# Patient Record
Sex: Male | Born: 1935
Health system: Southern US, Community
[De-identification: ages and names within clinical notes are randomized; demographics above are authoritative.]

## PROBLEM LIST (undated history)

## (undated) DIAGNOSIS — N401 Enlarged prostate with lower urinary tract symptoms: Secondary | ICD-10-CM

## (undated) DIAGNOSIS — C801 Malignant (primary) neoplasm, unspecified: Secondary | ICD-10-CM

## (undated) DIAGNOSIS — Z87442 Personal history of urinary calculi: Secondary | ICD-10-CM

## (undated) DIAGNOSIS — E785 Hyperlipidemia, unspecified: Secondary | ICD-10-CM

## (undated) DIAGNOSIS — R338 Other retention of urine: Secondary | ICD-10-CM

## (undated) DIAGNOSIS — M1990 Unspecified osteoarthritis, unspecified site: Secondary | ICD-10-CM

## (undated) DIAGNOSIS — I10 Essential (primary) hypertension: Secondary | ICD-10-CM

## (undated) DIAGNOSIS — N189 Chronic kidney disease, unspecified: Secondary | ICD-10-CM

## (undated) HISTORY — PX: EYE SURGERY: SHX253

## (undated) HISTORY — PX: TONSILLECTOMY: SUR1361

## (undated) HISTORY — PX: SKIN CANCER EXCISION: SHX779

---

## 2002-09-24 ENCOUNTER — Ambulatory Visit (HOSPITAL_COMMUNITY): Admission: RE | Admit: 2002-09-24 | Discharge: 2002-09-24 | Payer: Self-pay | Admitting: Gastroenterology

## 2002-09-24 ENCOUNTER — Encounter (INDEPENDENT_AMBULATORY_CARE_PROVIDER_SITE_OTHER): Payer: Self-pay | Admitting: Specialist

## 2005-02-10 ENCOUNTER — Emergency Department (HOSPITAL_COMMUNITY): Admission: EM | Admit: 2005-02-10 | Discharge: 2005-02-10 | Payer: Self-pay | Admitting: Emergency Medicine

## 2005-09-18 ENCOUNTER — Emergency Department (HOSPITAL_COMMUNITY): Admission: EM | Admit: 2005-09-18 | Discharge: 2005-09-18 | Payer: Self-pay | Admitting: Emergency Medicine

## 2005-09-22 ENCOUNTER — Emergency Department (HOSPITAL_COMMUNITY): Admission: EM | Admit: 2005-09-22 | Discharge: 2005-09-22 | Payer: Self-pay | Admitting: Emergency Medicine

## 2005-09-24 ENCOUNTER — Ambulatory Visit (HOSPITAL_COMMUNITY): Admission: RE | Admit: 2005-09-24 | Discharge: 2005-09-24 | Payer: Self-pay | Admitting: Urology

## 2005-11-17 ENCOUNTER — Emergency Department (HOSPITAL_COMMUNITY): Admission: EM | Admit: 2005-11-17 | Discharge: 2005-11-17 | Payer: Self-pay | Admitting: Emergency Medicine

## 2005-11-20 ENCOUNTER — Ambulatory Visit (HOSPITAL_BASED_OUTPATIENT_CLINIC_OR_DEPARTMENT_OTHER): Admission: RE | Admit: 2005-11-20 | Discharge: 2005-11-20 | Payer: Self-pay | Admitting: Urology

## 2006-10-23 ENCOUNTER — Observation Stay (HOSPITAL_COMMUNITY): Admission: EM | Admit: 2006-10-23 | Discharge: 2006-10-24 | Payer: Self-pay | Admitting: Emergency Medicine

## 2006-10-23 ENCOUNTER — Encounter (INDEPENDENT_AMBULATORY_CARE_PROVIDER_SITE_OTHER): Payer: Self-pay | Admitting: General Surgery

## 2008-05-27 ENCOUNTER — Emergency Department (HOSPITAL_COMMUNITY): Admission: EM | Admit: 2008-05-27 | Discharge: 2008-05-27 | Payer: Self-pay | Admitting: Emergency Medicine

## 2008-12-12 ENCOUNTER — Other Ambulatory Visit: Payer: Self-pay | Admitting: Internal Medicine

## 2008-12-12 ENCOUNTER — Other Ambulatory Visit: Payer: Self-pay | Admitting: Cardiology

## 2008-12-12 ENCOUNTER — Inpatient Hospital Stay (HOSPITAL_COMMUNITY): Admission: EM | Admit: 2008-12-12 | Discharge: 2008-12-14 | Payer: Self-pay | Admitting: Emergency Medicine

## 2008-12-13 ENCOUNTER — Other Ambulatory Visit: Payer: Self-pay | Admitting: Internal Medicine

## 2008-12-14 ENCOUNTER — Other Ambulatory Visit: Payer: Self-pay | Admitting: Internal Medicine

## 2010-07-07 LAB — CK TOTAL AND CKMB (NOT AT ARMC)
CK, MB: 1.1 ng/mL (ref 0.3–4.0)
Relative Index: INVALID (ref 0.0–2.5)
Total CK: 46 U/L (ref 7–232)

## 2010-07-07 LAB — CBC
HCT: 45.2 % (ref 39.0–52.0)
HCT: 48.9 % (ref 39.0–52.0)
Hemoglobin: 16.9 g/dL (ref 13.0–17.0)
Hemoglobin: 15 g/dL (ref 13.0–17.0)
Hemoglobin: 15.6 g/dL (ref 13.0–17.0)
MCHC: 34.5 g/dL (ref 30.0–36.0)
MCHC: 34.3 g/dL (ref 30.0–36.0)
MCV: 89 fL (ref 78.0–100.0)
Platelets: 186 10*3/uL (ref 150–400)
Platelets: 209 10*3/uL (ref 150–400)
RBC: 5.12 MIL/uL (ref 4.22–5.81)
RBC: 5.5 MIL/uL (ref 4.22–5.81)
RBC: 4.88 MIL/uL (ref 4.22–5.81)
RBC: 5.05 MIL/uL (ref 4.22–5.81)
RDW: 13.7 % (ref 11.5–15.5)
RDW: 13.8 % (ref 11.5–15.5)
WBC: 10.3 10*3/uL (ref 4.0–10.5)
WBC: 12.1 10*3/uL — ABNORMAL HIGH (ref 4.0–10.5)

## 2010-07-07 LAB — DIFFERENTIAL
Basophils Absolute: 0.1 10*3/uL (ref 0.0–0.1)
Basophils Absolute: 0 10*3/uL (ref 0.0–0.1)
Basophils Relative: 1 % (ref 0–1)
Basophils Relative: 0 % (ref 0–1)
Eosinophils Absolute: 0.2 10*3/uL (ref 0.0–0.7)
Eosinophils Absolute: 0 10*3/uL (ref 0.0–0.7)
Eosinophils Relative: 2 % (ref 0–5)
Eosinophils Relative: 0 % (ref 0–5)
Lymphocytes Relative: 42 % (ref 12–46)
Lymphs Abs: 4.3 10*3/uL — ABNORMAL HIGH (ref 0.7–4.0)
Lymphs Abs: 2.4 10*3/uL (ref 0.7–4.0)
Monocytes Absolute: 0.9 10*3/uL (ref 0.1–1.0)
Monocytes Relative: 9 % (ref 3–12)
Neutro Abs: 4.8 10*3/uL (ref 1.7–7.7)
Neutrophils Relative %: 47 % (ref 43–77)
Neutrophils Relative %: 74 % (ref 43–77)

## 2010-07-07 LAB — POCT I-STAT, CHEM 8
BUN: 15 mg/dL (ref 6–23)
Calcium, Ion: 1.08 mmol/L — ABNORMAL LOW (ref 1.12–1.32)
Chloride: 108 mEq/L (ref 96–112)
Creatinine, Ser: 1 mg/dL (ref 0.4–1.5)
Glucose, Bld: 101 mg/dL — ABNORMAL HIGH (ref 70–99)
HCT: 51 % (ref 39.0–52.0)
Hemoglobin: 17.3 g/dL — ABNORMAL HIGH (ref 13.0–17.0)
Potassium: 3.8 mEq/L (ref 3.5–5.1)
Sodium: 139 mEq/L (ref 135–145)
TCO2: 21 mmol/L (ref 0–100)

## 2010-07-07 LAB — COMPREHENSIVE METABOLIC PANEL
ALT: 29 U/L (ref 0–53)
AST: 40 U/L — ABNORMAL HIGH (ref 0–37)
Alkaline Phosphatase: 80 U/L (ref 39–117)
CO2: 22 mEq/L (ref 19–32)
Calcium: 8.9 mg/dL (ref 8.4–10.5)
Chloride: 106 mEq/L (ref 96–112)
GFR calc Af Amer: >60 mL/min (ref >60)
GFR calc non Af Amer: >60 mL/min (ref >60)
Glucose, Bld: 115 mg/dL — ABNORMAL HIGH (ref 70–99)
Sodium: 137 mEq/L (ref 135–145)
Total Bilirubin: 1.3 mg/dL — ABNORMAL HIGH (ref 0.3–1.2)

## 2010-07-07 LAB — PROTIME-INR
INR: 1.1 (ref 0.00–1.49)
INR: 1.1 (ref 0.00–1.49)
Prothrombin Time: 13.9 seconds (ref 11.6–15.2)
Prothrombin Time: 14.4 seconds (ref 11.6–15.2)

## 2010-07-07 LAB — LIPID PANEL
Cholesterol: 158 mg/dL (ref 0–200)
Cholesterol: 159 mg/dL (ref 0–200)
HDL: 36 mg/dL — ABNORMAL LOW (ref >39)
Total CHOL/HDL Ratio: 3.9 RATIO
Triglycerides: 118 mg/dL (ref <150)

## 2010-07-07 LAB — BASIC METABOLIC PANEL
CO2: 25 mEq/L (ref 19–32)
Calcium: 9.1 mg/dL (ref 8.4–10.5)
Chloride: 111 mEq/L (ref 96–112)
Creatinine, Ser: 1.01 mg/dL (ref 0.4–1.5)
GFR calc Af Amer: >60 mL/min (ref >60)
GFR calc Af Amer: >60 mL/min (ref >60)
GFR calc non Af Amer: >60 mL/min (ref >60)
Glucose, Bld: 111 mg/dL — ABNORMAL HIGH (ref 70–99)
Sodium: 139 mEq/L (ref 135–145)
Sodium: 137 mEq/L (ref 135–145)

## 2010-07-07 LAB — POCT CARDIAC MARKERS
CKMB, poc: <1 ng/mL — ABNORMAL LOW (ref 1.0–8.0)
Myoglobin, poc: 60.9 ng/mL (ref 12–200)
Troponin i, poc: <0.05 ng/mL (ref 0.00–0.09)

## 2010-07-07 LAB — CARDIAC PANEL(CRET KIN+CKTOT+MB+TROPI)
Relative Index: INVALID (ref 0.0–2.5)
Total CK: 42 U/L (ref 7–232)
Troponin I: 0.16 ng/mL — ABNORMAL HIGH (ref 0.00–0.06)

## 2010-07-07 LAB — TSH: TSH: 4.851 u[IU]/mL — ABNORMAL HIGH (ref 0.350–4.500)

## 2010-07-07 LAB — HEPARIN LEVEL (UNFRACTIONATED): Heparin Unfractionated: 0.56 IU/mL (ref 0.30–0.70)

## 2010-07-07 LAB — APTT: aPTT: 28 seconds (ref 24–37)

## 2010-07-07 LAB — TROPONIN I: Troponin I: 0.13 ng/mL — ABNORMAL HIGH (ref 0.00–0.06)

## 2010-07-07 LAB — BRAIN NATRIURETIC PEPTIDE: Pro B Natriuretic peptide (BNP): 52 pg/mL (ref 0.0–100.0)

## 2010-07-07 LAB — MAGNESIUM: Magnesium: 2 mg/dL (ref 1.5–2.5)

## 2010-07-18 LAB — URINE CULTURE: Colony Count: NO GROWTH

## 2010-07-18 LAB — URINALYSIS, ROUTINE W REFLEX MICROSCOPIC
Nitrite: NEGATIVE
Protein, ur: 100 mg/dL — AB
pH: 5 (ref 5.0–8.0)

## 2010-07-18 LAB — URINE MICROSCOPIC-ADD ON

## 2010-08-15 NOTE — H&P (Signed)
Glenn Patel, Glenn Patel                ACCOUNT NO.:  [id number]   MEDICAL RECORD NO.:  [id number]          PATIENT TYPE:  INP   LOCATION:  0098                         FACILITY:  [facility]   PHYSICIAN:  Angelia Mould. Derrell Lolling, M.D.DATE OF BIRTH:  1933-10-22   DATE OF ADMISSION:  10/23/2006  DATE OF DISCHARGE:                              HISTORY & PHYSICAL   CHIEF COMPLAINT:  Right-sided abdominal pain.   HISTORY OF PRESENT ILLNESS:  This is a 77 year old white man in  generally good health.  He presents to the emergency room with a 3-day  history of steady right-sided abdominal pain and nausea.  He has not had  any vomiting.  He denies fever or chills.  He denies any prior problems.  His bowel movements have been basically normal although he had one small  looser stool today with no bloody in the stools.  He denies prior  similar problems.   It is notable that in 2007 he had a CT urogram which showed an  obstructing right ureteral stone, but also was noted to have gallstones  at that time.  He had an ultrasound in the ER today which shows  gallstones, but there is no significant wall thickening, no free fluid  around the gallbladder and the common bile duct is normal at 6 mm.  I  was asked to see him by Dr. Carleene Cooper with concern for acute  cholecystitis.   PAST HISTORY:  1. Right ureteral kidney stone requiring ureteroscopy by Dr. Larey Dresser in 2007.  2. Colonoscopy 2004 and 2007 for colon polyps by Dr. Wandalee Ferdinand.  3. Tonsillectomy remote.  4. No other medical or surgical problems.   CURRENT MEDICATIONS:  None.   DRUG ALLERGIES:  None known.   FAMILY HISTORY:  Father died age 25 sudden death.  Mother died age 56  colon cancer.   SOCIAL HISTORY:  The patient is married, lives in [place], has three  children.  He is retired.  He used to be the owner of [employer].  He still smokes about one-half pack of cigarettes per day.  Drinks  alcohol once a week.   REVIEW  OF SYSTEMS:  A 15 system review of systems is performed and is  noncontributory except as described above.   PHYSICAL EXAMINATION:  GENERAL APPEARANCE:  Pleasant older gentleman in  mild distress.  He has received intravenous analgesics.  VITAL SIGNS:  Temperature 97.7, pulse 77, respirations 20, blood  pressure 140/97.  EYES:  Sclerae clear.  Extraocular movements intact.  EARS, NOSE, MOUTH, AND THROAT:  Nose, lips, tongue and oropharynx are  without gross lesions.  NECK:  Supple, nontender.  I do not feel a mass.  I do not see jugular  venous distention.  CHEST:  Lungs clear to auscultation.  No chest wall tenderness.  No  chest wall tenderness.  Specifically he is nontender to percuss the  right costal margin.  HEART:  Regular rate and rhythm.  No murmurs.  ABDOMEN:  Borderline obese, soft.  He is tender on the right side.  It  actually seems a little bit lower than the typical gallbladder pain,  could be a low-lying gallbladder or could be appendicitis.  I do not  feel a mass.  He is tender to percuss.  Liver and spleen are not  enlarged.  I do not see any scars or hernias.  GENITOURINARY:  Penis,  scrotum, and testes are grossly normal.  I do not feel any inguinal  hernia.  EXTREMITIES:  He moves all four extremities well without pain or  deformity.  NEUROLOGIC: No gross motor or sensory deficits.   ADMISSION DATA:  Ultrasound done today as described above.  CT scan done  more than a year ago as described above.  Hemoglobin 16.0, white blood  cell count 9600, white count differential is actually normal with 61  neutrophils, 29 lymphocytes and 8 monocytes.  Complete metabolic panel,  specifically all liver function tests are normal.  Serum lipase is 21.  Urinalysis is normal.   ASSESSMENT:  Right-sided abdominal pain.  It is not clear whether this  is acute cholecystitis or some other inflammatory process such as  appendicitis.  The location of his tenderness is ambiguous and  the  ultrasound although it shows gallstones is not impressive for an  inflammatory process.   PLAN:  1. The patient will be admitted and started on intravenous      antibiotics.  2. We will proceed with a CT scan of the abdomen and pelvis now to see      if we can differentiate what is going on.  I suspect that he will      eventually come to the laparoscopy.      Angelia Mould. Derrell Lolling, M.D.  Electronically Signed     HMI/MEDQ  D:  10/23/2006  T:  10/24/2006  Job:  [id number]   cc:   Oley Balm. Georgina Pillion, M.D.  Fax: [phone]

## 2010-08-15 NOTE — Op Note (Signed)
NAMEODEAN, FESTER                ACCOUNT NO.:  [id number]   MEDICAL RECORD NO.:  [id number]          PATIENT TYPE:  OBV   LOCATION:  0098                         FACILITY:  [facility]   PHYSICIAN:  Angelia Mould. Derrell Lolling, M.D.DATE OF BIRTH:  January 10, 1934   DATE OF PROCEDURE:  10/23/2006  DATE OF DISCHARGE:  10/24/2006                               OPERATIVE REPORT   PREOPERATIVE DIAGNOSIS:  Acute appendicitis.   POSTOPERATIVE DIAGNOSIS:  Acute appendicitis.   OPERATION PERFORMED:  Laparoscopic appendectomy.   SURGEON:  Dr. Claud Kelp.   OPERATIVE INDICATIONS:  This is a 77 year old white man who presented to  the [facility] emergency room complaining a three-day history of steady  right-sided abdominal pain and nausea.  There were no voiding symptoms  and no alterations of his bowel habits and no fever.  He was known to  have gallstones from a CT scan 1 year ago but has not had any symptoms  of biliary colic in the past.  An ultrasound was done and it confirmed  the gallstones but there was no real inflammatory process.  I was called  to see him at that point and I felt that his pain was more in the right  lower quadrant.  A CT scan was then obtained which confirmed acute  appendicitis but no evidence of rupture.  He is brought to operating  room with preoperative diagnosis of acute appendicitis.   OPERATIVE FINDINGS:  The patient had acute appendicitis.  The appendix  came off the cecum medially and extended down toward the pelvis but was  mobilized just by breaking up some soft acute adhesions.  We could see  the terminal ileum and ileocecal valve and the right colon quite well.  Sigmoid colon was large and floppy and got in the way several times but  we were able to mobilize this out of the way.  The liver looked normal.  I really could not see the gallbladder as the omentum was chronically  adherent to the liver.  There did not appear to be any acute  inflammatory process in the  right upper quadrant.   OPERATIVE TECHNIQUE:  Following induction of general endotracheal  anesthesia, Foley catheter was inserted.  Intravenous antibiotics had  already been given.  The patient was identified as to correct patient  and correct procedure.  The abdomen was prepped and draped in a sterile  fashion.  0.5% Marcaine with epinephrine was used as local infiltration  anesthetic.  The short vertical incision was made inside the upper rim  of the umbilicus.  The fascia was incised in the midline.  The abdominal  cavity entered under direct vision.  A 10 mm Hasson trocar was inserted  and secured the pursestring suture of 0-0 Vicryl.  Pneumoperitoneum was  created.  Angled scope was inserted with visualization and findings as  described above.  The patient was positioned in Trendelenburg position  and rotated to the left.  A 5-mm trocar was placed in the left lower  quadrant and a 12-mm trocar placed in the suprapubic area.  We mobilized  the  sigmoid colon out of the way.  We ran the small bowel back to the  ileocecal valve.  We could see the base of the appendix and with slow  soft dissection we were able to bluntly mobilize the tip of the appendix  up and then we could see the appendix, the appendiceal mesentery and the  insertion of the appendix onto the cecum.  The appendiceal mesentery was  divided with harmonic scalpel.  We had some arterial bleeding from the  appendiceal mesentery which was controlled with a metal clips and it was  completely hemostatic for the next 20 minutes throughout the remainder  of the case.  It was completely hemostatic at the end of the case.  We  continued to skeletonize the appendiceal mesentery until we had it  completely taken away and could clearly see the junction of the appendix  with the cecum.  We had used an Endo-GIA stapler and placed it  perpendicular across the base of the appendix closed the stapler, held  in place for 30 minutes, fired  it and removed it.  We placed the  appendix in a bag and removed it.  We then irrigated out of the right  lower quadrant.  There was a little bit of blood left over from the  bleeding but there was no active bleeding after we irrigated all the  fluid and out.  The irrigation fluid was completely clear.  We inspected  the staple line and it looked very good.  We inspected the area where we  had controlled the appendiceal artery with the metal clip and that  looked good.  We felt nothing further needed to be done.  The  pneumoperitoneum was released.  The trocars were removed under direct  vision.  There was no bleeding from trocar sites.  The fascia at the  umbilicus was closed zero Vicryl sutures.  The skin incisions were  irrigated with saline and skin closed with subcuticular sutures of 4-0  Monocryl and Steri-Strips.  Clean bandages were placed and the patient  taken to recovery room in stable condition.  Estimated blood loss was  about 30 mL.  Complications none.  sponge, needle and instrument counts  were correct.      Angelia Mould. Derrell Lolling, M.D.  Electronically Signed     HMI/MEDQ  D:  10/23/2006  T:  10/24/2006  Job:  [id number]   cc:   Oley Balm. Georgina Pillion, M.D.  Fax: [phone]

## 2010-08-18 NOTE — Op Note (Signed)
NAMELEM, PEARY                ACCOUNT NO.:  [id number]   MEDICAL RECORD NO.:  [id number]          PATIENT TYPE:  AMB   LOCATION:  NESC                         FACILITY:  [facility]   PHYSICIAN:  Maretta Bees. Vonita Moss, M.D.DATE OF BIRTH:  03/03/1934   DATE OF PROCEDURE:  11/20/2005  DATE OF DISCHARGE:                                 OPERATIVE REPORT   PREOPERATIVE DIAGNOSIS:  Distal right ureteral calculus.   POSTOPERATIVE DIAGNOSIS:  Distal right ureteral calculus.   PROCEDURE:  Cystoscopy, right ureteroscopy and stone basketing.   SURGEON:  Dr. Larey Dresser.   ANESTHESIA:  General.   INDICATIONS:  This 77 year old gentleman has had right flank pain due to a 7  mm stone in the distal right ureter and underwent treatment with ESL earlier  this summer.  He passed several stone fragments and a KUB was done.  The  stone appeared gone but in retrospect there may have been a small fragment  over the bone to the coccyx.  In any case, he had recurrent flank pain.  A  CT scan in the emergency room showed a residual stone fragment in the distal  right ureter and because of pain and discomfort the patient wanted the stone  removed so he is brought to the OR today for ureteroscopy.   PROCEDURE:  The patient brought to the operating room, placed lithotomy  position.  External genitalia were prepped, draped in usual fashion.  Using  cystoscope, the anterior prostatic urethra were remarkable for prostatic  enlargement.  The bladder was unremarkable except for a bulging intramural  ureter.  A sensor wire was placed up the right ureter and under fluoroscopic  observation the stone moved up the ureter.  Because the orifice looked snug  I inserted the inner sheath of a ureteral access dilating sheath and dilated  the intramural ureter.  I then inserted a 6-French rigid ureteroscope and  visualized the stone which appeared to be definitely smaller than the  original stone treated with ESL.  I was  able to use the nitinol basket and  grasp the stone intact and remove it atraumatically and I later gave the  stone to the patient's wife.  I  reinserted the ureteroscope and the right  ureter was free residual stones.  Since the procedure was done essentially  atraumatically I opted not to leave in a double-J catheter.  He was then  taken to recovery room in good condition having tolerated the procedure  well.      Maretta Bees. Vonita Moss, M.D.  Electronically Signed     LJP/MEDQ  D:  11/20/2005  T:  11/21/2005  Job:  [id number]

## 2010-08-18 NOTE — Op Note (Signed)
   NAME:  Glenn Patel, Glenn Patel                          ACCOUNT NO.:  [id number]   MEDICAL RECORD NO.:  [id number]                   PATIENT TYPE:  AMB   LOCATION:  ENDO                                 FACILITY:  [facility]   PHYSICIAN:  Graylin Shiver, M.D.                DATE OF BIRTH:  Jul 07, 1933   DATE OF PROCEDURE:  09/24/2002  DATE OF DISCHARGE:                                 OPERATIVE REPORT   PROCEDURE:  Colonoscopy with polypectomy.   INDICATIONS FOR PROCEDURE:  Family history of colon polyps  possibly colon  cancer.   Informed consent was obtained after explanation of the risks of bleeding,  infection and perforation.   PREMEDICATION:  Fentanyl 50 mcg IV, Versed 4 mg IV.   DESCRIPTION OF PROCEDURE:  With the patient in the left lateral decubitus  position, a rectal exam was performed and no masses were felt. The Olympus  colonoscope was inserted into the rectum and advanced around the colon to  the cecum. Cecal landmarks were identified. The cecum looked normal. In the  mid ascending colon, there was a small 3 mm sessile polyp removed with cold  forceps. In the proximal transverse colon, there was a small 4 mm polyp  removed with the cold forceps. The descending colon was normal. In the  distal sigmoid, there was a 6 mm sessile polyp which was snared and removed  by snare cautery technique. The cautery site looked good, the polyp was  removed. The rectum looked normal. He tolerated the procedure well without  complications.   IMPRESSION:  Colon polyps.   PLAN:  The pathology will be checked. I would recommend a followup  colonoscopy again in three years.                                               Graylin Shiver, M.D.    SFG/MEDQ  D:  09/24/2002  T:  09/24/2002  Job:  [id number]   cc:   Oley Balm. Georgina Pillion, M.D.  [address]  [place]  [place] [address]  Fax: [phone]

## 2011-01-15 LAB — CBC
HCT: 46.2
Hemoglobin: 16
MCHC: 34.7
MCV: 84.8
Platelets: 206
RBC: 5.45
RDW: 14.9 — ABNORMAL HIGH
WBC: 9.6

## 2011-01-15 LAB — DIFFERENTIAL
Eosinophils Relative: 1
Lymphocytes Relative: 29
Monocytes Absolute: 0.7
Monocytes Relative: 8
Neutro Abs: 5.8

## 2011-01-15 LAB — URINALYSIS, ROUTINE W REFLEX MICROSCOPIC
Glucose, UA: NEGATIVE
Hgb urine dipstick: NEGATIVE
Specific Gravity, Urine: 1.029
pH: 5

## 2011-01-15 LAB — COMPREHENSIVE METABOLIC PANEL
AST: 24
Albumin: 3.7
BUN: 11
Creatinine, Ser: 1.18
GFR calc Af Amer: >60
Potassium: 3.8
Total Protein: 6.7

## 2011-01-15 LAB — LIPASE, BLOOD: Lipase: 21

## 2014-02-21 ENCOUNTER — Encounter (HOSPITAL_COMMUNITY): Payer: Self-pay | Admitting: *Deleted

## 2014-02-21 ENCOUNTER — Emergency Department (HOSPITAL_COMMUNITY): Payer: Medicare Other

## 2014-02-21 ENCOUNTER — Inpatient Hospital Stay (HOSPITAL_COMMUNITY)
Admission: EM | Admit: 2014-02-21 | Discharge: 2014-02-24 | DRG: 872 | Disposition: A | Discharge status: Home / Self Care | Payer: Medicare Other | Attending: Internal Medicine | Admitting: Internal Medicine

## 2014-02-21 DIAGNOSIS — E785 Hyperlipidemia, unspecified: Secondary | ICD-10-CM

## 2014-02-21 DIAGNOSIS — E876 Hypokalemia: Secondary | ICD-10-CM | POA: Diagnosis present

## 2014-02-21 DIAGNOSIS — D72829 Elevated white blood cell count, unspecified: Secondary | ICD-10-CM | POA: Diagnosis present

## 2014-02-21 DIAGNOSIS — Z79899 Other long term (current) drug therapy: Secondary | ICD-10-CM

## 2014-02-21 DIAGNOSIS — A419 Sepsis, unspecified organism: Principal | ICD-10-CM | POA: Diagnosis present

## 2014-02-21 DIAGNOSIS — I10 Essential (primary) hypertension: Secondary | ICD-10-CM | POA: Diagnosis present

## 2014-02-21 DIAGNOSIS — R339 Retention of urine, unspecified: Secondary | ICD-10-CM | POA: Diagnosis present

## 2014-02-21 DIAGNOSIS — B9620 Unspecified Escherichia coli [E. coli] as the cause of diseases classified elsewhere: Secondary | ICD-10-CM | POA: Diagnosis present

## 2014-02-21 DIAGNOSIS — N390 Urinary tract infection, site not specified: Secondary | ICD-10-CM | POA: Diagnosis present

## 2014-02-21 DIAGNOSIS — I9589 Other hypotension: Secondary | ICD-10-CM

## 2014-02-21 DIAGNOSIS — N401 Enlarged prostate with lower urinary tract symptoms: Secondary | ICD-10-CM | POA: Diagnosis present

## 2014-02-21 DIAGNOSIS — R509 Fever, unspecified: Secondary | ICD-10-CM | POA: Diagnosis present

## 2014-02-21 DIAGNOSIS — E869 Volume depletion, unspecified: Secondary | ICD-10-CM | POA: Diagnosis present

## 2014-02-21 DIAGNOSIS — Z7902 Long term (current) use of antithrombotics/antiplatelets: Secondary | ICD-10-CM

## 2014-02-21 DIAGNOSIS — I959 Hypotension, unspecified: Secondary | ICD-10-CM | POA: Diagnosis present

## 2014-02-21 HISTORY — DX: Hyperlipidemia, unspecified: E78.5

## 2014-02-21 HISTORY — DX: Other retention of urine: N40.1

## 2014-02-21 HISTORY — DX: Other retention of urine: R33.8

## 2014-02-21 HISTORY — DX: Essential (primary) hypertension: I10

## 2014-02-21 LAB — BASIC METABOLIC PANEL
Anion gap: 18 — ABNORMAL HIGH (ref 5–15)
BUN: 17 mg/dL (ref 6–23)
CO2: 14 meq/L — AB (ref 19–32)
Calcium: 9.4 mg/dL (ref 8.4–10.5)
Chloride: 101 mEq/L (ref 96–112)
Creatinine, Ser: 1.27 mg/dL (ref 0.50–1.35)
GFR calc Af Amer: 61 mL/min — ABNORMAL LOW (ref >90)
GFR calc non Af Amer: 52 mL/min — ABNORMAL LOW (ref >90)
GLUCOSE: 131 mg/dL — AB (ref 70–99)
POTASSIUM: 3.3 meq/L — AB (ref 3.7–5.3)
Sodium: 133 mEq/L — ABNORMAL LOW (ref 137–147)

## 2014-02-21 LAB — CBC WITH DIFFERENTIAL/PLATELET
Basophils Absolute: 0 10*3/uL (ref 0.0–0.1)
Basophils Relative: 0 % (ref 0–1)
EOS PCT: 0 % (ref 0–5)
Eosinophils Absolute: 0 10*3/uL (ref 0.0–0.7)
HCT: 42.1 % (ref 39.0–52.0)
HEMOGLOBIN: 14.8 g/dL (ref 13.0–17.0)
LYMPHS ABS: 1 10*3/uL (ref 0.7–4.0)
LYMPHS PCT: 5 % — AB (ref 12–46)
MCH: 29.5 pg (ref 26.0–34.0)
MCHC: 35.2 g/dL (ref 30.0–36.0)
MCV: 83.9 fL (ref 78.0–100.0)
Monocytes Absolute: 2.2 10*3/uL — ABNORMAL HIGH (ref 0.1–1.0)
Monocytes Relative: 12 % (ref 3–12)
NEUTROS PCT: 83 % — AB (ref 43–77)
Neutro Abs: 15.6 10*3/uL — ABNORMAL HIGH (ref 1.7–7.7)
PLATELETS: 166 10*3/uL (ref 150–400)
RBC: 5.02 MIL/uL (ref 4.22–5.81)
RDW: 13.9 % (ref 11.5–15.5)
WBC: 18.8 10*3/uL — AB (ref 4.0–10.5)

## 2014-02-21 LAB — I-STAT CG4 LACTIC ACID, ED: Lactic Acid, Venous: 0.88 mmol/L (ref 0.5–2.2)

## 2014-02-21 LAB — URINALYSIS, ROUTINE W REFLEX MICROSCOPIC
Glucose, UA: NEGATIVE mg/dL
Ketones, ur: NEGATIVE mg/dL
NITRITE: POSITIVE — AB
PH: 5.5 (ref 5.0–8.0)
Protein, ur: 100 mg/dL — AB
SPECIFIC GRAVITY, URINE: 1.024 (ref 1.005–1.030)
Urobilinogen, UA: 0.2 mg/dL (ref 0.0–1.0)

## 2014-02-21 LAB — URINE MICROSCOPIC-ADD ON

## 2014-02-21 MED ORDER — ACETAMINOPHEN 325 MG PO TABS
650.0000 mg | ORAL_TABLET | Freq: Once | ORAL | Status: AC
  Administered 2014-02-21: 650 mg via ORAL
  Filled 2014-02-21: qty 2

## 2014-02-21 MED ORDER — SODIUM CHLORIDE 0.9 % IV BOLUS (SEPSIS)
1000.0000 mL | Freq: Once | INTRAVENOUS | Status: AC
  Administered 2014-02-21: 1000 mL via INTRAVENOUS

## 2014-02-21 MED ORDER — SODIUM CHLORIDE 0.9 % IV SOLN
Freq: Once | INTRAVENOUS | Status: AC
  Administered 2014-02-21: 19:00:00 via INTRAVENOUS

## 2014-02-21 MED ORDER — DEXTROSE 5 % IV SOLN
1.0000 g | Freq: Once | INTRAVENOUS | Status: AC
  Administered 2014-02-21: 1 g via INTRAVENOUS
  Filled 2014-02-21: qty 10

## 2014-02-21 MED ORDER — DEXTROSE 5 % IV SOLN
1.0000 g | INTRAVENOUS | Status: DC
  Administered 2014-02-22 – 2014-02-23 (×2): 1 g via INTRAVENOUS
  Filled 2014-02-21 (×2): qty 10

## 2014-02-21 NOTE — H&P (Signed)
[facility] Admission History and Physical       Glenn Patel LZJ:[id number] DOB: 04-16-1933 DOA: 02/21/2014  Referring physician: EDP PCP: Lujean Amel, MD  Specialists:   Chief Complaint:  Confusion  HPI: Glenn Patel is a 80 y.o. male with a history of HTN, Hyperlipidemia, Urinary Retention, BPH who presents to the Ed with complaints of fevers and chills and confusion today, and malaise and dysuria  For the past 4-5 days.  He also had has increased weakness and decreased appetite.   He straight caths himself due to urinary retention and reports that he had decreased urinary output as well for the past day.  He was evaluated in the ED and was found to have a UTI and hypotension and culture were sent and he was administered IVFs and IV Rocephin and referred for admission.       Review of Systems:  Constitutional: No Weight Loss, No Weight Gain, Night Sweats, +Fevers, +Chills, Dizziness, Fatigue, +Generalized Weakness HEENT: No Headaches, Difficulty Swallowing,Tooth/Dental Problems,Sore Throat,  No Sneezing, Rhinitis, Ear Ache, Nasal Congestion, or Post Nasal Drip,  Cardio-vascular:  No Chest pain, Orthopnea, PND, Edema in Lower Extremities, Anasarca, Dizziness, Palpitations  Resp: No Dyspnea, No DOE, No Cough, No Hemoptysis, No Wheezing.    GI: No Heartburn, Indigestion, Abdominal Pain, Nausea, Vomiting, Diarrhea, Hematemesis, Hematochezia, Melena, Change in Bowel Habits,  +Loss of Appetite  GU:  +Dysuria, Change in Color of Urine, No Urgency or Frequency, No Flank pain.  Musculoskeletal: No Joint Pain or Swelling, No Decreased Range of Motion, No Back Pain.  Neurologic: No Syncope, No Seizures, Muscle Weakness, Paresthesia, Vision Disturbance or Loss, No Diplopia, No Vertigo, No Difficulty Walking,  Skin: No Rash or Lesions. Psych: No Change in Mood or Affect, No Depression or Anxiety, No Memory loss, No Confusion, or Hallucinations   Past Medical History  Diagnosis  Date  . Hypertension   . Hyperlipidemia   . Urinary retention due to benign prostatic hyperplasia       History reviewed. No pertinent past surgical history.     Prior to Admission medications   Medication Sig Start Date End Date Taking? Authorizing Provider  clopidogrel (PLAVIX) 75 MG tablet Take 75 mg by mouth every morning.    Yes Historical Provider, MD  dorzolamide-timolol (COSOPT) 22.3-6.8 MG/ML ophthalmic solution Place 1 drop into both eyes 2 (two) times daily.  01/24/14  Yes Historical Provider, MD  finasteride (PROSCAR) 5 MG tablet Take 5 mg by mouth every morning.    Yes Historical Provider, MD  isosorbide mononitrate (IMDUR) 60 MG 24 hr tablet Take 60 mg by mouth every morning.    Yes Historical Provider, MD  latanoprost (XALATAN) 0.005 % ophthalmic solution Place 1 drop into both eyes 2 (two) times daily.  02/04/14  Yes Historical Provider, MD  losartan (COZAAR) 100 MG tablet Take 100 mg by mouth every morning.    Yes Historical Provider, MD  simvastatin (ZOCOR) 40 MG tablet Take 40 mg by mouth every morning.    Yes Historical Provider, MD  tamsulosin (FLOMAX) 0.4 MG CAPS capsule Take 0.4 mg by mouth every morning.    Yes Historical Provider, MD  triamcinolone cream (KENALOG) 0.1 % Apply 1 application topically 2 (two) times daily as needed (psoriasis).  12/14/13  Yes Historical Provider, MD      No Known Allergies   Social History:  reports that he has never smoked. He does not have any smokeless tobacco history on file. He reports that he  does not drink alcohol or use illicit drugs.     History reviewed. No pertinent family history.     Physical Exam:  GEN:  Pleasant Elderly Obese 80 y.o. Caucasian male examined  and in no acute distress; cooperative with exam Filed Vitals:   02/21/14 1842 02/21/14 1906 02/21/14 1908 02/21/14 2100  BP:  98/57  99/57  Pulse:  76  62  Temp: 99.3 F (37.4 C)  99.7 F (37.6 C) 97.7 F (36.5 C)  TempSrc: Oral  Oral Oral  Resp:   20  20  SpO2:  98%  98%   Blood pressure 99/57, pulse 62, temperature 97.7 F (36.5 C), temperature source Oral, resp. rate 20, SpO2 98 %. PSYCH: He is alert and oriented x4; does not appear anxious does not appear depressed; affect is normal HEENT: Normocephalic and Atraumatic, Mucous membranes pink; PERRLA; EOM intact; Fundi:  Benign;  No scleral icterus, Nares: Patent, Oropharynx: Clear, Fair Dentition,    Neck:  FROM, No Cervical Lymphadenopathy nor Thyromegaly or Carotid Bruit; No JVD; Breasts:: Not examined CHEST WALL: No tenderness CHEST: Normal respiration, clear to auscultation bilaterally HEART: Regular rate and rhythm; no murmurs rubs or gallops BACK: No kyphosis or scoliosis; No CVA tenderness ABDOMEN: Positive Bowel Sounds, Obese, Soft Non-Tender; No Masses, No Organomegaly. Rectal Exam: Not done EXTREMITIES: No Cyanosis, Clubbing, or Edema; No Ulcerations. Genitalia: not examined PULSES: 2+ and symmetric SKIN: Normal hydration no rash or ulceration CNS:  Alert and Oriented x 4,  No Focal Deficits Vascular: pulses palpable throughout    Labs on Admission:  Basic Metabolic Panel:  Recent Labs Lab 02/21/14 1738  NA 133*  K 3.3*  CL 101  CO2 14*  GLUCOSE 131*  BUN 17  CREATININE 1.27  CALCIUM 9.4   Liver Function Tests: No results for input(s): AST, ALT, ALKPHOS, BILITOT, PROT, ALBUMIN in the last 168 hours. No results for input(s): LIPASE, AMYLASE in the last 168 hours. No results for input(s): AMMONIA in the last 168 hours. CBC:  Recent Labs Lab 02/21/14 1738  WBC 18.8*  NEUTROABS 15.6*  HGB 14.8  HCT 42.1  MCV 83.9  PLT 166   Cardiac Enzymes: No results for input(s): CKTOTAL, CKMB, CKMBINDEX, TROPONINI in the last 168 hours.  BNP (last 3 results) No results for input(s): PROBNP in the last 8760 hours. CBG: No results for input(s): GLUCAP in the last 168 hours.  Radiological Exams on Admission: Dg Chest 2 View  02/21/2014   CLINICAL DATA:   Fever, lethargy.  Smoker.  EXAM: CHEST  2 VIEW  COMPARISON:  12/11/2008  FINDINGS: Aortic tortuosity. Scattered atherosclerosis. Heart size normal range. Mild right apical thickening/scarring. Interstitial prominence. Mild increased AP diameter on the lateral view. Mild lingular/left lung base opacity. No pleural effusion or pneumothorax. No acute osseous finding. Multilevel degenerative change.  IMPRESSION: Mild interstitial prominence may reflect chronic change in the setting of smoking or atypical/viral infection if acute.  Mild lingular/left lung base opacity, favor atelectasis.   Electronically Signed   By: Carlos Levering M.D.   On: 02/21/2014 18:20       Assessment/Plan:   80 y.o. male with  Principal Problem:   1.   Sepsis- due to UTI   IV Rocephin   Active Problems:   2.   UTI (lower urinary tract infection)   Urine C+S   IV Rocephin     3.   Hypotension   IVFs   Hold Losartan and Imdur Rx  4.   Urinary retention   Straight Cath PRN   Continue Flomax and Proscar     5.   Hypokalemia   Replete K+   Check Magnesium     6.   Hyperlipidemia        7.   DVT Prophylaxis   Lovenox    Code Status:   FULLCODE Family Communication:  Wife and Daughter at Bedside  Disposition Plan:   Inpatinent   Time spent:  35 Minutes  Theressa Millard [facility] Pager [phone]   If [place] Please Contact the Day Rounding Team MD for [facility]  If 7PM-7AM, Please Contact Night-Floor Coverage  [email or web address] Password [facility] 02/21/2014, 9:42 PM

## 2014-02-21 NOTE — ED Notes (Signed)
Informed patient and family member about the wait for an assigned room. Pt appears comfortable, talking with family members at the bedside.

## 2014-02-21 NOTE — ED Notes (Signed)
Informed patient of need for urine specimen Patient states that he would like to try using urinal before in and out cath is performed Urinal given to patient

## 2014-02-21 NOTE — ED Provider Notes (Signed)
CSN: [id number]     Arrival date & time 02/21/14  1653 History   First MD Initiated Contact with Patient 02/21/14 1705     Chief Complaint  Patient presents with  . Urinary Retention     HPI  Patient presents for evaluation of confusion at home. Patient states he has not felt well for 3-4 days. He has a history of urinary retention and occasional have to self cath. However he states this is an infrequent occurrence. His wife states that his urine output decreased on Tuesday, 5 days ago. He attempted self cath on Wednesday and Thursday got no urine output. He admits that is at a poor appetite and hasn't been eating or drinking much over that time.Marland Kitchen He's had episodes of chills. No shortness of breath. He states that he feels the need to empty his bladder but cannot. Short of breath. No falls or injury. Poor appetite but no nausea vomiting.  Past Medical History  Diagnosis Date  . Hypertension   . Hyperlipidemia   . Urinary retention due to benign prostatic hyperplasia    History reviewed. No pertinent past surgical history. History reviewed. No pertinent family history. History  Substance Use Topics  . Smoking status: Never Smoker   . Smokeless tobacco: Not on file  . Alcohol Use: No    Review of Systems  Constitutional: Positive for fever, chills, activity change, appetite change and fatigue. Negative for diaphoresis.  HENT: Negative for mouth sores, sore throat and trouble swallowing.   Eyes: Negative for visual disturbance.  Respiratory: Negative for cough, chest tightness, shortness of breath and wheezing.   Cardiovascular: Negative for chest pain.  Gastrointestinal: Negative for nausea, vomiting, abdominal pain, diarrhea and abdominal distention.  Endocrine: Negative for polydipsia, polyphagia and polyuria.  Genitourinary: Positive for dysuria, urgency, decreased urine volume and difficulty urinating. Negative for frequency and hematuria.  Musculoskeletal: Negative for gait  problem.  Skin: Negative for color change, pallor and rash.  Neurological: Negative for dizziness, syncope, light-headedness and headaches.  Hematological: Does not bruise/bleed easily.  Psychiatric/Behavioral: Negative for behavioral problems and confusion.      Allergies  Review of patient's allergies indicates no known allergies.  Home Medications   Prior to Admission medications   Medication Sig Start Date End Date Taking? Authorizing Provider  clopidogrel (PLAVIX) 75 MG tablet Take 75 mg by mouth every morning.    Yes Historical Provider, MD  dorzolamide-timolol (COSOPT) 22.3-6.8 MG/ML ophthalmic solution Place 1 drop into both eyes 2 (two) times daily.  01/24/14  Yes Historical Provider, MD  finasteride (PROSCAR) 5 MG tablet Take 5 mg by mouth every morning.    Yes Historical Provider, MD  isosorbide mononitrate (IMDUR) 60 MG 24 hr tablet Take 60 mg by mouth every morning.    Yes Historical Provider, MD  latanoprost (XALATAN) 0.005 % ophthalmic solution Place 1 drop into both eyes 2 (two) times daily.  02/04/14  Yes Historical Provider, MD  losartan (COZAAR) 100 MG tablet Take 100 mg by mouth every morning.    Yes Historical Provider, MD  simvastatin (ZOCOR) 40 MG tablet Take 40 mg by mouth every morning.    Yes Historical Provider, MD  tamsulosin (FLOMAX) 0.4 MG CAPS capsule Take 0.4 mg by mouth every morning.    Yes Historical Provider, MD  triamcinolone cream (KENALOG) 0.1 % Apply 1 application topically 2 (two) times daily as needed (psoriasis).  12/14/13  Yes Historical Provider, MD  ciprofloxacin (CIPRO) 500 MG tablet Take 1 tablet (500  mg total) by mouth 2 (two) times daily. 02/24/14   Theodis Blaze, MD  oxyCODONE (OXY IR/ROXICODONE) 5 MG immediate release tablet Take 1 tablet (5 mg total) by mouth every 4 (four) hours as needed for moderate pain. 02/24/14   Theodis Blaze, MD   BP 152/85 mmHg  Pulse 58  Temp(Src) 98.5 F (36.9 C) (Oral)  Resp 20  Ht 6' (1.829 m)  Wt 227  lb 15.3 oz (103.4 kg)  BMI 30.91 kg/m2  SpO2 99% Physical Exam  Constitutional: He is oriented to person, place, and time. He appears well-developed and well-nourished. No distress.  Appears to not feel well. Not frankly toxic. Mentating well. Minimally, but appropriately conversant.  HENT:  Head: Normocephalic.  Eyes: Conjunctivae are normal. Pupils are equal, round, and reactive to light. No scleral icterus.  Neck: Normal range of motion. Neck supple. No thyromegaly present.  Cardiovascular: Normal rate and regular rhythm.  Exam reveals no gallop and no friction rub.   No murmur heard. Pulmonary/Chest: Effort normal and breath sounds normal. No respiratory distress. He has no wheezes. He has no rales.  No increased work of breathing. Clear bilateral breath sounds.  Abdominal: Soft. Bowel sounds are normal. He exhibits no distension. There is no tenderness. There is no rebound.    Musculoskeletal: Normal range of motion.  Neurological: He is alert and oriented to person, place, and time.  Skin: Skin is warm and dry. No rash noted.  Psychiatric: He has a normal mood and affect. His behavior is normal.    ED Course  Procedures (including critical care time) Labs Review Labs Reviewed  URINALYSIS, ROUTINE W REFLEX MICROSCOPIC - Abnormal; Notable for the following:    Color, Urine AMBER (*)    APPearance TURBID (*)    Hgb urine dipstick MODERATE (*)    Bilirubin Urine SMALL (*)    Protein, ur 100 (*)    Nitrite POSITIVE (*)    Leukocytes, UA LARGE (*)    All other components within normal limits  CBC WITH DIFFERENTIAL - Abnormal; Notable for the following:    WBC 18.8 (*)    Neutrophils Relative % 83 (*)    Neutro Abs 15.6 (*)    Lymphocytes Relative 5 (*)    Monocytes Absolute 2.2 (*)    All other components within normal limits  BASIC METABOLIC PANEL - Abnormal; Notable for the following:    Sodium 133 (*)    Potassium 3.3 (*)    CO2 14 (*)    Glucose, Bld 131 (*)    GFR  calc non Af Amer 52 (*)    GFR calc Af Amer 61 (*)    Anion gap 18 (*)    All other components within normal limits  URINE MICROSCOPIC-ADD ON - Abnormal; Notable for the following:    Bacteria, UA MANY (*)    All other components within normal limits  BASIC METABOLIC PANEL - Abnormal; Notable for the following:    Sodium 136 (*)    Glucose, Bld 120 (*)    GFR calc non Af Amer 53 (*)    GFR calc Af Amer 61 (*)    All other components within normal limits  CBC - Abnormal; Notable for the following:    WBC 15.2 (*)    Hemoglobin 12.9 (*)    HCT 37.2 (*)    All other components within normal limits  BASIC METABOLIC PANEL - Abnormal; Notable for the following:    Sodium 133 (*)  Potassium 3.3 (*)    CO2 17 (*)    Glucose, Bld 100 (*)    GFR calc non Af Amer 65 (*)    GFR calc Af Amer 76 (*)    All other components within normal limits  COMPREHENSIVE METABOLIC PANEL - Abnormal; Notable for the following:    Potassium 3.5 (*)    Albumin 2.8 (*)    GFR calc non Af Amer 60 (*)    GFR calc Af Amer 69 (*)    All other components within normal limits  CBC WITH DIFFERENTIAL - Abnormal; Notable for the following:    HCT 38.2 (*)    Monocytes Relative 17 (*)    Monocytes Absolute 1.1 (*)    All other components within normal limits  URINE CULTURE  CULTURE, BLOOD (ROUTINE X 2)  CULTURE, BLOOD (ROUTINE X 2)  MRSA PCR SCREENING  MAGNESIUM  CBC WITH DIFFERENTIAL  I-STAT CG4 LACTIC ACID, ED    Imaging Review Ct Abdomen Pelvis W Contrast  02/23/2014   CLINICAL DATA:  80 year old with fever, rule out abscess, elevated white blood cell count  EXAM: CT ABDOMEN AND PELVIS WITH CONTRAST  TECHNIQUE: Multidetector CT imaging of the abdomen and pelvis was performed using the standard protocol following bolus administration of intravenous contrast.  CONTRAST:  116mL OMNIPAQUE IOHEXOL 300 MG/ML  SOLN  COMPARISON:  10/04/2009  FINDINGS: Chest:Mild dependent atelectasis is noted within the  partially visualized lung bases.  Liver: The liver is mildly diffusely low in attenuation, compatible with hepatic steatosis. No suspicious hepatic mass is seen.  Gallbladder: A few dependent gallstones are present. There is no abnormal gallbladder wall thickening. There is no pericholecystic fluid. There is no intrahepatic or extrahepatic biliary ductal dilatation.  Spleen: The spleen is unremarkable.  Pancreas: The pancreas is unremarkable.  Adrenal glands: There is mild nodular thickening of the medial limb of the right adrenal gland. The adrenal glands are otherwise unremarkable.  Kidneys: There is no nephrolithiasis or hydronephrosis. Faint foci of increased density within the upper poles of the kidneys likely represents early excretion of contrast rather than stones. No suspicious renal mass is seen.  Bowel/gastrointestinal tract: A soft tissue density mass is noted within the ascending colon, just proximal to the hepatic flexure, there is no associated wall thickening. Finding likely represents contents within the fecal stream rather than malignancy (series 2, image 44). A few scattered colonic diverticula are present. There is no evidence for acute diverticulitis.  Pelvis: There is mild gallbladder wall thickening. Inflammatory stranding is noted about the urinary bladder, findings are concerning for cystitis.  The prostate gland is enlarged and measures approximately 6.7 x 6.1 cm.  Miscellaneous: No suspicious adenopathy is identified.  Osseous structures: Extensive multilevel degenerative changes of the spine are present.  IMPRESSION: 1. Findings are suspicious for acute cystitis. Clinical correlation is recommended. 2. Soft tissue density mass within the ascending colon may represent stool. If there is concern for colonic malignancy would consider colonoscopy for further evaluation. 3. Cholelithiasis without evidence for acute cholecystitis. 4. Mild hepatic steatosis.   Electronically Signed   By: Rosemarie Ax   On: 02/23/2014 13:04   Dg Chest Port 1 View  02/23/2014   CLINICAL DATA:  Fever, weakness.  EXAM: PORTABLE CHEST - 1 VIEW  COMPARISON:  02/21/2014  FINDINGS: The heart size and mediastinal contours are within normal limits. Both lungs are clear. The visualized skeletal structures are unremarkable.  IMPRESSION: No active disease.   Electronically Signed  By: Rolm Baptise M.D.   On: 02/23/2014 08:58     EKG Interpretation None      MDM   Final diagnoses:  Fever  UTI (lower urinary tract infection)  Sepsis, due to unspecified organism    Patient febrile. Episode of confusion at home. Lucid and oriented here. Not hemodynamically unstable. Perfusing well. Plan is fluids, lab evaluation. Urinalysis chest x-ray blood cultures.  20:52:  Urine appears to be infected. Blood and urine cultures pending. Leukocytosis. Acidotic with CO2 14, however normal lactate. Has had 1500 mL of fluid. Pressures 97/67. Have asked that his second liter of fluid and a third liter (has bolus. Clinically he appears much improved. He is awake alert interactive with family. Temp improved 97 8. Heart rate improved to 67. Placed a call hospitalist regarding admission.   Tanna Furry, MD 02/24/14 1336

## 2014-02-21 NOTE — ED Notes (Signed)
MD at bedside. 

## 2014-02-21 NOTE — ED Notes (Signed)
Bed: IT19 Expected date:  Expected time:  Means of arrival:  Comments: Difficulty urinating

## 2014-02-21 NOTE — ED Notes (Signed)
Patient back from CXR.

## 2014-02-21 NOTE — ED Notes (Signed)
Patient arrives via [facility] due to urinary retention Patient self caths and was able to cath himself yesterday, but has not been able to today Patient's wife informed EMS, that he is "hallucinated." Patient alert and oriented x 4

## 2014-02-22 DIAGNOSIS — I959 Hypotension, unspecified: Secondary | ICD-10-CM

## 2014-02-22 DIAGNOSIS — IMO0001 Reserved for inherently not codable concepts without codable children: Secondary | ICD-10-CM | POA: Insufficient documentation

## 2014-02-22 DIAGNOSIS — D72829 Elevated white blood cell count, unspecified: Secondary | ICD-10-CM | POA: Diagnosis present

## 2014-02-22 LAB — BASIC METABOLIC PANEL
Anion gap: 12 (ref 5–15)
BUN: 15 mg/dL (ref 6–23)
CO2: 20 mEq/L (ref 19–32)
CREATININE: 1.26 mg/dL (ref 0.50–1.35)
Calcium: 8.7 mg/dL (ref 8.4–10.5)
Chloride: 104 mEq/L (ref 96–112)
GFR, EST AFRICAN AMERICAN: 61 mL/min — AB (ref >90)
GFR, EST NON AFRICAN AMERICAN: 53 mL/min — AB (ref >90)
Glucose, Bld: 120 mg/dL — ABNORMAL HIGH (ref 70–99)
POTASSIUM: 3.8 meq/L (ref 3.7–5.3)
Sodium: 136 mEq/L — ABNORMAL LOW (ref 137–147)

## 2014-02-22 LAB — CBC
HCT: 37.2 % — ABNORMAL LOW (ref 39.0–52.0)
Hemoglobin: 12.9 g/dL — ABNORMAL LOW (ref 13.0–17.0)
MCH: 29.4 pg (ref 26.0–34.0)
MCHC: 34.7 g/dL (ref 30.0–36.0)
MCV: 84.7 fL (ref 78.0–100.0)
PLATELETS: 170 10*3/uL (ref 150–400)
RBC: 4.39 MIL/uL (ref 4.22–5.81)
RDW: 14.2 % (ref 11.5–15.5)
WBC: 15.2 10*3/uL — ABNORMAL HIGH (ref 4.0–10.5)

## 2014-02-22 LAB — MAGNESIUM: Magnesium: 1.9 mg/dL (ref 1.5–2.5)

## 2014-02-22 LAB — MRSA PCR SCREENING: MRSA BY PCR: NEGATIVE

## 2014-02-22 MED ORDER — OXYCODONE HCL 5 MG PO TABS: 5.0000 mg | ORAL_TABLET | ORAL | Status: DC | PRN

## 2014-02-22 MED ORDER — SIMVASTATIN 40 MG PO TABS
40.0000 mg | ORAL_TABLET | Freq: Every morning | ORAL | Status: DC
  Administered 2014-02-22 – 2014-02-23 (×2): 40 mg via ORAL
  Filled 2014-02-22 (×3): qty 1

## 2014-02-22 MED ORDER — FINASTERIDE 5 MG PO TABS
5.0000 mg | ORAL_TABLET | Freq: Every morning | ORAL | Status: DC
  Administered 2014-02-22 – 2014-02-23 (×2): 5 mg via ORAL
  Filled 2014-02-22 (×3): qty 1

## 2014-02-22 MED ORDER — SODIUM CHLORIDE 0.9 % IV SOLN
INTRAVENOUS | Status: DC
  Administered 2014-02-22: 1000 mL via INTRAVENOUS
  Administered 2014-02-22: 11:00:00 via INTRAVENOUS

## 2014-02-22 MED ORDER — SODIUM CHLORIDE 0.9 % IV SOLN: INTRAVENOUS | Status: AC

## 2014-02-22 MED ORDER — ONDANSETRON HCL 4 MG PO TABS: 4.0000 mg | ORAL_TABLET | Freq: Four times a day (QID) | ORAL | Status: DC | PRN

## 2014-02-22 MED ORDER — HYDROMORPHONE HCL 1 MG/ML IJ SOLN: 0.5000 mg | INTRAMUSCULAR | Status: DC | PRN

## 2014-02-22 MED ORDER — DORZOLAMIDE HCL-TIMOLOL MAL 2-0.5 % OP SOLN
1.0000 [drp] | Freq: Two times a day (BID) | OPHTHALMIC | Status: DC
  Administered 2014-02-22 – 2014-02-23 (×3): 1 [drp] via OPHTHALMIC
  Filled 2014-02-22: qty 10

## 2014-02-22 MED ORDER — LATANOPROST 0.005 % OP SOLN
1.0000 [drp] | Freq: Two times a day (BID) | OPHTHALMIC | Status: DC
  Administered 2014-02-22 – 2014-02-23 (×3): 1 [drp] via OPHTHALMIC
  Filled 2014-02-22: qty 2.5

## 2014-02-22 MED ORDER — ONDANSETRON HCL 4 MG/2ML IJ SOLN: 4.0000 mg | Freq: Four times a day (QID) | INTRAMUSCULAR | Status: DC | PRN

## 2014-02-22 MED ORDER — ALUM & MAG HYDROXIDE-SIMETH 200-200-20 MG/5ML PO SUSP: 30.0000 mL | Freq: Four times a day (QID) | ORAL | Status: DC | PRN

## 2014-02-22 MED ORDER — ACETAMINOPHEN 325 MG PO TABS
650.0000 mg | ORAL_TABLET | Freq: Four times a day (QID) | ORAL | Status: DC | PRN
  Administered 2014-02-22 – 2014-02-23 (×3): 650 mg via ORAL
  Filled 2014-02-22 (×3): qty 2

## 2014-02-22 MED ORDER — ENOXAPARIN SODIUM 40 MG/0.4ML ~~LOC~~ SOLN
40.0000 mg | SUBCUTANEOUS | Status: DC
  Administered 2014-02-22 – 2014-02-23 (×2): 40 mg via SUBCUTANEOUS
  Filled 2014-02-22 (×3): qty 0.4

## 2014-02-22 MED ORDER — PANTOPRAZOLE SODIUM 40 MG PO TBEC
40.0000 mg | DELAYED_RELEASE_TABLET | Freq: Every day | ORAL | Status: DC
  Administered 2014-02-22 – 2014-02-23 (×2): 40 mg via ORAL
  Filled 2014-02-22 (×3): qty 1

## 2014-02-22 MED ORDER — TAMSULOSIN HCL 0.4 MG PO CAPS
0.4000 mg | ORAL_CAPSULE | Freq: Every morning | ORAL | Status: DC
  Administered 2014-02-22 – 2014-02-23 (×2): 0.4 mg via ORAL
  Filled 2014-02-22 (×3): qty 1

## 2014-02-22 MED ORDER — CLOPIDOGREL BISULFATE 75 MG PO TABS
75.0000 mg | ORAL_TABLET | Freq: Every day | ORAL | Status: DC
  Administered 2014-02-22 – 2014-02-24 (×3): 75 mg via ORAL
  Filled 2014-02-22 (×4): qty 1

## 2014-02-22 MED ORDER — ACETAMINOPHEN 650 MG RE SUPP: 650.0000 mg | Freq: Four times a day (QID) | RECTAL | Status: DC | PRN

## 2014-02-22 NOTE — Evaluation (Signed)
Physical Therapy Evaluation Patient Details Name: Glenn Patel MRN: [id number] DOB: [birth year] Today's Date: 02/22/2014   History of Present Illness  Glenn Patel is a 80 y.o. male with a history of HTN, Hyperlipidemia, Urinary Retention, BPH who presents to the Ed with complaints of fevers and chills and confusion today, and malaise and dysuria  For the past 4-5 days.  He also had has increased weakness and decreased appetite.   He straight caths himself due to urinary retention and reports that he had decreased urinary output as well for the past day.  He was evaluated in the ED and was found to have a UTI and hypotension.     Clinical Impression  Patient was just getting self Out of recliner, able to stand and get self back into bed. Pt reports too fatigued to walk. Patient will benefit from PT to improve strength and safety to DC back to home.    Follow Up Recommendations No PT follow up    Equipment Recommendations  None recommended by PT    Recommendations for Other Services       Precautions / Restrictions Precautions Precautions: Fall      Mobility  Bed Mobility Overal bed mobility: Needs Assistance Bed Mobility: Sit to Supine     Supine to sit: Min guard Sit to supine: Supervision   General bed mobility comments: able to get legs onto bed without assistance  Transfers Overall transfer level: Needs assistance Equipment used: None Transfers: Sit to/from [employer] Transfers Sit to Stand: Supervision Stand pivot transfers: Supervision       General transfer comment: pt had just stood up from recliner with wife at his side.  cautioned patient to wait  to get lines untangged before turning to ge into bed.  Ambulation/Gait             General Gait Details: pt declined a walk, standing balance looked WFL.  Stairs            Wheelchair Mobility    Modified Rankin (Stroke Patients Only)       Balance Overall balance assessment: No  apparent balance deficits (not formally assessed)                                           Pertinent Vitals/Pain Pain Assessment: No/denies pain    Home Living Family/patient expects to be discharged to:: Private residence Living Arrangements: Spouse/significant other Available Help at Discharge: Family Type of Home: House Home Access: Stairs to enter Entrance Stairs-Rails: Can reach both Entrance Stairs-Number of Steps: 5 Home Layout: One level Home Equipment: None      Prior Function Level of Independence: Independent         Comments: still works     Journalist, newspaper        Extremity/Trunk Assessment   Upper Extremity Assessment: Generalized weakness           Lower Extremity Assessment: Generalized weakness      Cervical / Trunk Assessment: Normal  Communication   Communication: No difficulties  Cognition Arousal/Alertness: Awake/alert Behavior During Therapy: WFL for tasks assessed/performed Overall Cognitive Status: Impaired/Different from baseline Area of Impairment: Orientation               General Comments: stated "about the 24th"    General Comments      Exercises  Assessment/Plan    PT Assessment    PT Diagnosis Generalized weakness   PT Problem List    PT Treatment Interventions     PT Goals (Current goals can be found in the Care Plan section) Acute Rehab PT Goals Patient Stated Goal: to get back to work PT Goal Formulation: With patient/family Time For Goal Achievement: 03/08/14 Potential to Achieve Goals: Good    Frequency     Barriers to discharge        Co-evaluation               End of Session   Activity Tolerance: Patient tolerated treatment well Patient left: in bed;with call bell/phone within reach;with family/visitor present Nurse Communication: Mobility status         Time: 1430-1445 PT Time Calculation (min) (ACUTE ONLY): 15 min   Charges:   PT  Evaluation $Initial PT Evaluation Tier I: 1 Procedure PT Treatments $Gait Training: 8-22 mins   PT G Codes:          Claretha Cooper 02/22/2014, 2:59 PM Tresa Endo PT [phone]

## 2014-02-22 NOTE — Progress Notes (Signed)
CARE MANAGEMENT NOTE 02/22/2014  Patient:  Glenn Patel, Glenn Patel   Account Number:  [id number]  Date Initiated:  02/22/2014  Documentation initiated by:  Dessa Phi  Subjective/Objective Assessment:   80 Y/O M ADMITTED W/SEPSIS.     Action/Plan:   FROM HOME.   Anticipated DC Date:  02/26/2014   Anticipated DC Plan:  [place]  CM consult      Choice offered to / List presented to:             Status of service:  In process, will continue to follow Medicare Important Message given?   (If response is "NO", the following Medicare IM given date fields will be blank) Date Medicare IM given:   Medicare IM given by:   Date Additional Medicare IM given:   Additional Medicare IM given by:    Discharge Disposition:    Per UR Regulation:  Reviewed for med. necessity/level of care/duration of stay  If discussed at [place] of Stay Meetings, dates discussed:    Comments:  02/22/14 Gussie Towson RN,BSN NCM [place].

## 2014-02-22 NOTE — Plan of Care (Signed)
Problem: Consults Goal: Skin Care Protocol Initiated - if Braden Score 18 or less If consults are not indicated, leave blank or document N/A Outcome: Not Applicable Date Met:  99/24/26

## 2014-02-22 NOTE — Evaluation (Signed)
Occupational Therapy Evaluation Patient Details Name: Glenn Patel MRN: [id number] DOB: February 22, 1934 Today's Date: 02/22/2014    History of Present Illness Glenn Patel is a 80 y.o. male with a history of HTN, Hyperlipidemia, Urinary Retention, BPH who presents to the Ed with complaints of fevers and chills and confusion today, and malaise and dysuria  For the past 4-5 days.  He also had has increased weakness and decreased appetite.   He straight caths himself due to urinary retention and reports that he had decreased urinary output as well for the past day.  He was evaluated in the ED and was found to have a UTI and hypotension and culture were sent and he was administered IVFs and IV Rocephin and referred for admission.       Clinical Impression   Pt admitted with UTI. Pt currently with functional limitations due to the deficits listed below (see OT Problem List).  Pt will benefit from skilled OT to increase their safety and independence with ADL and functional mobility for ADL to facilitate discharge to venue listed below.      Follow Up Recommendations  No OT follow up    Equipment Recommendations  None recommended by OT           Mobility Bed Mobility Overal bed mobility: Needs Assistance Bed Mobility: Supine to Sit     Supine to sit: Min guard        Transfers Overall transfer level: Needs assistance Equipment used: 1 person hand held assist Transfers: Sit to/from Stand Sit to Stand: Min guard                   ADL Overall ADL's : Needs assistance/impaired Eating/Feeding: Supervision/ safety;Sitting   Grooming: Standing;Min guard   Upper Body Bathing: Set up;Sitting   Lower Body Bathing: Sit to/from stand;Minimal assistance       Lower Body Dressing: Cueing for safety;Minimal assistance   Toilet Transfer: Min guard   Toileting- Clothing Manipulation and Hygiene: Min guard;Sit to/from stand         General ADL Comments: Pt feels weak from  being sick- but agreeable to getting OOB and OT.                Pertinent Vitals/Pain Pain Assessment: No/denies pain     Hand Dominance     Extremity/Trunk Assessment Upper Extremity Assessment Upper Extremity Assessment: Generalized weakness       Cervical / Trunk Assessment Cervical / Trunk Assessment: Other exceptions   Communication Communication Communication: No difficulties   Cognition Arousal/Alertness: Awake/alert Behavior During Therapy: WFL for tasks assessed/performed Overall Cognitive Status: Within Functional Limits for tasks assessed                     General Comments    wife very supportive!           Home Living Family/patient expects to be discharged to:: Private residence Living Arrangements: Spouse/significant other Available Help at Discharge: Family Type of Home: House Home Access: Stairs to enter Technical brewer of Steps: 5 Entrance Stairs-Rails: Can reach both Home Layout: One level     Bathroom Shower/Tub: Occupational psychologist: [place]: None          Prior Functioning/Environment Level of Independence: Independent             OT Diagnosis: Generalized weakness   OT Problem List: Decreased strength;Decreased activity tolerance  OT Treatment/Interventions: Self-care/ADL training;Patient/family education    OT Goals(Current goals can be found in the care plan section) Acute Rehab OT Goals Patient Stated Goal: get back to swimming OT Goal Formulation: With patient Time For Goal Achievement: 03/08/14 ADL Goals Pt Will Perform Grooming: with modified independence;standing Pt Will Transfer to Toilet: with modified independence;regular height toilet;ambulating Pt Will Perform Toileting - Clothing Manipulation and hygiene: with modified independence;sit to/from stand Pt Will Perform Tub/Shower Transfer: Shower transfer;with modified independence  OT Frequency: Min  2X/week   Barriers to D/C:            Co-evaluation              End of Session Nurse Communication: Mobility status  Activity Tolerance: Patient limited by fatigue Patient left: in chair;with call bell/phone within reach;with family/visitor present   Time: 9450-3888 OT Time Calculation (min): 23 min Charges:  OT General Charges $OT Visit: 1 Procedure OT Evaluation $Initial OT Evaluation Tier I: 1 Procedure OT Treatments $Self Care/Home Management : 8-22 mins G-Codes:    Payton Mccallum D March 02, 2014, 1:56 PM

## 2014-02-22 NOTE — Progress Notes (Signed)
[facility] PROGRESS NOTE  Glenn Patel HGD:[id number] DOB: Jan 09, 1934 DOA: 02/21/2014 PCP: Lujean Amel, MD  Assessment/Plan: #1 sepsis secondary to urinary tract infection Patient presented with criteria for sepsis including a leukocytosis with a white count of 18.8, patient noted to have borderline hypotension with a blood pressure of 95/60 on arrival, respiratory rate of 25. Patient has been pancultured and results are pending. WBC is trending down. Continue empiric IV Rocephin. Follow.  #2 hypotension Likely multifactorial secondary to sepsis and volume depletion. Blood pressure responded to IV fluids. Patient has been pancultured. Continue empiric IV Rocephin. Follow.  #3 hypokalemia Resolved.  #4 leukocytosis Likely secondary to urinary tract infection. Patient has been pancultured. WBC is trending down. Continue empiric IV Rocephin.  #4 UTI Urine cultures are pending. Continue IV Rocephin.  #5 urinary retention/BPH Continue Proscar and Flomax. Straight I/Os as needed.  #6 hyperlipidemia Continue statin  #7 prophylaxis Protonix for GI prophylaxis. Lovenox for DVT prophylaxis.     Code Status: Full Family Communication: Updated patient and wife at bedside. Disposition Plan: Remain the step down unit today.   Consultants:  None  Procedures:  Chest x-ray 02/21/2014    Antibiotics:  IV Rocephin 02/21/2014  HPI/Subjective: Patient states he's feeling a little bit better from admission. Patient complaining of generalized weakness. Patient asking to eat.  Objective: Filed Vitals:   02/22/14 0600  BP:   Pulse: 60  Temp:   Resp: 16    Intake/Output Summary (Last 24 hours) at 02/22/14 0838 Last data filed at 02/22/14 0700  Gross per 24 hour  Intake 1376.99 ml  Output    575 ml  Net 801.99 ml   Filed Weights   02/22/14 0118  Weight: 100.4 kg (221 lb 5.5 oz)    Exam:   General:  NAD  Cardiovascular: RRR  Respiratory:  CTAB  Abdomen: Soft, nontender, nondistended, positive bowel sounds.  Musculoskeletal: No clubbing cyanosis or edema.   Data Reviewed: Basic Metabolic Panel:  Recent Labs Lab 02/21/14 1738 02/22/14 0405 02/22/14 0414  NA 133* 136*  --   K 3.3* 3.8  --   CL 101 104  --   CO2 14* 20  --   GLUCOSE 131* 120*  --   BUN 17 15  --   CREATININE 1.27 1.26  --   CALCIUM 9.4 8.7  --   MG  --   --  1.9   Liver Function Tests: No results for input(s): AST, ALT, ALKPHOS, BILITOT, PROT, ALBUMIN in the last 168 hours. No results for input(s): LIPASE, AMYLASE in the last 168 hours. No results for input(s): AMMONIA in the last 168 hours. CBC:  Recent Labs Lab 02/21/14 1738 02/22/14 0405  WBC 18.8* 15.2*  NEUTROABS 15.6*  --   HGB 14.8 12.9*  HCT 42.1 37.2*  MCV 83.9 84.7  PLT 166 170   Cardiac Enzymes: No results for input(s): CKTOTAL, CKMB, CKMBINDEX, TROPONINI in the last 168 hours. BNP (last 3 results) No results for input(s): PROBNP in the last 8760 hours. CBG: No results for input(s): GLUCAP in the last 168 hours.  Recent Results (from the past 240 hour(s))  MRSA PCR Screening     Status: None   Collection Time: 02/22/14  1:26 AM  Result Value Ref Range Status   MRSA by PCR NEGATIVE NEGATIVE Final    Comment:        The GeneXpert MRSA Assay (FDA approved for NASAL specimens only), is one component of a comprehensive MRSA colonization  surveillance program. It is not intended to diagnose MRSA infection nor to guide or monitor treatment for MRSA infections.      Studies: Dg Chest 2 View  02/21/2014   CLINICAL DATA:  Fever, lethargy.  Smoker.  EXAM: CHEST  2 VIEW  COMPARISON:  12/11/2008  FINDINGS: Aortic tortuosity. Scattered atherosclerosis. Heart size normal range. Mild right apical thickening/scarring. Interstitial prominence. Mild increased AP diameter on the lateral view. Mild lingular/left lung base opacity. No pleural effusion or pneumothorax. No acute  osseous finding. Multilevel degenerative change.  IMPRESSION: Mild interstitial prominence may reflect chronic change in the setting of smoking or atypical/viral infection if acute.  Mild lingular/left lung base opacity, favor atelectasis.   Electronically Signed   By: Carlos Levering M.D.   On: 02/21/2014 18:20    Scheduled Meds: . sodium chloride   Intravenous STAT  . cefTRIAXone (ROCEPHIN)  IV  1 g Intravenous Q24H  . clopidogrel  75 mg Oral Q breakfast  . dorzolamide-timolol  1 drop Both Eyes BID  . enoxaparin (LOVENOX) injection  40 mg Subcutaneous Q24H  . finasteride  5 mg Oral q morning - 10a  . latanoprost  1 drop Both Eyes BID  . simvastatin  40 mg Oral q morning - 10a  . tamsulosin  0.4 mg Oral q morning - 10a   Continuous Infusions: . sodium chloride 1,000 mL (02/22/14 0145)    Principal Problem:   Sepsis Active Problems:   UTI (lower urinary tract infection)   Urinary retention   Hypokalemia   Hypotension   Hyperlipidemia   Leukocytosis    Time spent: 40 mins    [facility] MD [facility] Pager [phone]. If 7PM-7AM, please contact night-coverage at [email or web address], password [facility] 02/22/2014, 8:38 AM  LOS: 1 day

## 2014-02-23 ENCOUNTER — Inpatient Hospital Stay (HOSPITAL_COMMUNITY): Payer: Medicare Other

## 2014-02-23 DIAGNOSIS — B9620 Unspecified Escherichia coli [E. coli] as the cause of diseases classified elsewhere: Secondary | ICD-10-CM

## 2014-02-23 LAB — CBC WITH DIFFERENTIAL/PLATELET
BASOS ABS: 0 10*3/uL (ref 0.0–0.1)
Basophils Relative: 0 % (ref 0–1)
Eosinophils Absolute: 0 10*3/uL (ref 0.0–0.7)
Eosinophils Relative: 0 % (ref 0–5)
HCT: 39.2 % (ref 39.0–52.0)
HEMOGLOBIN: 13.6 g/dL (ref 13.0–17.0)
Lymphocytes Relative: 14 % (ref 12–46)
Lymphs Abs: 1.1 10*3/uL (ref 0.7–4.0)
MCH: 29.2 pg (ref 26.0–34.0)
MCHC: 34.7 g/dL (ref 30.0–36.0)
MCV: 84.1 fL (ref 78.0–100.0)
Monocytes Absolute: 1 10*3/uL (ref 0.1–1.0)
Monocytes Relative: 12 % (ref 3–12)
NEUTROS ABS: 5.7 10*3/uL (ref 1.7–7.7)
NEUTROS PCT: 74 % (ref 43–77)
PLATELETS: 166 10*3/uL (ref 150–400)
RBC: 4.66 MIL/uL (ref 4.22–5.81)
RDW: 14 % (ref 11.5–15.5)
WBC: 7.8 10*3/uL (ref 4.0–10.5)

## 2014-02-23 LAB — BASIC METABOLIC PANEL
ANION GAP: 13 (ref 5–15)
BUN: 12 mg/dL (ref 6–23)
CHLORIDE: 103 meq/L (ref 96–112)
CO2: 17 mEq/L — ABNORMAL LOW (ref 19–32)
Calcium: 8.5 mg/dL (ref 8.4–10.5)
Creatinine, Ser: 1.06 mg/dL (ref 0.50–1.35)
GFR calc Af Amer: 76 mL/min — ABNORMAL LOW (ref >90)
GFR, EST NON AFRICAN AMERICAN: 65 mL/min — AB (ref >90)
Glucose, Bld: 100 mg/dL — ABNORMAL HIGH (ref 70–99)
Potassium: 3.3 mEq/L — ABNORMAL LOW (ref 3.7–5.3)
SODIUM: 133 meq/L — AB (ref 137–147)

## 2014-02-23 LAB — URINE CULTURE

## 2014-02-23 MED ORDER — IOHEXOL 300 MG/ML  SOLN
100.0000 mL | Freq: Once | INTRAMUSCULAR | Status: AC | PRN
  Administered 2014-02-23: 100 mL via INTRAVENOUS

## 2014-02-23 MED ORDER — IOHEXOL 300 MG/ML  SOLN
25.0000 mL | INTRAMUSCULAR | Status: AC
  Administered 2014-02-23 (×2): 25 mL via ORAL

## 2014-02-23 MED ORDER — POTASSIUM CHLORIDE CRYS ER 20 MEQ PO TBCR
40.0000 meq | EXTENDED_RELEASE_TABLET | Freq: Once | ORAL | Status: AC
  Administered 2014-02-23: 40 meq via ORAL
  Filled 2014-02-23: qty 2

## 2014-02-23 MED ORDER — SODIUM BICARBONATE 8.4 % IV SOLN
INTRAVENOUS | Status: DC
  Administered 2014-02-23 (×2): via INTRAVENOUS
  Filled 2014-02-23 (×4): qty 850

## 2014-02-23 MED ORDER — PIPERACILLIN-TAZOBACTAM 3.375 G IVPB
3.3750 g | Freq: Three times a day (TID) | INTRAVENOUS | Status: DC
  Administered 2014-02-23 – 2014-02-24 (×2): 3.375 g via INTRAVENOUS
  Filled 2014-02-23 (×2): qty 50

## 2014-02-23 NOTE — Progress Notes (Signed)
ANTIBIOTIC CONSULT NOTE - INITIAL  Pharmacy Consult for Zosyn Indication: BC- Gm - rods  No Known Allergies  Patient Measurements: Height: 6' (182.9 cm) Weight: 227 lb 15.3 oz (103.4 kg) IBW/kg (Calculated) : 77.6   Vital Signs: Temp: 98.8 F (37.1 C) (11/24 2100) Temp Source: Oral (11/24 2100) BP: 141/79 mmHg (11/24 2100) Pulse Rate: 64 (11/24 2100) Intake/Output from previous day: 11/23 0701 - 11/24 0700 In: 1854 [P.O.:4; I.V.:1800; IV Piggyback:50] Out: 1200 [Urine:1200] Intake/Output from this shift:    Labs:  Recent Labs  02/21/14 1738 02/22/14 0405 02/23/14 0404  WBC 18.8* 15.2* 7.8  HGB 14.8 12.9* 13.6  PLT 166 170 166  CREATININE 1.27 1.26 1.06   Estimated Creatinine Clearance: 71.4 mL/min (by C-G formula based on Cr of 1.06). No results for input(s): VANCOTROUGH, VANCOPEAK, VANCORANDOM, GENTTROUGH, GENTPEAK, GENTRANDOM, TOBRATROUGH, TOBRAPEAK, TOBRARND, AMIKACINPEAK, AMIKACINTROU, AMIKACIN in the last 72 hours.   Microbiology: Recent Results (from the past 720 hour(s))  Culture, blood (routine x 2)     Status: None (Preliminary result)   Collection Time: 02/21/14  5:41 PM  Result Value Ref Range Status   Specimen Description BLOOD RIGHT ARM  5 ML IN [facility] BOTTLE  Final   Special Requests NONE  Final   Culture  Setup Time   Final    02/21/2014 21:36 Performed at [facility]    Culture   Final           BLOOD CULTURE RECEIVED NO GROWTH TO DATE CULTURE WILL BE HELD FOR 5 DAYS BEFORE ISSUING A FINAL NEGATIVE REPORT Performed at [facility]    Report Status PENDING  Incomplete  Culture, blood (routine x 2)     Status: None (Preliminary result)   Collection Time: 02/21/14  6:01 PM  Result Value Ref Range Status   Specimen Description BLOOD LEFT FOREARM  5 ML IN [facility] BOTTLE  Final   Special Requests NONE  Final   Culture  Setup Time   Final    02/21/2014 21:36 Performed at [place] Note: Gram Stain Report Called to,Read Back By and Verified With: BETTY JONES ON 02/23/2014 AT 9:30P BY WILEJ Performed at [facility]    Report Status PENDING  Incomplete  Urine culture     Status: None   Collection Time: 02/21/14  6:46 PM  Result Value Ref Range Status   Specimen Description URINE, CLEAN CATCH  Final   Special Requests NONE  Final   Culture  Setup Time   Final    02/21/2014 22:42 Performed at [place]   Final    >=100,000 COLONIES/ML Performed at [facility]    Culture   Final    ESCHERICHIA COLI Performed at [facility]    Report Status 02/23/2014 FINAL  Final   Organism ID, Bacteria ESCHERICHIA COLI  Final      Susceptibility   Escherichia coli - MIC*    AMPICILLIN >=32 RESISTANT Resistant     CEFAZOLIN 8 SENSITIVE Sensitive     CEFTRIAXONE <=1 SENSITIVE Sensitive     CIPROFLOXACIN <=0.25 SENSITIVE Sensitive     GENTAMICIN <=1 SENSITIVE Sensitive     LEVOFLOXACIN <=0.12 SENSITIVE Sensitive     NITROFURANTOIN <=16 SENSITIVE Sensitive     TOBRAMYCIN <=1 SENSITIVE Sensitive     TRIMETH/SULFA <=20 SENSITIVE Sensitive     PIP/TAZO <=4 SENSITIVE  Sensitive     * ESCHERICHIA COLI  MRSA PCR Screening     Status: None   Collection Time: 02/22/14  1:26 AM  Result Value Ref Range Status   MRSA by PCR NEGATIVE NEGATIVE Final    Comment:        The GeneXpert MRSA Assay (FDA approved for NASAL specimens only), is one component of a comprehensive MRSA colonization surveillance program. It is not intended to diagnose MRSA infection nor to guide or monitor treatment for MRSA infections.     Medical History: Past Medical History  Diagnosis Date  . Hypertension   . Hyperlipidemia   . Urinary retention due to benign prostatic hyperplasia     Medications:  Scheduled:  . clopidogrel  75 mg Oral Q breakfast  . dorzolamide-timolol  1 drop Both Eyes BID  . enoxaparin (LOVENOX) injection  40 mg  Subcutaneous Q24H  . finasteride  5 mg Oral q morning - 10a  . latanoprost  1 drop Both Eyes BID  . pantoprazole  40 mg Oral Daily  . piperacillin-tazobactam (ZOSYN)  IV  3.375 g Intravenous 3 times per day  . simvastatin  40 mg Oral q morning - 10a  . tamsulosin  0.4 mg Oral q morning - 10a   Infusions:  . dextrose 5 % 850 mL with sodium bicarbonate 150 mEq infusion 75 mL/hr at 02/23/14 1900   Assessment: 77 yoM with sepsis secondary to UTI.  Zosyn per Rx for Gm- rods BC.   Goal of Therapy:  Treat infection  Plan:   Zosyn 3.375 Gm IV q8h EI  F/u Scr/cultures as needed  Lawana Pai R 02/23/2014,10:15 PM

## 2014-02-23 NOTE — Progress Notes (Signed)
Physical Therapy Treatment Patient Details Name: Glenn Patel MRN: [id number] DOB: 1934/03/03 Today's Date: 02/23/2014    History of Present Illness  Sepsis    PT Comments    Assisted pt OOB to amb full unit without any AD.  Tolerated well.  Follow Up Recommendations  No PT follow up     Equipment Recommendations       Recommendations for Other Services       Precautions / Restrictions Precautions Precautions: Fall Restrictions Weight Bearing Restrictions: No    Mobility  Bed Mobility Overal bed mobility: Needs Assistance Bed Mobility: Supine to Sit     Supine to sit: Supervision;Min guard     General bed mobility comments: supervision/MinGuard for safety with lines  Transfers Overall transfer level: Needs assistance Equipment used: None Transfers: Sit to/from Stand Sit to Stand: Supervision         General transfer comment: pt stood quickly before I could get a back gown on.  VC's for safety/impulsive but that's his base line.  required no PA to get back into bed.    Ambulation/Gait Ambulation/Gait assistance: Min guard Ambulation Distance (Feet): 450 Feet Assistive device: None Gait Pattern/deviations: Step-through pattern Gait velocity: WFL   General Gait Details: Tolerated amb a great distance without AD.  No LOB    Stairs            Wheelchair Mobility    Modified Rankin (Stroke Patients Only)       Balance                                    Cognition                            Exercises      General Comments        Pertinent Vitals/Pain      Home Living                      Prior Function            PT Goals (current goals can now be found in the care plan section) Progress towards PT goals: Progressing toward goals    Frequency       PT Plan      Co-evaluation             End of Session Equipment Utilized During Treatment: Gait belt Activity Tolerance: Patient  tolerated treatment well Patient left: in chair;with call bell/phone within reach     Time: 1025-1049 PT Time Calculation (min) (ACUTE ONLY): 24 min  Charges:  $Gait Training: 8-22 mins $Therapeutic Activity: 8-22 mins                    G Codes:      Rica Koyanagi  PTA [facility]  Acute  Rehab Pager      [phone]

## 2014-02-23 NOTE — Progress Notes (Signed)
[facility] PROGRESS NOTE  Glenn Patel EVO:[id number] DOB: [birth year] DOA: 02/21/2014 PCP: Lujean Amel, MD  Assessment/Plan: #1 sepsis secondary to urinary tract infection Patient presented with criteria for sepsis including a leukocytosis with a white count of 18.8, patient noted to have borderline hypotension with a blood pressure of 95/60 on arrival, respiratory rate of 25. Patient has been pancultured and results are pending. Urine culture c/w E Coli. WBC is trending down. Patient with fevers last one 102.2 this morning. Check CT abd/pelvis to r/o abscess or abdominal pathology. Continue empiric IV Rocephin. Follow.  #2 hypotension Likely multifactorial secondary to sepsis and volume depletion. Blood pressure responded to IV fluids. Improved. Patient has been pancultured. Continue empiric IV Rocephin. Follow.  #3 fevers ?? etiology. Blood cultures are pending. Urine cultures with Escherichia coli UTI. WBC trending down. Patient however spiking fevers last temp of 102.2 at 4 AM this morning. Patient is clammy feeling. Patient does not feel well. Will check a CT of the abdomen and pelvis to rule out abscess or intra-abdominal pathology. Repeat chest x-ray. Will continue empiric IV Rocephin. If workup is negative and patient still spiking fevers may need to consult with ID. Follow for now.  #4 hypokalemia Replete.  #5 leukocytosis Likely secondary to urinary tract infection. Patient has been pancultured. WBC is trending down. Continue empiric IV Rocephin.  #6 E Coli UTI Urine cultures c/w E Coli. Sensitivities pending. Continue IV Rocephin.  #7 urinary retention/BPH Continue Proscar and Flomax. Straight I/Os as needed.  #8 hyperlipidemia Continue statin  #9 prophylaxis Protonix for GI prophylaxis. Lovenox for DVT prophylaxis.     Code Status: Full Family Communication: Updated patient and wife at bedside. Disposition Plan: Remain the step down unit  today.   Consultants:  None  Procedures:  Chest x-ray 02/21/2014    Antibiotics:  IV Rocephin 02/21/2014  HPI/Subjective: Patient states he's not feeling well. Patient with fever early this morning. No CP. No SOB.  Objective: Filed Vitals:   02/23/14 0500  BP:   Pulse:   Temp: 99 F (37.2 C)  Resp:     Intake/Output Summary (Last 24 hours) at 02/23/14 0820 Last data filed at 02/23/14 0700  Gross per 24 hour  Intake   1854 ml  Output   1200 ml  Net    654 ml   Filed Weights   02/22/14 0118 02/23/14 0400  Weight: 100.4 kg (221 lb 5.5 oz) 103.4 kg (227 lb 15.3 oz)    Exam:   General:  NAD. Warm to touch. Clammy  Cardiovascular: RRR  Respiratory: CTAB  Abdomen: Soft, nontender, nondistended, positive bowel sounds.  Musculoskeletal: No clubbing cyanosis or edema.   Data Reviewed: Basic Metabolic Panel:  Recent Labs Lab 02/21/14 1738 02/22/14 0405 02/22/14 0414 02/23/14 0404  NA 133* 136*  --  133*  K 3.3* 3.8  --  3.3*  CL 101 104  --  103  CO2 14* 20  --  17*  GLUCOSE 131* 120*  --  100*  BUN 17 15  --  12  CREATININE 1.27 1.26  --  1.06  CALCIUM 9.4 8.7  --  8.5  MG  --   --  1.9  --    Liver Function Tests: No results for input(s): AST, ALT, ALKPHOS, BILITOT, PROT, ALBUMIN in the last 168 hours. No results for input(s): LIPASE, AMYLASE in the last 168 hours. No results for input(s): AMMONIA in the last 168 hours. CBC:  Recent Labs Lab 02/21/14 1738  02/22/14 0405 02/23/14 0404  WBC 18.8* 15.2* 7.8  NEUTROABS 15.6*  --  5.7  HGB 14.8 12.9* 13.6  HCT 42.1 37.2* 39.2  MCV 83.9 84.7 84.1  PLT 166 170 166   Cardiac Enzymes: No results for input(s): CKTOTAL, CKMB, CKMBINDEX, TROPONINI in the last 168 hours. BNP (last 3 results) No results for input(s): PROBNP in the last 8760 hours. CBG: No results for input(s): GLUCAP in the last 168 hours.  Recent Results (from the past 240 hour(s))  Culture, blood (routine x 2)     Status:  None (Preliminary result)   Collection Time: 02/21/14  5:41 PM  Result Value Ref Range Status   Specimen Description BLOOD RIGHT ARM  5 ML IN [facility] BOTTLE  Final   Special Requests NONE  Final   Culture  Setup Time   Final    02/21/2014 21:36 Performed at [facility]    Culture   Final           BLOOD CULTURE RECEIVED NO GROWTH TO DATE CULTURE WILL BE HELD FOR 5 DAYS BEFORE ISSUING A FINAL NEGATIVE REPORT Performed at [facility]    Report Status PENDING  Incomplete  Culture, blood (routine x 2)     Status: None (Preliminary result)   Collection Time: 02/21/14  6:01 PM  Result Value Ref Range Status   Specimen Description BLOOD LEFT FOREARM  5 ML IN [facility] BOTTLE  Final   Special Requests NONE  Final   Culture  Setup Time   Final    02/21/2014 21:36 Performed at [facility]    Culture   Final           BLOOD CULTURE RECEIVED NO GROWTH TO DATE CULTURE WILL BE HELD FOR 5 DAYS BEFORE ISSUING A FINAL NEGATIVE REPORT Performed at [facility]    Report Status PENDING  Incomplete  Urine culture     Status: None (Preliminary result)   Collection Time: 02/21/14  6:46 PM  Result Value Ref Range Status   Specimen Description URINE, CLEAN CATCH  Final   Special Requests NONE  Final   Culture  Setup Time   Final    02/21/2014 22:42 Performed at [place]   Final    >=100,000 COLONIES/ML Performed at [facility]    Culture   Final    ESCHERICHIA COLI Performed at [facility]    Report Status PENDING  Incomplete  MRSA PCR Screening     Status: None   Collection Time: 02/22/14  1:26 AM  Result Value Ref Range Status   MRSA by PCR NEGATIVE NEGATIVE Final    Comment:        The GeneXpert MRSA Assay (FDA approved for NASAL specimens only), is one component of a comprehensive MRSA colonization surveillance program. It is not intended to diagnose MRSA infection nor to guide or monitor treatment  for MRSA infections.      Studies: Dg Chest 2 View  02/21/2014   CLINICAL DATA:  Fever, lethargy.  Smoker.  EXAM: CHEST  2 VIEW  COMPARISON:  12/11/2008  FINDINGS: Aortic tortuosity. Scattered atherosclerosis. Heart size normal range. Mild right apical thickening/scarring. Interstitial prominence. Mild increased AP diameter on the lateral view. Mild lingular/left lung base opacity. No pleural effusion or pneumothorax. No acute osseous finding. Multilevel degenerative change.  IMPRESSION: Mild interstitial prominence may reflect chronic change in the setting of smoking or atypical/viral infection if acute.  Mild lingular/left lung base opacity, favor atelectasis.   Electronically Signed   By: Carlos Levering M.D.   On: 02/21/2014 18:20    Scheduled Meds: . cefTRIAXone (ROCEPHIN)  IV  1 g Intravenous Q24H  . clopidogrel  75 mg Oral Q breakfast  . dorzolamide-timolol  1 drop Both Eyes BID  . enoxaparin (LOVENOX) injection  40 mg Subcutaneous Q24H  . finasteride  5 mg Oral q morning - 10a  . latanoprost  1 drop Both Eyes BID  . pantoprazole  40 mg Oral Daily  . potassium chloride  40 mEq Oral Once  . simvastatin  40 mg Oral q morning - 10a  . tamsulosin  0.4 mg Oral q morning - 10a   Continuous Infusions: . dextrose 5 % 850 mL with sodium bicarbonate 150 mEq infusion      Principal Problem:   Sepsis Active Problems:   E-coli UTI   Urinary retention   Hypokalemia   Hypotension   Hyperlipidemia   Fever   Leukocytosis    Time spent: 40 mins    [facility] MD [facility] Pager [phone]. If 7PM-7AM, please contact night-coverage at [email or web address], password [facility] 02/23/2014, 8:20 AM  LOS: 2 days

## 2014-02-24 LAB — CBC WITH DIFFERENTIAL/PLATELET
Basophils Absolute: 0 10*3/uL (ref 0.0–0.1)
Basophils Relative: 1 % (ref 0–1)
Eosinophils Absolute: 0.1 10*3/uL (ref 0.0–0.7)
Eosinophils Relative: 1 % (ref 0–5)
HEMATOCRIT: 38.2 % — AB (ref 39.0–52.0)
Hemoglobin: 13.3 g/dL (ref 13.0–17.0)
LYMPHS PCT: 23 % (ref 12–46)
Lymphs Abs: 1.5 10*3/uL (ref 0.7–4.0)
MCH: 28.9 pg (ref 26.0–34.0)
MCHC: 34.8 g/dL (ref 30.0–36.0)
MCV: 83 fL (ref 78.0–100.0)
MONO ABS: 1.1 10*3/uL — AB (ref 0.1–1.0)
MONOS PCT: 17 % — AB (ref 3–12)
NEUTROS ABS: 3.8 10*3/uL (ref 1.7–7.7)
Neutrophils Relative %: 58 % (ref 43–77)
Platelets: 161 10*3/uL (ref 150–400)
RBC: 4.6 MIL/uL (ref 4.22–5.81)
RDW: 14 % (ref 11.5–15.5)
WBC: 6.5 10*3/uL (ref 4.0–10.5)

## 2014-02-24 LAB — COMPREHENSIVE METABOLIC PANEL
ALK PHOS: 80 U/L (ref 39–117)
ALT: 24 U/L (ref 0–53)
AST: 20 U/L (ref 0–37)
Albumin: 2.8 g/dL — ABNORMAL LOW (ref 3.5–5.2)
Anion gap: 12 (ref 5–15)
BUN: 8 mg/dL (ref 6–23)
CHLORIDE: 104 meq/L (ref 96–112)
CO2: 24 mEq/L (ref 19–32)
CREATININE: 1.14 mg/dL (ref 0.50–1.35)
Calcium: 8.5 mg/dL (ref 8.4–10.5)
GFR calc Af Amer: 69 mL/min — ABNORMAL LOW (ref >90)
GFR calc non Af Amer: 60 mL/min — ABNORMAL LOW (ref >90)
Glucose, Bld: 99 mg/dL (ref 70–99)
POTASSIUM: 3.5 meq/L — AB (ref 3.7–5.3)
Sodium: 140 mEq/L (ref 137–147)
Total Bilirubin: 0.5 mg/dL (ref 0.3–1.2)
Total Protein: 6.3 g/dL (ref 6.0–8.3)

## 2014-02-24 MED ORDER — OXYCODONE HCL 5 MG PO TABS: 5.0000 mg | ORAL_TABLET | ORAL | Status: DC | PRN

## 2014-02-24 MED ORDER — CIPROFLOXACIN HCL 500 MG PO TABS
500.0000 mg | ORAL_TABLET | Freq: Two times a day (BID) | ORAL | Status: DC
Start: 2014-02-24 — End: 2014-02-24
  Filled 2014-02-24 (×3): qty 1

## 2014-02-24 MED ORDER — POTASSIUM CHLORIDE CRYS ER 20 MEQ PO TBCR
40.0000 meq | EXTENDED_RELEASE_TABLET | Freq: Once | ORAL | Status: DC
  Filled 2014-02-24: qty 2

## 2014-02-24 MED ORDER — CIPROFLOXACIN HCL 500 MG PO TABS: 500.0000 mg | ORAL_TABLET | Freq: Two times a day (BID) | ORAL | Status: DC

## 2014-02-24 NOTE — Plan of Care (Signed)
Problem: Discharge Progression Outcomes Goal: Barriers To Progression Addressed/Resolved Outcome: Completed/Met Date Met:  02/24/14 Goal: Discharge plan in place and appropriate Outcome: Completed/Met Date Met:  02/24/14 Goal: Pain controlled with appropriate interventions Outcome: Completed/Met Date Met:  02/24/14 Goal: Hemodynamically stable Outcome: Completed/Met Date Met:  59/97/74 Goal: Complications resolved/controlled Outcome: Completed/Met Date Met:  02/24/14 Goal: Tolerating diet Outcome: Completed/Met Date Met:  02/24/14 Goal: Activity appropriate for discharge plan Outcome: Completed/Met Date Met:  02/24/14 Goal: Temperature < 100 x 24 hrs on PO antibiotics Outcome: Completed/Met Date Met:  02/24/14 Goal: Other Discharge Outcomes/Goals Outcome: Completed/Met Date Met:  02/24/14

## 2014-02-24 NOTE — Progress Notes (Signed)
Discharge instructions given to pt, verbalized understanding. Left the unit in stable condition. 

## 2014-02-24 NOTE — Discharge Instructions (Signed)

## 2014-02-24 NOTE — Discharge Summary (Signed)
Physician Discharge Summary  Glenn Patel WIO:[id number] DOB: [birth year] DOA: 02/21/2014  PCP: Lujean Amel, MD  Admit date: 02/21/2014 Discharge date: 02/24/2014  Recommendations for Outpatient Follow-up:  1. Pt will need to follow up with PCP in 2-3 weeks post discharge 2. Please obtain BMP to evaluate electrolytes and kidney function 3. Please also check CBC to evaluate Hg and Hct levels 4. Cipro for 5 days upon discharge  5. K-dur 40 MQ given prior to discharge   Discharge Diagnoses:  Principal Problem:   Sepsis Active Problems:   E-coli UTI   Urinary retention   Hypokalemia   Hypotension   Hyperlipidemia   Fever   Leukocytosis  Discharge Condition: Stable  Diet recommendation: Heart healthy diet discussed in details   History of present illness:  79 y.o. male with a history of HTN, Hyperlipidemia, Urinary Retention, BPH who presents to the Ed with complaints of fevers and chills and confusion today, and malaise and dysuria For the past 4-5 days. He also had has increased weakness and decreased appetite. He straight caths himself due to urinary retention and reports that he had decreased urinary output as well for the past day. He was evaluated in the ED and was found to have a UTI and hypotension and culture were sent and he was administered IVFs and IV Rocephin and referred for admission.   Hospital Course:  Assessment/Plan: #1 sepsis secondary to urinary tract infection Patient presented with criteria for sepsis including a leukocytosis with a white count of 18.8, patient noted to have borderline hypotension with a blood pressure of 95/60 on arrival, respiratory rate of 25.  Urine culture c/w E Coli. WBC is trending down. CT abd/pelvis with ? Cystitis. Will transition to oral Cipro as pt insisting on going home today.  #2 hypotension Likely multifactorial secondary to sepsis and volume depletion. Blood pressure responded to IV fluids. Improved. Patient has  been pancultured. Continue ABX as noted above.  #3 hypokalemia Supplemented before discharge   #4 leukocytosis Likely secondary to urinary tract infection. Patient has been pancultured. WBC is trending down. Continue ABX as noted above   #5 urinary retention/BPH Continue Proscar and Flomax. Straight I/Os as needed.  #6 hyperlipidemia Continue statin  Code Status: Full Family Communication: Updated patient and wife at bedside. Disposition Plan: D/C home   Consultants:  None Antibiotics:  IV Rocephin 02/21/2014 Procedures/Studies: Dg Chest 2 View  02/21/2014  Mild interstitial prominence may reflect chronic change in the setting of smoking or atypical/viral infection if acute.  Mild lingular/left lung base opacity, favor atelectasis.    Ct Abdomen Pelvis W Contrast  02/23/2014  Findings are suspicious for acute cystitis. Clinical correlation is recommended. 2. Soft tissue density mass within the ascending colon may represent stool. If there is concern for colonic malignancy would consider colonoscopy for further evaluation. 3. Cholelithiasis without evidence for acute cholecystitis. 4. Mild hepatic steatosis.    Dg Chest Port 1 View  02/23/2014 No active disease.    Discharge Exam: Filed Vitals:   02/24/14 0502  BP: 152/85  Pulse: 58  Temp: 98.5 F (36.9 C)  Resp: 20   Filed Vitals:   02/23/14 2000 02/23/14 2100 02/24/14 0200 02/24/14 0502  BP: 155/84 141/79 146/81 152/85  Pulse: 61 64 56 58  Temp: 100.8 F (38.2 C) 98.8 F (37.1 C) 98.6 F (37 C) 98.5 F (36.9 C)  TempSrc: Oral Oral Oral Oral  Resp: 20 20 20 20   Height:      Weight:  SpO2: 98% 97% 98% 99%    General: Pt is alert, follows commands appropriately, not in acute distress Cardiovascular: Regular rate and rhythm,  no rubs, no gallops Respiratory: Clear to auscultation bilaterally, no wheezing, no crackles, no rhonchi Abdominal: Soft, non tender, non distended, bowel sounds +, no  guarding  Discharge Instructions  Discharge Instructions    Diet - low sodium heart healthy    Complete by:  As directed      Increase activity slowly    Complete by:  As directed             Medication List    TAKE these medications        ciprofloxacin 500 MG tablet  Commonly known as:  CIPRO  Take 1 tablet (500 mg total) by mouth 2 (two) times daily.     clopidogrel 75 MG tablet  Commonly known as:  PLAVIX  Take 75 mg by mouth every morning.     dorzolamide-timolol 22.3-6.8 MG/ML ophthalmic solution  Commonly known as:  COSOPT  Place 1 drop into both eyes 2 (two) times daily.     finasteride 5 MG tablet  Commonly known as:  PROSCAR  Take 5 mg by mouth every morning.     isosorbide mononitrate 60 MG 24 hr tablet  Commonly known as:  IMDUR  Take 60 mg by mouth every morning.     latanoprost 0.005 % ophthalmic solution  Commonly known as:  XALATAN  Place 1 drop into both eyes 2 (two) times daily.     losartan 100 MG tablet  Commonly known as:  COZAAR  Take 100 mg by mouth every morning.     oxyCODONE 5 MG immediate release tablet  Commonly known as:  Oxy IR/ROXICODONE  Take 1 tablet (5 mg total) by mouth every 4 (four) hours as needed for moderate pain.     simvastatin 40 MG tablet  Commonly known as:  ZOCOR  Take 40 mg by mouth every morning.     tamsulosin 0.4 MG Caps capsule  Commonly known as:  FLOMAX  Take 0.4 mg by mouth every morning.     triamcinolone cream 0.1 %  Commonly known as:  KENALOG  Apply 1 application topically 2 (two) times daily as needed (psoriasis).           Follow-up Information    Follow up with Lujean Amel, MD.   Specialty:  Family Medicine   Contact information:   [place] [address] [place] [address] [phone]       Follow up with Faye Ramsay, MD.   Specialty:  Internal Medicine   Why:  call my cell phone [phone]   Contact information:   [address] [place] [address] [phone]        The results of significant diagnostics from this hospitalization (including imaging, microbiology, ancillary and laboratory) are listed below for reference.     Microbiology: Recent Results (from the past 240 hour(s))  Culture, blood (routine x 2)     Status: None (Preliminary result)   Collection Time: 02/21/14  5:41 PM  Result Value Ref Range Status   Specimen Description BLOOD RIGHT ARM  5 ML IN [facility] BOTTLE  Final   Special Requests NONE  Final   Culture  Setup Time   Final    02/21/2014 21:36 Performed at [place]   Final  BLOOD CULTURE RECEIVED NO GROWTH TO DATE CULTURE WILL BE HELD FOR 5 DAYS BEFORE ISSUING A FINAL NEGATIVE REPORT Performed at [facility]    Report Status PENDING  Incomplete  Culture, blood (routine x 2)     Status: None (Preliminary result)   Collection Time: 02/21/14  6:01 PM  Result Value Ref Range Status   Specimen Description BLOOD LEFT FOREARM  5 ML IN [facility] BOTTLE  Final   Special Requests NONE  Final   Culture  Setup Time   Final    02/21/2014 21:36 Performed at [facility]    Culture   Final    GRAM NEGATIVE RODS Note: Gram Stain Report Called to,Read Back By and Verified With: BETTY JONES ON 02/23/2014 AT 9:30P BY WILEJ Performed at [facility]    Report Status PENDING  Incomplete  Urine culture     Status: None   Collection Time: 02/21/14  6:46 PM  Result Value Ref Range Status   Specimen Description URINE, CLEAN CATCH  Final   Special Requests NONE  Final   Culture  Setup Time   Final    02/21/2014 22:42 Performed at [place]   Final    >=100,000 COLONIES/ML Performed at [facility]    Culture   Final    ESCHERICHIA COLI Performed at [facility]    Report Status 02/23/2014 FINAL  Final   Organism ID, Bacteria ESCHERICHIA COLI  Final      Susceptibility   Escherichia coli -  MIC*    AMPICILLIN >=32 RESISTANT Resistant     CEFAZOLIN 8 SENSITIVE Sensitive     CEFTRIAXONE <=1 SENSITIVE Sensitive     CIPROFLOXACIN <=0.25 SENSITIVE Sensitive     GENTAMICIN <=1 SENSITIVE Sensitive     LEVOFLOXACIN <=0.12 SENSITIVE Sensitive     NITROFURANTOIN <=16 SENSITIVE Sensitive     TOBRAMYCIN <=1 SENSITIVE Sensitive     TRIMETH/SULFA <=20 SENSITIVE Sensitive     PIP/TAZO <=4 SENSITIVE Sensitive     * ESCHERICHIA COLI  MRSA PCR Screening     Status: None   Collection Time: 02/22/14  1:26 AM  Result Value Ref Range Status   MRSA by PCR NEGATIVE NEGATIVE Final    Comment:        The GeneXpert MRSA Assay (FDA approved for NASAL specimens only), is one component of a comprehensive MRSA colonization surveillance program. It is not intended to diagnose MRSA infection nor to guide or monitor treatment for MRSA infections.      Labs: Basic Metabolic Panel:  Recent Labs Lab 02/21/14 1738 02/22/14 0405 02/22/14 0414 02/23/14 0404 02/24/14 0519  NA 133* 136*  --  133* 140  K 3.3* 3.8  --  3.3* 3.5*  CL 101 104  --  103 104  CO2 14* 20  --  17* 24  GLUCOSE 131* 120*  --  100* 99  BUN 17 15  --  12 8  CREATININE 1.27 1.26  --  1.06 1.14  CALCIUM 9.4 8.7  --  8.5 8.5  MG  --   --  1.9  --   --    Liver Function Tests:  Recent Labs Lab 02/24/14 0519  AST 20  ALT 24  ALKPHOS 80  BILITOT 0.5  PROT 6.3  ALBUMIN 2.8*   CBC:  Recent Labs Lab 02/21/14 1738 02/22/14 0405 02/23/14 0404 02/24/14 0519  WBC 18.8* 15.2* 7.8 6.5  NEUTROABS 15.6*  --  5.7 3.8  HGB 14.8 12.9* 13.6 13.3  HCT 42.1 37.2* 39.2 38.2*  MCV 83.9 84.7 84.1 83.0  PLT 166 170 166 161   SIGNED: Time coordinating discharge: Over 30 minutes  Faye Ramsay, MD  [facility] 02/24/2014, 8:34 AM Pager [phone]  If 7PM-7AM, please contact night-coverage [email or web address] Password [email or web address]

## 2014-02-25 LAB — CULTURE, BLOOD (ROUTINE X 2)

## 2014-02-27 LAB — CULTURE, BLOOD (ROUTINE X 2): Culture: NO GROWTH

## 2014-06-15 ENCOUNTER — Other Ambulatory Visit: Payer: Self-pay | Admitting: Dermatology

## 2014-12-07 ENCOUNTER — Other Ambulatory Visit: Payer: Self-pay | Admitting: Dermatology

## 2014-12-20 ENCOUNTER — Other Ambulatory Visit: Payer: Self-pay | Admitting: Family Medicine

## 2014-12-20 DIAGNOSIS — Z0001 Encounter for general adult medical examination with abnormal findings: Secondary | ICD-10-CM

## 2014-12-28 ENCOUNTER — Ambulatory Visit
Admission: RE | Admit: 2014-12-28 | Discharge: 2014-12-28 | Disposition: A | Discharge status: Home / Self Care | Payer: PPO | Source: Ambulatory Visit | Attending: Family Medicine | Admitting: Family Medicine

## 2014-12-28 DIAGNOSIS — Z0001 Encounter for general adult medical examination with abnormal findings: Secondary | ICD-10-CM

## 2015-05-18 DIAGNOSIS — Z961 Presence of intraocular lens: Secondary | ICD-10-CM | POA: Diagnosis not present

## 2015-05-18 DIAGNOSIS — H401132 Primary open-angle glaucoma, bilateral, moderate stage: Secondary | ICD-10-CM | POA: Diagnosis not present

## 2015-05-18 DIAGNOSIS — H02054 Trichiasis without entropian left upper eyelid: Secondary | ICD-10-CM | POA: Diagnosis not present

## 2015-05-24 ENCOUNTER — Other Ambulatory Visit: Payer: Self-pay | Admitting: Dermatology

## 2015-05-24 DIAGNOSIS — L409 Psoriasis, unspecified: Secondary | ICD-10-CM | POA: Diagnosis not present

## 2015-05-24 DIAGNOSIS — L570 Actinic keratosis: Secondary | ICD-10-CM | POA: Diagnosis not present

## 2015-05-24 DIAGNOSIS — C44311 Basal cell carcinoma of skin of nose: Secondary | ICD-10-CM | POA: Diagnosis not present

## 2015-08-02 DIAGNOSIS — G4733 Obstructive sleep apnea (adult) (pediatric): Secondary | ICD-10-CM | POA: Diagnosis not present

## 2015-08-04 ENCOUNTER — Other Ambulatory Visit: Payer: Self-pay | Admitting: Dermatology

## 2015-08-04 DIAGNOSIS — C44311 Basal cell carcinoma of skin of nose: Secondary | ICD-10-CM | POA: Diagnosis not present

## 2015-08-04 DIAGNOSIS — L089 Local infection of the skin and subcutaneous tissue, unspecified: Secondary | ICD-10-CM | POA: Diagnosis not present

## 2016-01-02 DIAGNOSIS — M545 Low back pain: Secondary | ICD-10-CM | POA: Diagnosis not present

## 2016-01-02 DIAGNOSIS — K8020 Calculus of gallbladder without cholecystitis without obstruction: Secondary | ICD-10-CM | POA: Diagnosis not present

## 2016-01-02 DIAGNOSIS — N200 Calculus of kidney: Secondary | ICD-10-CM | POA: Diagnosis not present

## 2016-01-23 DIAGNOSIS — D239 Other benign neoplasm of skin, unspecified: Secondary | ICD-10-CM | POA: Diagnosis not present

## 2016-01-23 DIAGNOSIS — L570 Actinic keratosis: Secondary | ICD-10-CM | POA: Diagnosis not present

## 2016-01-23 DIAGNOSIS — L821 Other seborrheic keratosis: Secondary | ICD-10-CM | POA: Diagnosis not present

## 2016-01-24 DIAGNOSIS — I2510 Atherosclerotic heart disease of native coronary artery without angina pectoris: Secondary | ICD-10-CM | POA: Diagnosis not present

## 2016-01-24 DIAGNOSIS — Z0001 Encounter for general adult medical examination with abnormal findings: Secondary | ICD-10-CM | POA: Diagnosis not present

## 2016-01-24 DIAGNOSIS — N400 Enlarged prostate without lower urinary tract symptoms: Secondary | ICD-10-CM | POA: Diagnosis not present

## 2016-01-24 DIAGNOSIS — I10 Essential (primary) hypertension: Secondary | ICD-10-CM | POA: Diagnosis not present

## 2016-01-24 DIAGNOSIS — Z79899 Other long term (current) drug therapy: Secondary | ICD-10-CM | POA: Diagnosis not present

## 2016-01-24 DIAGNOSIS — E7800 Pure hypercholesterolemia, unspecified: Secondary | ICD-10-CM | POA: Diagnosis not present

## 2016-08-07 ENCOUNTER — Other Ambulatory Visit: Payer: Self-pay | Admitting: Dermatology

## 2016-08-07 DIAGNOSIS — L820 Inflamed seborrheic keratosis: Secondary | ICD-10-CM | POA: Diagnosis not present

## 2016-08-07 DIAGNOSIS — L821 Other seborrheic keratosis: Secondary | ICD-10-CM | POA: Diagnosis not present

## 2016-08-07 DIAGNOSIS — D492 Neoplasm of unspecified behavior of bone, soft tissue, and skin: Secondary | ICD-10-CM | POA: Diagnosis not present

## 2016-09-19 DIAGNOSIS — N401 Enlarged prostate with lower urinary tract symptoms: Secondary | ICD-10-CM | POA: Diagnosis not present

## 2016-09-19 DIAGNOSIS — N138 Other obstructive and reflux uropathy: Secondary | ICD-10-CM | POA: Diagnosis not present

## 2016-10-02 DIAGNOSIS — Z961 Presence of intraocular lens: Secondary | ICD-10-CM | POA: Diagnosis not present

## 2016-10-02 DIAGNOSIS — H35351 Cystoid macular degeneration, right eye: Secondary | ICD-10-CM | POA: Diagnosis not present

## 2016-10-02 DIAGNOSIS — H401132 Primary open-angle glaucoma, bilateral, moderate stage: Secondary | ICD-10-CM | POA: Diagnosis not present

## 2016-10-22 DIAGNOSIS — H35072 Retinal telangiectasis, left eye: Secondary | ICD-10-CM | POA: Diagnosis not present

## 2016-10-22 DIAGNOSIS — Z961 Presence of intraocular lens: Secondary | ICD-10-CM | POA: Diagnosis not present

## 2016-10-22 DIAGNOSIS — H35071 Retinal telangiectasis, right eye: Secondary | ICD-10-CM | POA: Diagnosis not present

## 2016-10-22 DIAGNOSIS — H35351 Cystoid macular degeneration, right eye: Secondary | ICD-10-CM | POA: Diagnosis not present

## 2016-11-06 DIAGNOSIS — H35351 Cystoid macular degeneration, right eye: Secondary | ICD-10-CM | POA: Diagnosis not present

## 2016-11-20 DIAGNOSIS — Z23 Encounter for immunization: Secondary | ICD-10-CM | POA: Diagnosis not present

## 2016-11-20 DIAGNOSIS — I252 Old myocardial infarction: Secondary | ICD-10-CM | POA: Diagnosis not present

## 2016-11-20 DIAGNOSIS — Z136 Encounter for screening for cardiovascular disorders: Secondary | ICD-10-CM | POA: Diagnosis not present

## 2016-11-20 DIAGNOSIS — E7800 Pure hypercholesterolemia, unspecified: Secondary | ICD-10-CM | POA: Diagnosis not present

## 2016-11-20 DIAGNOSIS — I2510 Atherosclerotic heart disease of native coronary artery without angina pectoris: Secondary | ICD-10-CM | POA: Diagnosis not present

## 2016-11-20 DIAGNOSIS — N400 Enlarged prostate without lower urinary tract symptoms: Secondary | ICD-10-CM | POA: Diagnosis not present

## 2016-11-20 DIAGNOSIS — Z0001 Encounter for general adult medical examination with abnormal findings: Secondary | ICD-10-CM | POA: Diagnosis not present

## 2016-11-20 DIAGNOSIS — Z79899 Other long term (current) drug therapy: Secondary | ICD-10-CM | POA: Diagnosis not present

## 2016-11-20 DIAGNOSIS — I10 Essential (primary) hypertension: Secondary | ICD-10-CM | POA: Diagnosis not present

## 2016-12-17 DIAGNOSIS — T8529XA Other mechanical complication of intraocular lens, initial encounter: Secondary | ICD-10-CM | POA: Diagnosis not present

## 2016-12-17 DIAGNOSIS — Z961 Presence of intraocular lens: Secondary | ICD-10-CM | POA: Diagnosis not present

## 2016-12-17 DIAGNOSIS — H35351 Cystoid macular degeneration, right eye: Secondary | ICD-10-CM | POA: Diagnosis not present

## 2016-12-17 DIAGNOSIS — T859XXA Unspecified complication of internal prosthetic device, implant and graft, initial encounter: Secondary | ICD-10-CM | POA: Diagnosis not present

## 2017-01-03 DIAGNOSIS — H35351 Cystoid macular degeneration, right eye: Secondary | ICD-10-CM | POA: Diagnosis not present

## 2017-01-03 DIAGNOSIS — Z961 Presence of intraocular lens: Secondary | ICD-10-CM | POA: Diagnosis not present

## 2017-01-03 DIAGNOSIS — H401132 Primary open-angle glaucoma, bilateral, moderate stage: Secondary | ICD-10-CM | POA: Diagnosis not present

## 2017-01-28 DIAGNOSIS — T859XXA Unspecified complication of internal prosthetic device, implant and graft, initial encounter: Secondary | ICD-10-CM | POA: Diagnosis not present

## 2017-01-28 DIAGNOSIS — G4733 Obstructive sleep apnea (adult) (pediatric): Secondary | ICD-10-CM | POA: Diagnosis not present

## 2017-01-28 DIAGNOSIS — T8529XA Other mechanical complication of intraocular lens, initial encounter: Secondary | ICD-10-CM | POA: Diagnosis not present

## 2017-01-28 DIAGNOSIS — H35351 Cystoid macular degeneration, right eye: Secondary | ICD-10-CM | POA: Diagnosis not present

## 2017-02-14 DIAGNOSIS — G4733 Obstructive sleep apnea (adult) (pediatric): Secondary | ICD-10-CM | POA: Diagnosis not present

## 2017-04-22 DIAGNOSIS — G4730 Sleep apnea, unspecified: Secondary | ICD-10-CM | POA: Diagnosis not present

## 2017-04-22 DIAGNOSIS — H35351 Cystoid macular degeneration, right eye: Secondary | ICD-10-CM | POA: Diagnosis not present

## 2017-04-22 DIAGNOSIS — T859XXA Unspecified complication of internal prosthetic device, implant and graft, initial encounter: Secondary | ICD-10-CM | POA: Diagnosis not present

## 2017-04-22 DIAGNOSIS — T8529XA Other mechanical complication of intraocular lens, initial encounter: Secondary | ICD-10-CM | POA: Diagnosis not present

## 2017-05-07 DIAGNOSIS — H401133 Primary open-angle glaucoma, bilateral, severe stage: Secondary | ICD-10-CM | POA: Diagnosis not present

## 2017-05-07 DIAGNOSIS — H02834 Dermatochalasis of left upper eyelid: Secondary | ICD-10-CM | POA: Diagnosis not present

## 2017-05-07 DIAGNOSIS — Z961 Presence of intraocular lens: Secondary | ICD-10-CM | POA: Diagnosis not present

## 2017-05-07 DIAGNOSIS — H26493 Other secondary cataract, bilateral: Secondary | ICD-10-CM | POA: Diagnosis not present

## 2017-05-07 DIAGNOSIS — H35351 Cystoid macular degeneration, right eye: Secondary | ICD-10-CM | POA: Diagnosis not present

## 2017-05-07 DIAGNOSIS — H02831 Dermatochalasis of right upper eyelid: Secondary | ICD-10-CM | POA: Diagnosis not present

## 2017-05-17 DIAGNOSIS — E7800 Pure hypercholesterolemia, unspecified: Secondary | ICD-10-CM | POA: Insufficient documentation

## 2017-05-28 DIAGNOSIS — H02831 Dermatochalasis of right upper eyelid: Secondary | ICD-10-CM | POA: Diagnosis not present

## 2017-05-28 DIAGNOSIS — H02423 Myogenic ptosis of bilateral eyelids: Secondary | ICD-10-CM | POA: Diagnosis not present

## 2017-05-28 DIAGNOSIS — H11433 Conjunctival hyperemia, bilateral: Secondary | ICD-10-CM | POA: Diagnosis not present

## 2017-05-28 DIAGNOSIS — H02135 Senile ectropion of left lower eyelid: Secondary | ICD-10-CM | POA: Diagnosis not present

## 2017-05-28 DIAGNOSIS — H0279 Other degenerative disorders of eyelid and periocular area: Secondary | ICD-10-CM | POA: Diagnosis not present

## 2017-05-28 DIAGNOSIS — H04553 Acquired stenosis of bilateral nasolacrimal duct: Secondary | ICD-10-CM | POA: Diagnosis not present

## 2017-05-28 DIAGNOSIS — H02834 Dermatochalasis of left upper eyelid: Secondary | ICD-10-CM | POA: Diagnosis not present

## 2017-05-28 DIAGNOSIS — H02413 Mechanical ptosis of bilateral eyelids: Secondary | ICD-10-CM | POA: Diagnosis not present

## 2017-05-28 DIAGNOSIS — H409 Unspecified glaucoma: Secondary | ICD-10-CM | POA: Diagnosis present

## 2017-05-28 DIAGNOSIS — H02881 Meibomian gland dysfunction right upper eyelid: Secondary | ICD-10-CM | POA: Diagnosis not present

## 2017-05-28 DIAGNOSIS — H02884 Meibomian gland dysfunction left upper eyelid: Secondary | ICD-10-CM | POA: Diagnosis not present

## 2017-05-28 DIAGNOSIS — H02132 Senile ectropion of right lower eyelid: Secondary | ICD-10-CM | POA: Diagnosis not present

## 2017-06-24 DIAGNOSIS — H02132 Senile ectropion of right lower eyelid: Secondary | ICD-10-CM | POA: Diagnosis not present

## 2017-06-24 DIAGNOSIS — H02112 Cicatricial ectropion of right lower eyelid: Secondary | ICD-10-CM | POA: Diagnosis not present

## 2017-06-24 DIAGNOSIS — H04551 Acquired stenosis of right nasolacrimal duct: Secondary | ICD-10-CM | POA: Diagnosis not present

## 2017-06-24 DIAGNOSIS — H04562 Stenosis of left lacrimal punctum: Secondary | ICD-10-CM | POA: Diagnosis not present

## 2017-06-24 DIAGNOSIS — H04561 Stenosis of right lacrimal punctum: Secondary | ICD-10-CM | POA: Diagnosis not present

## 2017-06-24 DIAGNOSIS — H11431 Conjunctival hyperemia, right eye: Secondary | ICD-10-CM | POA: Diagnosis not present

## 2017-06-24 DIAGNOSIS — H02532 Eyelid retraction right lower eyelid: Secondary | ICD-10-CM | POA: Diagnosis not present

## 2017-07-11 DIAGNOSIS — H02115 Cicatricial ectropion of left lower eyelid: Secondary | ICD-10-CM | POA: Diagnosis not present

## 2017-07-11 DIAGNOSIS — Z09 Encounter for follow-up examination after completed treatment for conditions other than malignant neoplasm: Secondary | ICD-10-CM | POA: Diagnosis not present

## 2017-07-11 DIAGNOSIS — H02535 Eyelid retraction left lower eyelid: Secondary | ICD-10-CM | POA: Diagnosis not present

## 2017-07-11 DIAGNOSIS — H02532 Eyelid retraction right lower eyelid: Secondary | ICD-10-CM | POA: Diagnosis not present

## 2017-07-11 DIAGNOSIS — T8131XS Disruption of external operation (surgical) wound, not elsewhere classified, sequela: Secondary | ICD-10-CM | POA: Diagnosis not present

## 2017-07-11 DIAGNOSIS — H11431 Conjunctival hyperemia, right eye: Secondary | ICD-10-CM | POA: Diagnosis not present

## 2017-07-11 DIAGNOSIS — H02135 Senile ectropion of left lower eyelid: Secondary | ICD-10-CM | POA: Diagnosis not present

## 2017-07-11 DIAGNOSIS — H04552 Acquired stenosis of left nasolacrimal duct: Secondary | ICD-10-CM | POA: Diagnosis not present

## 2017-07-11 DIAGNOSIS — H04562 Stenosis of left lacrimal punctum: Secondary | ICD-10-CM | POA: Diagnosis not present

## 2017-07-11 DIAGNOSIS — S01111S Laceration without foreign body of right eyelid and periocular area, sequela: Secondary | ICD-10-CM | POA: Diagnosis not present

## 2017-07-29 DIAGNOSIS — H02122 Mechanical ectropion of right lower eyelid: Secondary | ICD-10-CM | POA: Diagnosis not present

## 2017-07-29 DIAGNOSIS — H02115 Cicatricial ectropion of left lower eyelid: Secondary | ICD-10-CM | POA: Diagnosis not present

## 2017-07-29 DIAGNOSIS — H02532 Eyelid retraction right lower eyelid: Secondary | ICD-10-CM | POA: Diagnosis not present

## 2017-07-29 DIAGNOSIS — S01111S Laceration without foreign body of right eyelid and periocular area, sequela: Secondary | ICD-10-CM | POA: Diagnosis not present

## 2017-07-29 DIAGNOSIS — H04552 Acquired stenosis of left nasolacrimal duct: Secondary | ICD-10-CM | POA: Diagnosis not present

## 2017-07-29 DIAGNOSIS — H02535 Eyelid retraction left lower eyelid: Secondary | ICD-10-CM | POA: Diagnosis not present

## 2017-07-29 DIAGNOSIS — H04562 Stenosis of left lacrimal punctum: Secondary | ICD-10-CM | POA: Diagnosis not present

## 2017-07-29 DIAGNOSIS — H11431 Conjunctival hyperemia, right eye: Secondary | ICD-10-CM | POA: Diagnosis not present

## 2017-07-29 DIAGNOSIS — H02135 Senile ectropion of left lower eyelid: Secondary | ICD-10-CM | POA: Diagnosis not present

## 2017-07-29 DIAGNOSIS — H02536 Eyelid retraction left eye, unspecified eyelid: Secondary | ICD-10-CM | POA: Diagnosis not present

## 2017-08-13 DIAGNOSIS — H02112 Cicatricial ectropion of right lower eyelid: Secondary | ICD-10-CM | POA: Diagnosis not present

## 2017-08-13 DIAGNOSIS — H02423 Myogenic ptosis of bilateral eyelids: Secondary | ICD-10-CM | POA: Diagnosis not present

## 2017-08-13 DIAGNOSIS — Z09 Encounter for follow-up examination after completed treatment for conditions other than malignant neoplasm: Secondary | ICD-10-CM | POA: Diagnosis not present

## 2017-08-13 DIAGNOSIS — H02115 Cicatricial ectropion of left lower eyelid: Secondary | ICD-10-CM | POA: Diagnosis not present

## 2017-08-16 DIAGNOSIS — G4733 Obstructive sleep apnea (adult) (pediatric): Secondary | ICD-10-CM | POA: Diagnosis not present

## 2017-09-05 DIAGNOSIS — H26493 Other secondary cataract, bilateral: Secondary | ICD-10-CM | POA: Diagnosis not present

## 2017-09-05 DIAGNOSIS — Z961 Presence of intraocular lens: Secondary | ICD-10-CM | POA: Diagnosis not present

## 2017-09-05 DIAGNOSIS — H401133 Primary open-angle glaucoma, bilateral, severe stage: Secondary | ICD-10-CM | POA: Diagnosis not present

## 2017-09-05 DIAGNOSIS — H16223 Keratoconjunctivitis sicca, not specified as Sjogren's, bilateral: Secondary | ICD-10-CM | POA: Diagnosis not present

## 2017-10-14 DIAGNOSIS — H02115 Cicatricial ectropion of left lower eyelid: Secondary | ICD-10-CM | POA: Diagnosis not present

## 2017-10-14 DIAGNOSIS — Z09 Encounter for follow-up examination after completed treatment for conditions other than malignant neoplasm: Secondary | ICD-10-CM | POA: Diagnosis not present

## 2017-10-14 DIAGNOSIS — H02423 Myogenic ptosis of bilateral eyelids: Secondary | ICD-10-CM | POA: Diagnosis not present

## 2017-10-14 DIAGNOSIS — H02413 Mechanical ptosis of bilateral eyelids: Secondary | ICD-10-CM | POA: Diagnosis not present

## 2017-10-14 DIAGNOSIS — H02112 Cicatricial ectropion of right lower eyelid: Secondary | ICD-10-CM | POA: Diagnosis not present

## 2017-10-15 DIAGNOSIS — H53483 Generalized contraction of visual field, bilateral: Secondary | ICD-10-CM | POA: Diagnosis not present

## 2017-10-21 DIAGNOSIS — T8529XA Other mechanical complication of intraocular lens, initial encounter: Secondary | ICD-10-CM | POA: Diagnosis not present

## 2017-10-21 DIAGNOSIS — T859XXA Unspecified complication of internal prosthetic device, implant and graft, initial encounter: Secondary | ICD-10-CM | POA: Diagnosis not present

## 2017-10-21 DIAGNOSIS — G4733 Obstructive sleep apnea (adult) (pediatric): Secondary | ICD-10-CM | POA: Diagnosis not present

## 2017-10-21 DIAGNOSIS — H35351 Cystoid macular degeneration, right eye: Secondary | ICD-10-CM | POA: Diagnosis not present

## 2017-11-14 DIAGNOSIS — G4733 Obstructive sleep apnea (adult) (pediatric): Secondary | ICD-10-CM | POA: Diagnosis not present

## 2017-11-26 DIAGNOSIS — I2510 Atherosclerotic heart disease of native coronary artery without angina pectoris: Secondary | ICD-10-CM | POA: Diagnosis not present

## 2017-11-26 DIAGNOSIS — I10 Essential (primary) hypertension: Secondary | ICD-10-CM | POA: Diagnosis not present

## 2017-11-26 DIAGNOSIS — E7800 Pure hypercholesterolemia, unspecified: Secondary | ICD-10-CM | POA: Diagnosis not present

## 2017-11-26 DIAGNOSIS — Z01818 Encounter for other preprocedural examination: Secondary | ICD-10-CM | POA: Diagnosis not present

## 2017-12-03 DIAGNOSIS — Z09 Encounter for follow-up examination after completed treatment for conditions other than malignant neoplasm: Secondary | ICD-10-CM | POA: Diagnosis not present

## 2017-12-03 DIAGNOSIS — H57813 Brow ptosis, bilateral: Secondary | ICD-10-CM | POA: Diagnosis not present

## 2017-12-03 DIAGNOSIS — H02115 Cicatricial ectropion of left lower eyelid: Secondary | ICD-10-CM | POA: Diagnosis not present

## 2017-12-03 DIAGNOSIS — H02423 Myogenic ptosis of bilateral eyelids: Secondary | ICD-10-CM | POA: Diagnosis not present

## 2017-12-03 DIAGNOSIS — H02834 Dermatochalasis of left upper eyelid: Secondary | ICD-10-CM | POA: Diagnosis not present

## 2017-12-03 DIAGNOSIS — H02831 Dermatochalasis of right upper eyelid: Secondary | ICD-10-CM | POA: Diagnosis not present

## 2017-12-03 DIAGNOSIS — H02413 Mechanical ptosis of bilateral eyelids: Secondary | ICD-10-CM | POA: Diagnosis not present

## 2017-12-03 DIAGNOSIS — H02112 Cicatricial ectropion of right lower eyelid: Secondary | ICD-10-CM | POA: Diagnosis not present

## 2017-12-10 DIAGNOSIS — Z4802 Encounter for removal of sutures: Secondary | ICD-10-CM | POA: Diagnosis not present

## 2017-12-10 DIAGNOSIS — Z09 Encounter for follow-up examination after completed treatment for conditions other than malignant neoplasm: Secondary | ICD-10-CM | POA: Diagnosis not present

## 2017-12-15 DIAGNOSIS — G4733 Obstructive sleep apnea (adult) (pediatric): Secondary | ICD-10-CM | POA: Diagnosis not present

## 2017-12-16 DIAGNOSIS — G4733 Obstructive sleep apnea (adult) (pediatric): Secondary | ICD-10-CM | POA: Diagnosis not present

## 2018-01-03 DIAGNOSIS — Z23 Encounter for immunization: Secondary | ICD-10-CM | POA: Diagnosis not present

## 2018-01-07 DIAGNOSIS — H26493 Other secondary cataract, bilateral: Secondary | ICD-10-CM | POA: Diagnosis not present

## 2018-01-07 DIAGNOSIS — H16223 Keratoconjunctivitis sicca, not specified as Sjogren's, bilateral: Secondary | ICD-10-CM | POA: Diagnosis not present

## 2018-01-07 DIAGNOSIS — H401213 Low-tension glaucoma, right eye, severe stage: Secondary | ICD-10-CM | POA: Diagnosis not present

## 2018-01-07 DIAGNOSIS — H401133 Primary open-angle glaucoma, bilateral, severe stage: Secondary | ICD-10-CM | POA: Diagnosis not present

## 2018-01-07 DIAGNOSIS — Z961 Presence of intraocular lens: Secondary | ICD-10-CM | POA: Diagnosis not present

## 2018-01-14 DIAGNOSIS — G4733 Obstructive sleep apnea (adult) (pediatric): Secondary | ICD-10-CM | POA: Diagnosis not present

## 2018-01-21 DIAGNOSIS — I10 Essential (primary) hypertension: Secondary | ICD-10-CM | POA: Diagnosis not present

## 2018-01-21 DIAGNOSIS — Z79899 Other long term (current) drug therapy: Secondary | ICD-10-CM | POA: Diagnosis not present

## 2018-01-21 DIAGNOSIS — R531 Weakness: Secondary | ICD-10-CM | POA: Diagnosis not present

## 2018-01-22 ENCOUNTER — Ambulatory Visit
Admission: RE | Admit: 2018-01-22 | Discharge: 2018-01-22 | Disposition: A | Discharge status: Home / Self Care | Payer: PPO | Source: Ambulatory Visit | Attending: Family Medicine | Admitting: Family Medicine

## 2018-01-22 ENCOUNTER — Other Ambulatory Visit: Payer: Self-pay | Admitting: Family Medicine

## 2018-01-22 DIAGNOSIS — R531 Weakness: Secondary | ICD-10-CM

## 2018-01-22 DIAGNOSIS — R918 Other nonspecific abnormal finding of lung field: Secondary | ICD-10-CM | POA: Diagnosis not present

## 2018-01-28 DIAGNOSIS — H401133 Primary open-angle glaucoma, bilateral, severe stage: Secondary | ICD-10-CM | POA: Diagnosis not present

## 2018-01-28 DIAGNOSIS — H16223 Keratoconjunctivitis sicca, not specified as Sjogren's, bilateral: Secondary | ICD-10-CM | POA: Diagnosis not present

## 2018-02-03 DIAGNOSIS — D225 Melanocytic nevi of trunk: Secondary | ICD-10-CM | POA: Diagnosis not present

## 2018-02-03 DIAGNOSIS — C4441 Basal cell carcinoma of skin of scalp and neck: Secondary | ICD-10-CM | POA: Diagnosis not present

## 2018-02-03 DIAGNOSIS — C44329 Squamous cell carcinoma of skin of other parts of face: Secondary | ICD-10-CM | POA: Diagnosis not present

## 2018-02-03 DIAGNOSIS — L814 Other melanin hyperpigmentation: Secondary | ICD-10-CM | POA: Diagnosis not present

## 2018-02-03 DIAGNOSIS — L570 Actinic keratosis: Secondary | ICD-10-CM | POA: Diagnosis not present

## 2018-02-03 DIAGNOSIS — D1801 Hemangioma of skin and subcutaneous tissue: Secondary | ICD-10-CM | POA: Diagnosis not present

## 2018-02-03 DIAGNOSIS — L821 Other seborrheic keratosis: Secondary | ICD-10-CM | POA: Diagnosis not present

## 2018-02-03 DIAGNOSIS — G4733 Obstructive sleep apnea (adult) (pediatric): Secondary | ICD-10-CM | POA: Diagnosis not present

## 2018-02-05 ENCOUNTER — Other Ambulatory Visit: Payer: Self-pay | Admitting: Family Medicine

## 2018-02-05 ENCOUNTER — Ambulatory Visit
Admission: RE | Admit: 2018-02-05 | Discharge: 2018-02-05 | Disposition: A | Discharge status: Home / Self Care | Payer: PPO | Source: Ambulatory Visit | Attending: Family Medicine | Admitting: Family Medicine

## 2018-02-05 DIAGNOSIS — J189 Pneumonia, unspecified organism: Secondary | ICD-10-CM

## 2018-02-14 DIAGNOSIS — G4733 Obstructive sleep apnea (adult) (pediatric): Secondary | ICD-10-CM | POA: Diagnosis not present

## 2018-03-14 DIAGNOSIS — C4441 Basal cell carcinoma of skin of scalp and neck: Secondary | ICD-10-CM | POA: Diagnosis not present

## 2018-03-16 DIAGNOSIS — G4733 Obstructive sleep apnea (adult) (pediatric): Secondary | ICD-10-CM | POA: Diagnosis not present

## 2018-03-28 DIAGNOSIS — C44329 Squamous cell carcinoma of skin of other parts of face: Secondary | ICD-10-CM | POA: Diagnosis not present

## 2018-04-16 DIAGNOSIS — G4733 Obstructive sleep apnea (adult) (pediatric): Secondary | ICD-10-CM | POA: Diagnosis not present

## 2018-05-15 DIAGNOSIS — H401133 Primary open-angle glaucoma, bilateral, severe stage: Secondary | ICD-10-CM | POA: Diagnosis not present

## 2018-05-15 DIAGNOSIS — H16223 Keratoconjunctivitis sicca, not specified as Sjogren's, bilateral: Secondary | ICD-10-CM | POA: Diagnosis not present

## 2018-05-15 DIAGNOSIS — H0102B Squamous blepharitis left eye, upper and lower eyelids: Secondary | ICD-10-CM | POA: Diagnosis not present

## 2018-05-15 DIAGNOSIS — H0102A Squamous blepharitis right eye, upper and lower eyelids: Secondary | ICD-10-CM | POA: Diagnosis not present

## 2018-05-17 DIAGNOSIS — G4733 Obstructive sleep apnea (adult) (pediatric): Secondary | ICD-10-CM | POA: Diagnosis not present

## 2018-05-20 DIAGNOSIS — Z0001 Encounter for general adult medical examination with abnormal findings: Secondary | ICD-10-CM | POA: Diagnosis not present

## 2018-05-20 DIAGNOSIS — N400 Enlarged prostate without lower urinary tract symptoms: Secondary | ICD-10-CM | POA: Diagnosis not present

## 2018-05-20 DIAGNOSIS — Z79899 Other long term (current) drug therapy: Secondary | ICD-10-CM | POA: Diagnosis not present

## 2018-05-20 DIAGNOSIS — E7800 Pure hypercholesterolemia, unspecified: Secondary | ICD-10-CM | POA: Diagnosis not present

## 2018-05-20 DIAGNOSIS — I10 Essential (primary) hypertension: Secondary | ICD-10-CM | POA: Diagnosis not present

## 2018-05-20 DIAGNOSIS — I2510 Atherosclerotic heart disease of native coronary artery without angina pectoris: Secondary | ICD-10-CM | POA: Diagnosis not present

## 2018-05-29 DIAGNOSIS — H16223 Keratoconjunctivitis sicca, not specified as Sjogren's, bilateral: Secondary | ICD-10-CM | POA: Diagnosis not present

## 2018-05-29 DIAGNOSIS — H0102A Squamous blepharitis right eye, upper and lower eyelids: Secondary | ICD-10-CM | POA: Diagnosis not present

## 2018-05-29 DIAGNOSIS — H0102B Squamous blepharitis left eye, upper and lower eyelids: Secondary | ICD-10-CM | POA: Diagnosis not present

## 2018-05-29 DIAGNOSIS — H401133 Primary open-angle glaucoma, bilateral, severe stage: Secondary | ICD-10-CM | POA: Diagnosis not present

## 2018-06-12 DIAGNOSIS — H0102A Squamous blepharitis right eye, upper and lower eyelids: Secondary | ICD-10-CM | POA: Diagnosis not present

## 2018-06-12 DIAGNOSIS — H401133 Primary open-angle glaucoma, bilateral, severe stage: Secondary | ICD-10-CM | POA: Diagnosis not present

## 2018-06-12 DIAGNOSIS — H0102B Squamous blepharitis left eye, upper and lower eyelids: Secondary | ICD-10-CM | POA: Diagnosis not present

## 2018-06-12 DIAGNOSIS — H16223 Keratoconjunctivitis sicca, not specified as Sjogren's, bilateral: Secondary | ICD-10-CM | POA: Diagnosis not present

## 2018-08-11 DIAGNOSIS — H401133 Primary open-angle glaucoma, bilateral, severe stage: Secondary | ICD-10-CM | POA: Diagnosis not present

## 2018-08-11 DIAGNOSIS — H16223 Keratoconjunctivitis sicca, not specified as Sjogren's, bilateral: Secondary | ICD-10-CM | POA: Diagnosis not present

## 2018-08-11 DIAGNOSIS — H0102A Squamous blepharitis right eye, upper and lower eyelids: Secondary | ICD-10-CM | POA: Diagnosis not present

## 2018-08-11 DIAGNOSIS — H0102B Squamous blepharitis left eye, upper and lower eyelids: Secondary | ICD-10-CM | POA: Diagnosis not present

## 2018-08-18 DIAGNOSIS — L814 Other melanin hyperpigmentation: Secondary | ICD-10-CM | POA: Diagnosis not present

## 2018-08-18 DIAGNOSIS — L218 Other seborrheic dermatitis: Secondary | ICD-10-CM | POA: Diagnosis not present

## 2018-08-18 DIAGNOSIS — L820 Inflamed seborrheic keratosis: Secondary | ICD-10-CM | POA: Diagnosis not present

## 2018-08-18 DIAGNOSIS — L821 Other seborrheic keratosis: Secondary | ICD-10-CM | POA: Diagnosis not present

## 2018-08-18 DIAGNOSIS — D225 Melanocytic nevi of trunk: Secondary | ICD-10-CM | POA: Diagnosis not present

## 2018-08-18 DIAGNOSIS — Z85828 Personal history of other malignant neoplasm of skin: Secondary | ICD-10-CM | POA: Diagnosis not present

## 2018-08-18 DIAGNOSIS — G4733 Obstructive sleep apnea (adult) (pediatric): Secondary | ICD-10-CM | POA: Diagnosis not present

## 2018-08-18 DIAGNOSIS — D1801 Hemangioma of skin and subcutaneous tissue: Secondary | ICD-10-CM | POA: Diagnosis not present

## 2018-10-07 DIAGNOSIS — G4733 Obstructive sleep apnea (adult) (pediatric): Secondary | ICD-10-CM | POA: Diagnosis not present

## 2018-11-11 DIAGNOSIS — H401133 Primary open-angle glaucoma, bilateral, severe stage: Secondary | ICD-10-CM | POA: Diagnosis not present

## 2018-11-11 DIAGNOSIS — H0102A Squamous blepharitis right eye, upper and lower eyelids: Secondary | ICD-10-CM | POA: Diagnosis not present

## 2018-11-11 DIAGNOSIS — H16223 Keratoconjunctivitis sicca, not specified as Sjogren's, bilateral: Secondary | ICD-10-CM | POA: Diagnosis not present

## 2018-11-11 DIAGNOSIS — H0102B Squamous blepharitis left eye, upper and lower eyelids: Secondary | ICD-10-CM | POA: Diagnosis not present

## 2018-11-15 DIAGNOSIS — G4733 Obstructive sleep apnea (adult) (pediatric): Secondary | ICD-10-CM | POA: Diagnosis not present

## 2018-12-16 DIAGNOSIS — G4733 Obstructive sleep apnea (adult) (pediatric): Secondary | ICD-10-CM | POA: Diagnosis not present

## 2018-12-25 DIAGNOSIS — D485 Neoplasm of uncertain behavior of skin: Secondary | ICD-10-CM | POA: Diagnosis not present

## 2018-12-25 DIAGNOSIS — R208 Other disturbances of skin sensation: Secondary | ICD-10-CM | POA: Diagnosis not present

## 2018-12-25 DIAGNOSIS — B079 Viral wart, unspecified: Secondary | ICD-10-CM | POA: Diagnosis not present

## 2018-12-25 DIAGNOSIS — L820 Inflamed seborrheic keratosis: Secondary | ICD-10-CM | POA: Diagnosis not present

## 2019-01-06 DIAGNOSIS — Z23 Encounter for immunization: Secondary | ICD-10-CM | POA: Diagnosis not present

## 2019-01-21 DIAGNOSIS — K111 Hypertrophy of salivary gland: Secondary | ICD-10-CM | POA: Diagnosis not present

## 2019-02-05 DIAGNOSIS — L905 Scar conditions and fibrosis of skin: Secondary | ICD-10-CM | POA: Diagnosis not present

## 2019-02-05 DIAGNOSIS — L820 Inflamed seborrheic keratosis: Secondary | ICD-10-CM | POA: Diagnosis not present

## 2019-02-05 DIAGNOSIS — R208 Other disturbances of skin sensation: Secondary | ICD-10-CM | POA: Diagnosis not present

## 2019-03-10 DIAGNOSIS — H0102B Squamous blepharitis left eye, upper and lower eyelids: Secondary | ICD-10-CM | POA: Diagnosis not present

## 2019-03-10 DIAGNOSIS — H16223 Keratoconjunctivitis sicca, not specified as Sjogren's, bilateral: Secondary | ICD-10-CM | POA: Diagnosis not present

## 2019-03-10 DIAGNOSIS — H401133 Primary open-angle glaucoma, bilateral, severe stage: Secondary | ICD-10-CM | POA: Diagnosis not present

## 2019-03-10 DIAGNOSIS — H0102A Squamous blepharitis right eye, upper and lower eyelids: Secondary | ICD-10-CM | POA: Diagnosis not present

## 2019-04-07 DIAGNOSIS — U071 COVID-19: Secondary | ICD-10-CM | POA: Diagnosis not present

## 2019-04-10 DIAGNOSIS — U071 COVID-19: Secondary | ICD-10-CM | POA: Diagnosis not present

## 2019-05-26 DIAGNOSIS — I252 Old myocardial infarction: Secondary | ICD-10-CM | POA: Diagnosis not present

## 2019-05-26 DIAGNOSIS — Z0001 Encounter for general adult medical examination with abnormal findings: Secondary | ICD-10-CM | POA: Diagnosis not present

## 2019-05-26 DIAGNOSIS — E7800 Pure hypercholesterolemia, unspecified: Secondary | ICD-10-CM | POA: Diagnosis not present

## 2019-05-26 DIAGNOSIS — N400 Enlarged prostate without lower urinary tract symptoms: Secondary | ICD-10-CM | POA: Diagnosis not present

## 2019-05-26 DIAGNOSIS — I2510 Atherosclerotic heart disease of native coronary artery without angina pectoris: Secondary | ICD-10-CM | POA: Diagnosis not present

## 2019-05-26 DIAGNOSIS — I10 Essential (primary) hypertension: Secondary | ICD-10-CM | POA: Diagnosis not present

## 2019-05-26 DIAGNOSIS — Z79899 Other long term (current) drug therapy: Secondary | ICD-10-CM | POA: Diagnosis not present

## 2019-05-29 DIAGNOSIS — G4733 Obstructive sleep apnea (adult) (pediatric): Secondary | ICD-10-CM | POA: Diagnosis not present

## 2019-07-07 DIAGNOSIS — H16223 Keratoconjunctivitis sicca, not specified as Sjogren's, bilateral: Secondary | ICD-10-CM | POA: Diagnosis not present

## 2019-07-07 DIAGNOSIS — H0102B Squamous blepharitis left eye, upper and lower eyelids: Secondary | ICD-10-CM | POA: Diagnosis not present

## 2019-07-07 DIAGNOSIS — H0102A Squamous blepharitis right eye, upper and lower eyelids: Secondary | ICD-10-CM | POA: Diagnosis not present

## 2019-07-07 DIAGNOSIS — H401133 Primary open-angle glaucoma, bilateral, severe stage: Secondary | ICD-10-CM | POA: Diagnosis not present

## 2019-08-07 ENCOUNTER — Other Ambulatory Visit (INDEPENDENT_AMBULATORY_CARE_PROVIDER_SITE_OTHER): Payer: Self-pay | Admitting: Ophthalmology

## 2019-10-19 DIAGNOSIS — M545 Low back pain: Secondary | ICD-10-CM | POA: Diagnosis not present

## 2019-10-30 DIAGNOSIS — Z125 Encounter for screening for malignant neoplasm of prostate: Secondary | ICD-10-CM | POA: Diagnosis not present

## 2019-10-30 DIAGNOSIS — R109 Unspecified abdominal pain: Secondary | ICD-10-CM | POA: Diagnosis not present

## 2019-11-05 DIAGNOSIS — L821 Other seborrheic keratosis: Secondary | ICD-10-CM | POA: Diagnosis not present

## 2019-11-05 DIAGNOSIS — D485 Neoplasm of uncertain behavior of skin: Secondary | ICD-10-CM | POA: Diagnosis not present

## 2019-11-05 DIAGNOSIS — L400 Psoriasis vulgaris: Secondary | ICD-10-CM | POA: Diagnosis not present

## 2019-11-05 DIAGNOSIS — Z85828 Personal history of other malignant neoplasm of skin: Secondary | ICD-10-CM | POA: Diagnosis not present

## 2019-11-10 DIAGNOSIS — H0102A Squamous blepharitis right eye, upper and lower eyelids: Secondary | ICD-10-CM | POA: Diagnosis not present

## 2019-11-10 DIAGNOSIS — H16223 Keratoconjunctivitis sicca, not specified as Sjogren's, bilateral: Secondary | ICD-10-CM | POA: Diagnosis not present

## 2019-11-10 DIAGNOSIS — H0102B Squamous blepharitis left eye, upper and lower eyelids: Secondary | ICD-10-CM | POA: Diagnosis not present

## 2019-11-10 DIAGNOSIS — H401133 Primary open-angle glaucoma, bilateral, severe stage: Secondary | ICD-10-CM | POA: Diagnosis not present

## 2019-12-01 DIAGNOSIS — M545 Low back pain: Secondary | ICD-10-CM | POA: Diagnosis not present

## 2019-12-01 DIAGNOSIS — N529 Male erectile dysfunction, unspecified: Secondary | ICD-10-CM | POA: Diagnosis not present

## 2019-12-01 DIAGNOSIS — R351 Nocturia: Secondary | ICD-10-CM | POA: Diagnosis not present

## 2019-12-01 DIAGNOSIS — N401 Enlarged prostate with lower urinary tract symptoms: Secondary | ICD-10-CM | POA: Diagnosis not present

## 2020-01-21 DIAGNOSIS — Z23 Encounter for immunization: Secondary | ICD-10-CM | POA: Diagnosis not present

## 2020-03-08 DIAGNOSIS — H0102B Squamous blepharitis left eye, upper and lower eyelids: Secondary | ICD-10-CM | POA: Diagnosis not present

## 2020-03-08 DIAGNOSIS — H0102A Squamous blepharitis right eye, upper and lower eyelids: Secondary | ICD-10-CM | POA: Diagnosis not present

## 2020-03-08 DIAGNOSIS — H16223 Keratoconjunctivitis sicca, not specified as Sjogren's, bilateral: Secondary | ICD-10-CM | POA: Diagnosis not present

## 2020-03-08 DIAGNOSIS — H401133 Primary open-angle glaucoma, bilateral, severe stage: Secondary | ICD-10-CM | POA: Diagnosis not present

## 2020-04-20 IMAGING — DX DG CHEST 2V
2 series · 2 of 2 positions shown · non-contrast
Comparison: Chest x-ray 02/21/2014.

CLINICAL DATA: Generalized weakness.

EXAM:
CHEST - 2 VIEW

[dg chest 2 view (1 of 2)]
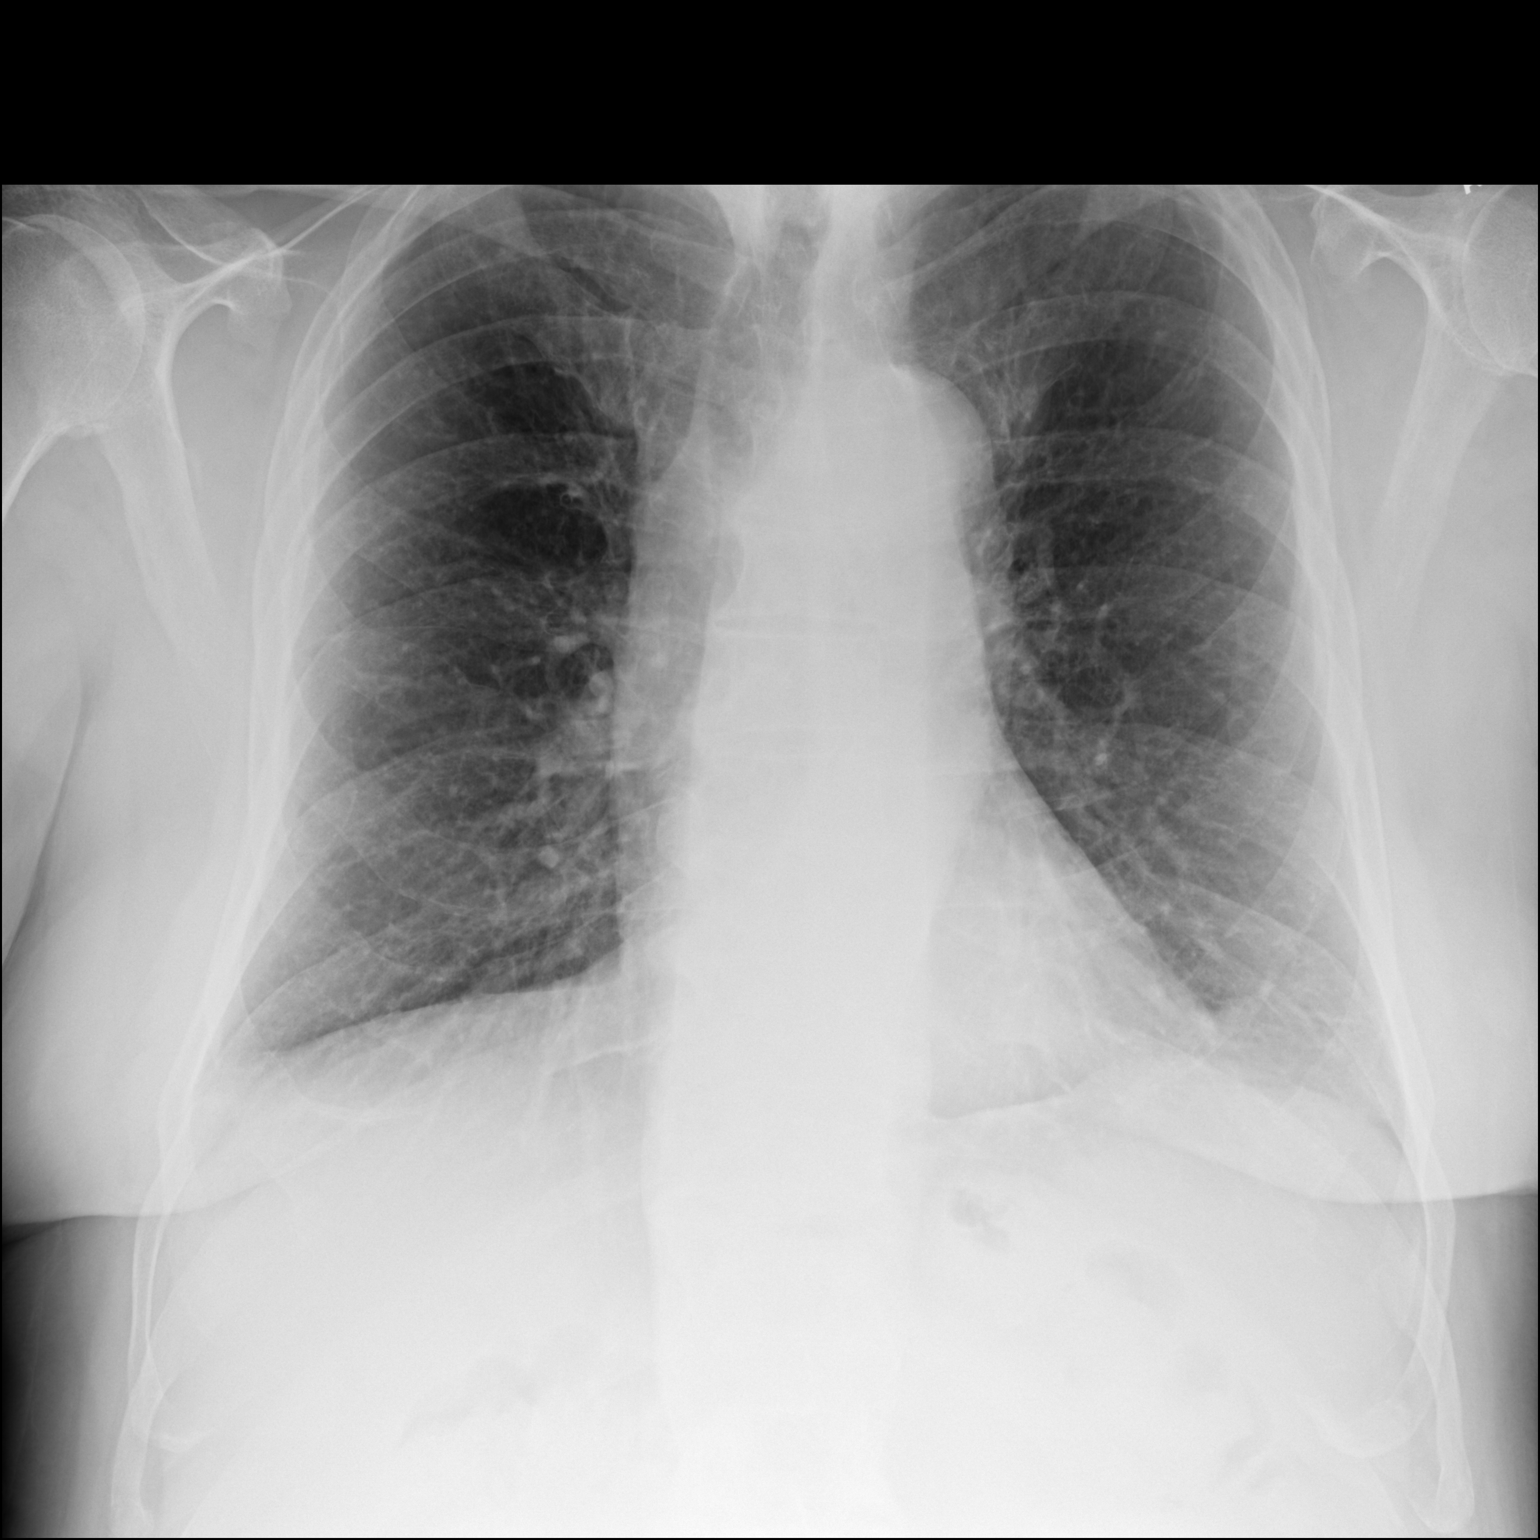

[dg chest 2 view (2 of 2)]
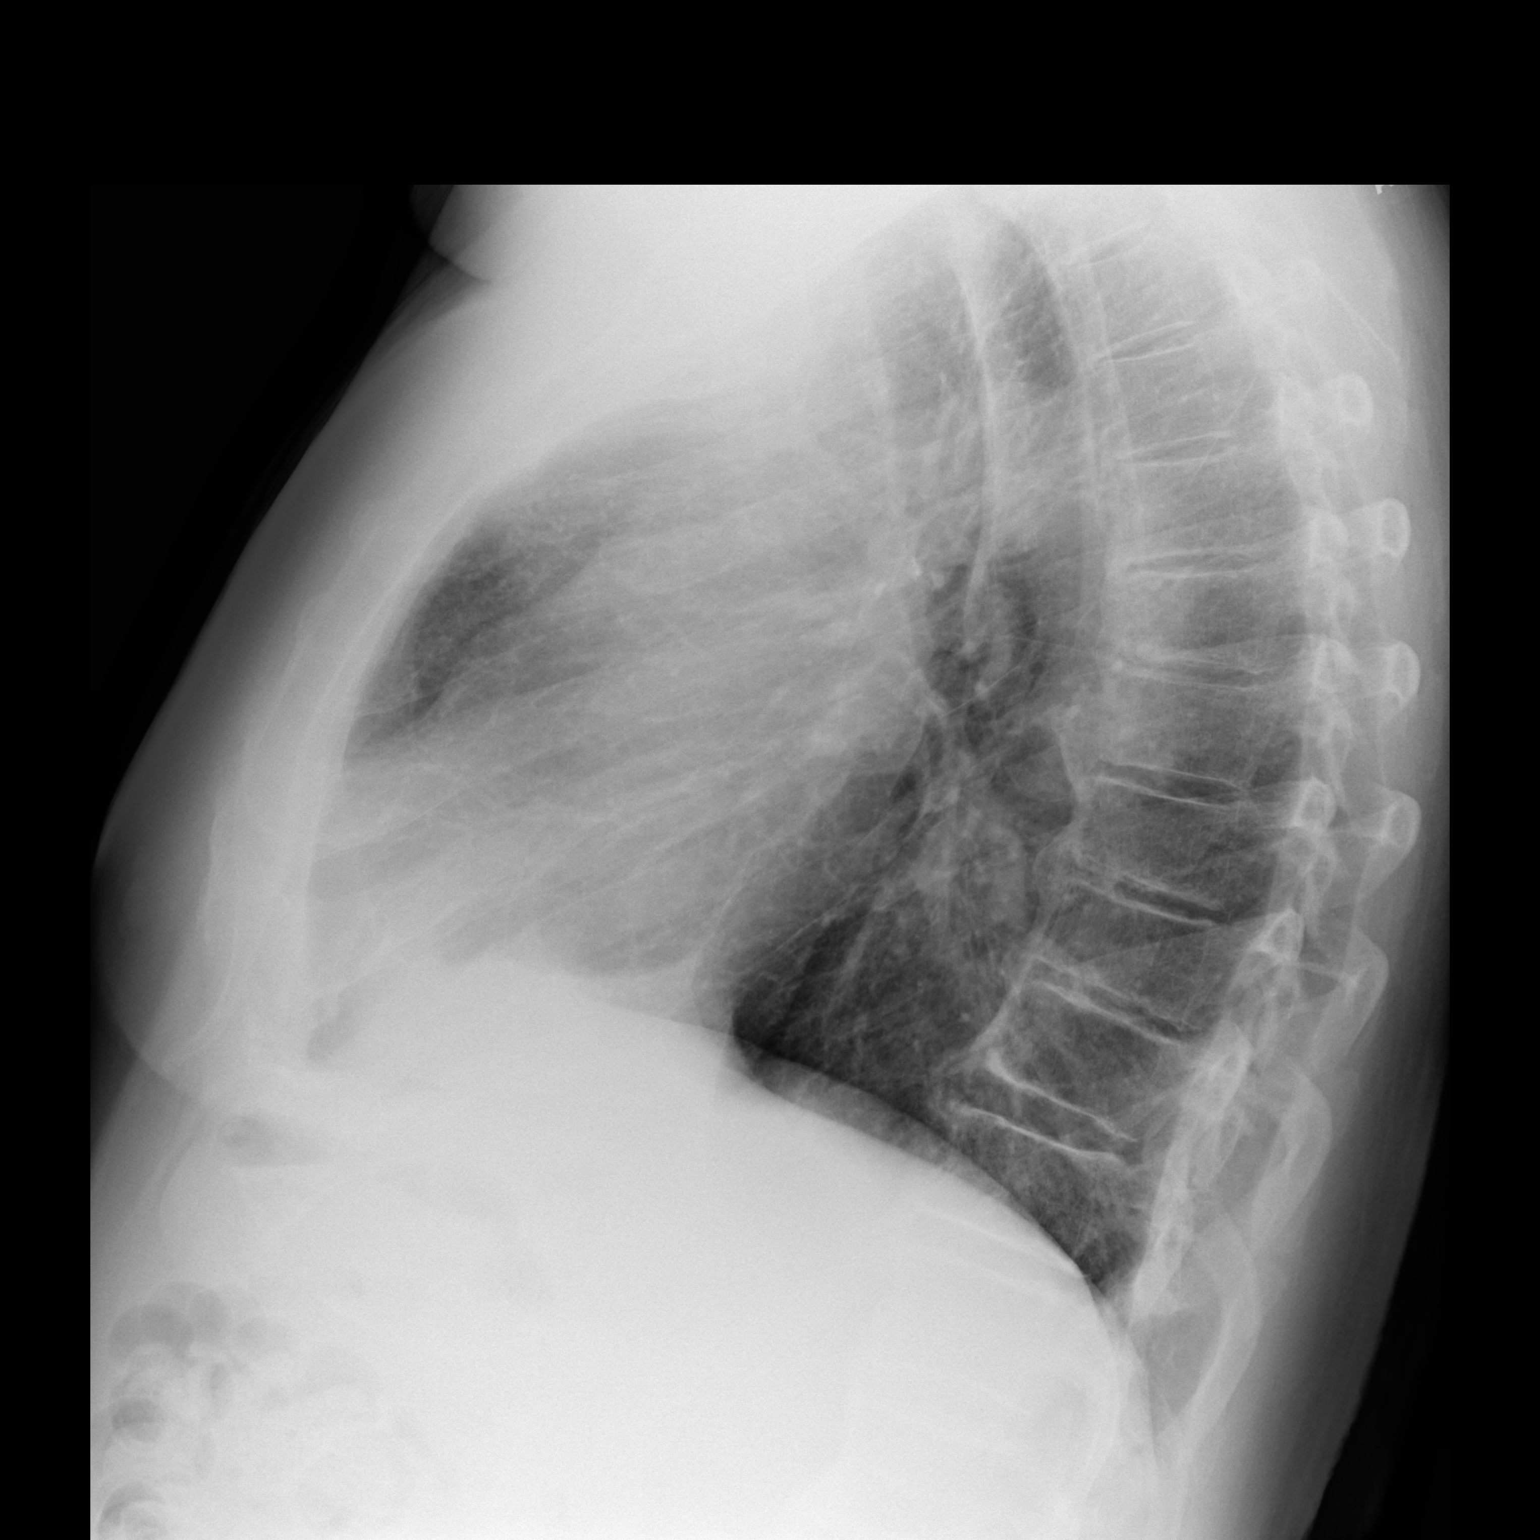

[2 of 2 positions shown; findings below may reference images not displayed]

FINDINGS: Mediastinum and hilar structures normal. Heart size normal. Mild
left base infiltrate suggesting pneumonia. No pleural effusion or
pneumothorax.. Biapical pleural thickening consistent scarring.
Degenerative change thoracic spine.
IMPRESSION: Mild left base infiltrate suggesting pneumonia.

## 2020-06-08 DIAGNOSIS — L400 Psoriasis vulgaris: Secondary | ICD-10-CM | POA: Diagnosis not present

## 2020-06-08 DIAGNOSIS — D0471 Carcinoma in situ of skin of right lower limb, including hip: Secondary | ICD-10-CM | POA: Diagnosis not present

## 2020-06-08 DIAGNOSIS — L570 Actinic keratosis: Secondary | ICD-10-CM | POA: Diagnosis not present

## 2020-06-08 DIAGNOSIS — L820 Inflamed seborrheic keratosis: Secondary | ICD-10-CM | POA: Diagnosis not present

## 2020-06-08 DIAGNOSIS — L821 Other seborrheic keratosis: Secondary | ICD-10-CM | POA: Diagnosis not present

## 2020-06-08 DIAGNOSIS — L218 Other seborrheic dermatitis: Secondary | ICD-10-CM | POA: Diagnosis not present

## 2020-06-13 DIAGNOSIS — Z79899 Other long term (current) drug therapy: Secondary | ICD-10-CM | POA: Diagnosis not present

## 2020-06-13 DIAGNOSIS — E7800 Pure hypercholesterolemia, unspecified: Secondary | ICD-10-CM | POA: Diagnosis not present

## 2020-06-13 DIAGNOSIS — I2510 Atherosclerotic heart disease of native coronary artery without angina pectoris: Secondary | ICD-10-CM | POA: Diagnosis not present

## 2020-06-13 DIAGNOSIS — Z0001 Encounter for general adult medical examination with abnormal findings: Secondary | ICD-10-CM | POA: Diagnosis not present

## 2020-06-13 DIAGNOSIS — N400 Enlarged prostate without lower urinary tract symptoms: Secondary | ICD-10-CM | POA: Diagnosis not present

## 2020-06-13 DIAGNOSIS — I252 Old myocardial infarction: Secondary | ICD-10-CM | POA: Diagnosis not present

## 2020-06-13 DIAGNOSIS — I10 Essential (primary) hypertension: Secondary | ICD-10-CM | POA: Diagnosis not present

## 2020-07-08 DIAGNOSIS — H0102A Squamous blepharitis right eye, upper and lower eyelids: Secondary | ICD-10-CM | POA: Diagnosis not present

## 2020-07-08 DIAGNOSIS — H16223 Keratoconjunctivitis sicca, not specified as Sjogren's, bilateral: Secondary | ICD-10-CM | POA: Diagnosis not present

## 2020-07-08 DIAGNOSIS — H401133 Primary open-angle glaucoma, bilateral, severe stage: Secondary | ICD-10-CM | POA: Diagnosis not present

## 2020-07-08 DIAGNOSIS — H0102B Squamous blepharitis left eye, upper and lower eyelids: Secondary | ICD-10-CM | POA: Diagnosis not present

## 2020-09-05 DIAGNOSIS — G4733 Obstructive sleep apnea (adult) (pediatric): Secondary | ICD-10-CM | POA: Diagnosis not present

## 2020-10-20 ENCOUNTER — Emergency Department (HOSPITAL_BASED_OUTPATIENT_CLINIC_OR_DEPARTMENT_OTHER): Payer: PPO

## 2020-10-20 ENCOUNTER — Encounter (HOSPITAL_BASED_OUTPATIENT_CLINIC_OR_DEPARTMENT_OTHER): Payer: Self-pay

## 2020-10-20 ENCOUNTER — Emergency Department (HOSPITAL_BASED_OUTPATIENT_CLINIC_OR_DEPARTMENT_OTHER)
Admission: EM | Admit: 2020-10-20 | Discharge: 2020-10-20 | Disposition: A | Discharge status: Home / Self Care | Payer: PPO | Attending: Emergency Medicine | Admitting: Emergency Medicine

## 2020-10-20 ENCOUNTER — Other Ambulatory Visit: Payer: Self-pay

## 2020-10-20 DIAGNOSIS — F17210 Nicotine dependence, cigarettes, uncomplicated: Secondary | ICD-10-CM | POA: Insufficient documentation

## 2020-10-20 DIAGNOSIS — R0602 Shortness of breath: Secondary | ICD-10-CM | POA: Diagnosis not present

## 2020-10-20 DIAGNOSIS — R531 Weakness: Secondary | ICD-10-CM

## 2020-10-20 DIAGNOSIS — R5383 Other fatigue: Secondary | ICD-10-CM | POA: Insufficient documentation

## 2020-10-20 DIAGNOSIS — Z20822 Contact with and (suspected) exposure to covid-19: Secondary | ICD-10-CM | POA: Insufficient documentation

## 2020-10-20 DIAGNOSIS — I10 Essential (primary) hypertension: Secondary | ICD-10-CM | POA: Diagnosis not present

## 2020-10-20 DIAGNOSIS — Z79899 Other long term (current) drug therapy: Secondary | ICD-10-CM | POA: Insufficient documentation

## 2020-10-20 DIAGNOSIS — Z7902 Long term (current) use of antithrombotics/antiplatelets: Secondary | ICD-10-CM | POA: Insufficient documentation

## 2020-10-20 DIAGNOSIS — I959 Hypotension, unspecified: Secondary | ICD-10-CM | POA: Diagnosis not present

## 2020-10-20 DIAGNOSIS — R42 Dizziness and giddiness: Secondary | ICD-10-CM | POA: Diagnosis not present

## 2020-10-20 DIAGNOSIS — R079 Chest pain, unspecified: Secondary | ICD-10-CM | POA: Diagnosis not present

## 2020-10-20 DIAGNOSIS — R9431 Abnormal electrocardiogram [ECG] [EKG]: Secondary | ICD-10-CM | POA: Diagnosis not present

## 2020-10-20 LAB — BASIC METABOLIC PANEL
Anion gap: 8 (ref 5–15)
BUN: 17 mg/dL (ref 8–23)
CO2: 24 mmol/L (ref 22–32)
Calcium: 9.3 mg/dL (ref 8.9–10.3)
Chloride: 106 mmol/L (ref 98–111)
Creatinine, Ser: 1.34 mg/dL — ABNORMAL HIGH (ref 0.61–1.24)
GFR, Estimated: 52 mL/min — ABNORMAL LOW (ref >60)
Glucose, Bld: 101 mg/dL — ABNORMAL HIGH (ref 70–99)
Potassium: 4 mmol/L (ref 3.5–5.1)
Sodium: 138 mmol/L (ref 135–145)

## 2020-10-20 LAB — URINALYSIS, ROUTINE W REFLEX MICROSCOPIC
Bilirubin Urine: NEGATIVE
Glucose, UA: NEGATIVE mg/dL
Hgb urine dipstick: NEGATIVE
Nitrite: NEGATIVE
Protein, ur: 30 mg/dL — AB
Specific Gravity, Urine: 1.035 — ABNORMAL HIGH (ref 1.005–1.030)
pH: 5.5 (ref 5.0–8.0)

## 2020-10-20 LAB — CBC
HCT: 43.3 % (ref 39.0–52.0)
Hemoglobin: 13.8 g/dL (ref 13.0–17.0)
MCH: 25.6 pg — ABNORMAL LOW (ref 26.0–34.0)
MCHC: 31.9 g/dL (ref 30.0–36.0)
MCV: 80.2 fL (ref 80.0–100.0)
Platelets: 210 10*3/uL (ref 150–400)
RBC: 5.4 MIL/uL (ref 4.22–5.81)
RDW: 15.9 % — ABNORMAL HIGH (ref 11.5–15.5)
WBC: 9.9 10*3/uL (ref 4.0–10.5)
nRBC: 0 % (ref 0.0–0.2)

## 2020-10-20 LAB — TROPONIN I (HIGH SENSITIVITY)
Troponin I (High Sensitivity): 4 ng/L (ref <18)
Troponin I (High Sensitivity): 4 ng/L (ref <18)

## 2020-10-20 NOTE — ED Provider Notes (Signed)
[facility] EMERGENCY DEPT Provider Note   CSN: [id number] Arrival date & time: 10/20/20  1901     History Chief Complaint  Patient presents with   Weakness   Shortness of Breath    Glenn Patel is a 87 y.o. male.  Pt c/o feeling generally weak/fatigued in past couple days. Symptoms acute onset, moderate, persistent. Felt mildly lightheaded earlier. Was seen at [facility], and referred to ED for evaluation. Denies headache. No  focal or unilateral numbness or weakness. No change in speech or vision. No problems w balance or gait. Denies sore throat, cough or uri symptoms. Mild dyspnea w exertion. No cp or discomfort w exertion. Denies abd pain or nvd. Normal appetite. Denies dysuria or gu c/o. No extremity pain, swelling. No skin lesions/ulcers. Denies change in meds or new meds. Denies any recent blood loss, melena or rectal bleeding.   The history is provided by the patient.  Weakness Associated symptoms: no abdominal pain, no chest pain, no cough, no diarrhea, no dysuria, no fever, no headaches and no vomiting   Shortness of Breath Associated symptoms: no abdominal pain, no chest pain, no cough, no fever, no headaches, no neck pain, no rash, no sore throat and no vomiting       Past Medical History:  Diagnosis Date   Hyperlipidemia    Hypertension    Urinary retention due to benign prostatic hyperplasia     Patient Active Problem List   Diagnosis Date Noted   Leukocytosis 02/22/2014   Blood poisoning    E-coli UTI 02/21/2014   Sepsis ([place]) 02/21/2014   Urinary retention 02/21/2014   Hypokalemia 02/21/2014   Hypotension 02/21/2014   Hyperlipidemia 02/21/2014   Fever     History reviewed. No pertinent surgical history.     History reviewed. No pertinent family history.  Social History   Tobacco Use   Smoking status: Some Days    Types: Cigarettes   Smokeless tobacco: Never  Vaping Use   Vaping Use: Never used  Substance Use Topics   Alcohol  use: No   Drug use: No    Home Medications Prior to Admission medications   Medication Sig Start Date End Date Taking? Authorizing Provider  ciprofloxacin (CIPRO) 500 MG tablet Take 1 tablet (500 mg total) by mouth 2 (two) times daily. 02/24/14   Theodis Blaze, MD  clopidogrel (PLAVIX) 75 MG tablet Take 75 mg by mouth every morning.     [provider]  dorzolamide-timolol (COSOPT) 22.3-6.8 MG/ML ophthalmic solution Place 1 drop into both eyes 2 (two) times daily.  01/24/14   [provider]  finasteride (PROSCAR) 5 MG tablet Take 5 mg by mouth every morning.     [provider]  isosorbide mononitrate (IMDUR) 60 MG 24 hr tablet Take 60 mg by mouth every morning.     [provider]  ketorolac (ACULAR) 0.5 % ophthalmic solution INSTILL 1 DROP INTO RIGHT EYE FOUR TIMES A DAY 08/08/19   Rankin, Clent Demark, MD  latanoprost (XALATAN) 0.005 % ophthalmic solution Place 1 drop into both eyes 2 (two) times daily.  02/04/14   [provider]  losartan (COZAAR) 100 MG tablet Take 100 mg by mouth every morning.     [provider]  oxyCODONE (OXY IR/ROXICODONE) 5 MG immediate release tablet Take 1 tablet (5 mg total) by mouth every 4 (four) hours as needed for moderate pain. 02/24/14   Theodis Blaze, MD  simvastatin (ZOCOR) 40 MG tablet Take  40 mg by mouth every morning.     [provider]  tamsulosin (FLOMAX) 0.4 MG CAPS capsule Take 0.4 mg by mouth every morning.     [provider]  triamcinolone cream (KENALOG) 0.1 % Apply 1 application topically 2 (two) times daily as needed (psoriasis).  12/14/13   [provider]    Allergies    Patient has no known allergies.  Review of Systems   Review of Systems  Constitutional:  Negative for chills and fever.  HENT:  Negative for sore throat.   Eyes:  Negative for redness and visual disturbance.  Respiratory:  Negative for cough.   Cardiovascular:  Negative for chest pain and  leg swelling.  Gastrointestinal:  Negative for abdominal pain, blood in stool, diarrhea and vomiting.  Genitourinary:  Negative for dysuria and flank pain.  Musculoskeletal:  Negative for back pain and neck pain.  Skin:  Negative for rash.  Neurological:  Positive for weakness. Negative for speech difficulty, numbness and headaches.  Hematological:  Does not bruise/bleed easily.  Psychiatric/Behavioral:  Negative for confusion.    Physical Exam Updated Vital Signs BP 136/86 (BP Location: Right Arm)   Pulse 69   Temp 98.3 F (36.8 C) (Oral)   Resp 20   Ht 1.829 m (6')   Wt 98.4 kg   SpO2 98%   BMI 29.43 kg/m   Physical Exam Vitals and nursing note reviewed.  Constitutional:      Appearance: Normal appearance. He is well-developed.  HENT:     Head: Atraumatic.     Nose: Nose normal.     Mouth/Throat:     Mouth: Mucous membranes are moist.     Pharynx: Oropharynx is clear.  Eyes:     General: No scleral icterus.    Conjunctiva/sclera: Conjunctivae normal.     Pupils: Pupils are equal, round, and reactive to light.  Neck:     Vascular: No carotid bruit.     Trachea: No tracheal deviation.     Comments: No stiffness or rigidity.  Cardiovascular:     Rate and Rhythm: Normal rate and regular rhythm.     Pulses: Normal pulses.     Heart sounds: Normal heart sounds. No murmur heard.   No friction rub. No gallop.  Pulmonary:     Effort: Pulmonary effort is normal. No accessory muscle usage or respiratory distress.     Breath sounds: Normal breath sounds.  Abdominal:     General: Bowel sounds are normal. There is no distension.     Palpations: Abdomen is soft. There is no mass.     Tenderness: There is no abdominal tenderness. There is no guarding.  Genitourinary:    Comments: No cva tenderness. Musculoskeletal:        General: No swelling or tenderness.     Cervical back: Normal range of motion and neck supple. No rigidity or tenderness.     Right lower leg: No edema.      Left lower leg: No edema.  Skin:    General: Skin is warm and dry.     Findings: No rash.  Neurological:     Mental Status: He is alert.     Comments: Alert, speech clear. Motor/sens grossly intact bil. Steady gait.   Psychiatric:        Mood and Affect: Mood normal.    ED Results / Procedures / Treatments   Labs (all labs ordered are listed, but only abnormal results are displayed) Results for  orders placed or performed during the hospital encounter of 10/20/20  CBC  Result Value Ref Range   WBC 9.9 4.0 - 10.5 K/uL   RBC 5.40 4.22 - 5.81 MIL/uL   Hemoglobin 13.8 13.0 - 17.0 g/dL   HCT 43.3 39.0 - 52.0 %   MCV 80.2 80.0 - 100.0 fL   MCH 25.6 (L) 26.0 - 34.0 pg   MCHC 31.9 30.0 - 36.0 g/dL   RDW 15.9 (H) 11.5 - 15.5 %   Platelets 210 150 - 400 K/uL   nRBC 0.0 0.0 - 0.2 %  Basic metabolic panel  Result Value Ref Range   Sodium 138 135 - 145 mmol/L   Potassium 4.0 3.5 - 5.1 mmol/L   Chloride 106 98 - 111 mmol/L   CO2 24 22 - 32 mmol/L   Glucose, Bld 101 (H) 70 - 99 mg/dL   BUN 17 8 - 23 mg/dL   Creatinine, Ser 1.34 (H) 0.61 - 1.24 mg/dL   Calcium 9.3 8.9 - 10.3 mg/dL   GFR, Estimated 52 (L) >60 mL/min   Anion gap 8 5 - 15  Urinalysis, Routine w reflex microscopic Urine, Clean Catch  Result Value Ref Range   Color, Urine YELLOW YELLOW   APPearance CLEAR CLEAR   Specific Gravity, Urine 1.035 (H) 1.005 - 1.030   pH 5.5 5.0 - 8.0   Glucose, UA NEGATIVE NEGATIVE mg/dL   Hgb urine dipstick NEGATIVE NEGATIVE   Bilirubin Urine NEGATIVE NEGATIVE   Ketones, ur TRACE (A) NEGATIVE mg/dL   Protein, ur 30 (A) NEGATIVE mg/dL   Nitrite NEGATIVE NEGATIVE   Leukocytes,Ua TRACE (A) NEGATIVE   RBC / HPF 0-5 0 - 5 RBC/hpf   WBC, UA 0-5 0 - 5 WBC/hpf   Bacteria, UA RARE (A) NONE SEEN   Squamous Epithelial / LPF 0-5 0 - 5   Mucus PRESENT    Hyaline Casts, UA PRESENT    Ca Oxalate Crys, UA PRESENT   Troponin I (High Sensitivity)  Result Value Ref Range   Troponin I (High  Sensitivity) 4 <18 ng/L  Troponin I (High Sensitivity)  Result Value Ref Range   Troponin I (High Sensitivity) 4 <18 ng/L   DG Chest 1 View  Result Date: 10/20/2020 CLINICAL DATA:  Chest pain.  Shortness of breath EXAM: CHEST  1 VIEW COMPARISON:  Radiograph 02/05/2018 FINDINGS: Chronic hyperinflation. Interstitial coarsening. Stable heart size and mediastinal contours. No confluent airspace disease. No pneumothorax or pleural effusion. Thoracic spondylosis. No acute osseous abnormalities are seen. IMPRESSION: Chronic hyperinflation and interstitial coarsening. No superimposed acute abnormality. Electronically Signed   By: Keith Rake M.D.   On: 10/20/2020 20:42     EKG EKG Interpretation  Date/Time:  Thursday October 20 2020 19:18:52 EDT Ventricular Rate:  76 PR Interval:  220 QRS Duration: 70 QT Interval:  344 QTC Calculation: 387 R Axis:   55 Text Interpretation: Sinus rhythm Confirmed by Lajean Saver [phone]) on 10/20/2020 7:30:20 PM  Radiology DG Chest 1 View  Result Date: 10/20/2020 CLINICAL DATA:  Chest pain.  Shortness of breath EXAM: CHEST  1 VIEW COMPARISON:  Radiograph 02/05/2018 FINDINGS: Chronic hyperinflation. Interstitial coarsening. Stable heart size and mediastinal contours. No confluent airspace disease. No pneumothorax or pleural effusion. Thoracic spondylosis. No acute osseous abnormalities are seen. IMPRESSION: Chronic hyperinflation and interstitial coarsening. No superimposed acute abnormality. Electronically Signed   By: Keith Rake M.D.   On: 10/20/2020 20:42    Procedures Procedures   Medications Ordered in ED  Medications - No data to display  ED Course  I have reviewed the triage vital signs and the nursing notes.  Pertinent labs & imaging results that were available during my care of the patient were reviewed by me and considered in my medical decision making (see chart for details).    MDM Rules/Calculators/A&P                          Labs  sent.   Reviewed nursing notes and prior charts for additional history.   Bp normal. No faintness. Po fluids.   Labs reviewed/interpreted by me - wbc and hgb normal.  Trop normal.   CXR reviewed/interpreted by me - no pna.   Additional labs reviewed/interpreted by me - delta trop normal.   No chest pain or sob. Normal vitals. Tolerating po.   Ambulates in hall. No faintness or dizziness.   Patient currently appears stable for d/c.   Return precautions provided.      Final Clinical Impression(s) / ED Diagnoses Final diagnoses:  Chest pain    Rx / DC Orders ED Discharge Orders     None        Lajean Saver, MD 10/20/20 2337

## 2020-10-20 NOTE — ED Triage Notes (Signed)
Pt states he went to  [facility] today and he was sent him here  -  states he has been having SOB and fatigue.   Also states he passed out last Friday due to  his low BP.   Denies fever / GCS 15. No signs of distress at this time

## 2020-10-20 NOTE — ED Notes (Signed)
Ambulated patient in hallway. Patient had strong gate and no dizziness. HR 74  Oxygen 98%

## 2020-10-20 NOTE — Discharge Instructions (Addendum)
It was our pleasure to provide your ER care today - we hope that you feel better.  Overall your lab tests look good.   May sure to drink plenty of fluids/stay well hydrated.   Your covid/flu test is pending and results should be available by tomorrow AM - you may check MyChart or call for results.   Follow up with primary care doctor in the next 3-4 days if symptoms fail to improve/resolve.  Return to ER if worse, new symptoms, fevers, chest pain, trouble breathing, weak/fainting, or other concern.

## 2020-10-21 LAB — SARS CORONAVIRUS 2 (TAT 6-24 HRS): SARS Coronavirus 2: NEGATIVE

## 2020-10-31 DIAGNOSIS — I951 Orthostatic hypotension: Secondary | ICD-10-CM | POA: Diagnosis not present

## 2020-10-31 DIAGNOSIS — R42 Dizziness and giddiness: Secondary | ICD-10-CM | POA: Diagnosis not present

## 2020-11-08 DIAGNOSIS — H401133 Primary open-angle glaucoma, bilateral, severe stage: Secondary | ICD-10-CM | POA: Diagnosis not present

## 2020-11-08 DIAGNOSIS — H16223 Keratoconjunctivitis sicca, not specified as Sjogren's, bilateral: Secondary | ICD-10-CM | POA: Diagnosis not present

## 2020-11-08 DIAGNOSIS — H0102A Squamous blepharitis right eye, upper and lower eyelids: Secondary | ICD-10-CM | POA: Diagnosis not present

## 2020-11-08 DIAGNOSIS — H0102B Squamous blepharitis left eye, upper and lower eyelids: Secondary | ICD-10-CM | POA: Diagnosis not present

## 2020-11-16 DIAGNOSIS — R55 Syncope and collapse: Secondary | ICD-10-CM | POA: Diagnosis not present

## 2020-11-16 DIAGNOSIS — I2510 Atherosclerotic heart disease of native coronary artery without angina pectoris: Secondary | ICD-10-CM | POA: Diagnosis not present

## 2020-11-16 DIAGNOSIS — I10 Essential (primary) hypertension: Secondary | ICD-10-CM | POA: Diagnosis not present

## 2020-12-15 DIAGNOSIS — I10 Essential (primary) hypertension: Secondary | ICD-10-CM | POA: Diagnosis not present

## 2020-12-20 DIAGNOSIS — Z23 Encounter for immunization: Secondary | ICD-10-CM | POA: Diagnosis not present

## 2021-01-25 ENCOUNTER — Encounter: Payer: Self-pay | Admitting: Cardiovascular Disease

## 2021-01-25 ENCOUNTER — Ambulatory Visit: Payer: PPO | Admitting: Cardiovascular Disease

## 2021-01-25 ENCOUNTER — Other Ambulatory Visit: Payer: Self-pay

## 2021-01-25 VITALS — BP 106/66 | HR 61 | Ht 72.0 in | Wt 217.4 lb

## 2021-01-25 DIAGNOSIS — E785 Hyperlipidemia, unspecified: Secondary | ICD-10-CM | POA: Diagnosis not present

## 2021-01-25 DIAGNOSIS — I2510 Atherosclerotic heart disease of native coronary artery without angina pectoris: Secondary | ICD-10-CM | POA: Diagnosis not present

## 2021-01-25 NOTE — Patient Instructions (Signed)

## 2021-01-25 NOTE — Progress Notes (Signed)
01/25/2021 Glenn Patel   1933-08-10  [id number]  Primary Physician Lujean Amel, MD Primary Cardiologist: Lorretta Harp MD Lupe Carney, [place]  HPI:  Glenn Patel is a 87 y.o. moderately overweight married Caucasian male father of 60, grandfather of 6 grandchildren referred by Dr.Koirala for cardiovascular evaluation after patient was seen in the emergency room in July for shortness of breath and weakness.  He is retired from working in Personal assistant.  His risk factors include tobacco abuse but he only smokes 2 cigarettes a week on Mondays and Fridays after his 2 drinks on Mondays and Fridays.  He does have treated hyperlipidemia.  There is no family history of heart disease.  He is never had a heart attack or stroke.  He was seen in the ER 10/20/2020 and evaluated by Dr. Ashok Cordia  for shortness of breath and weakness.  Evaluation was unrevealing.  He denies chest pain or shortness of breath.  Does complain of some orthostatic symptoms when waking up in the morning.   Current Meds  Medication Sig   azithromycin (AZASITE) 1 % ophthalmic solution Place 1 drop into both eyes daily.   brimonidine-timolol (COMBIGAN) 0.2-0.5 % ophthalmic solution Combigan 0.2 %-0.5 % eye drops   cycloSPORINE (RESTASIS) 0.05 % ophthalmic emulsion Restasis 0.05 % eye drops in a dropperette   ketorolac (ACULAR) 0.5 % ophthalmic solution INSTILL 1 DROP INTO RIGHT EYE FOUR TIMES A DAY   RHOPRESSA 0.02 % SOLN    simvastatin (ZOCOR) 40 MG tablet Take 40 mg by mouth every morning.      No Known Allergies  Social History   Socioeconomic History   Marital status: Married    Spouse name: Not on file   Number of children: Not on file   Years of education: Not on file   Highest education level: Not on file  Occupational History   Not on file  Tobacco Use   Smoking status: Some Days    Types: Cigarettes   Smokeless tobacco: Never  Vaping Use   Vaping Use: Never used  Substance and Sexual Activity    Alcohol use: No   Drug use: No   Sexual activity: Not on file  Other Topics Concern   Not on file  Social History Narrative   Not on file   Social Determinants of Health   Financial Resource Strain: Not on file  Food Insecurity: Not on file  Transportation Needs: Not on file  Physical Activity: Not on file  Stress: Not on file  Social Connections: Not on file  Intimate Partner Violence: Not on file     Review of Systems: General: negative for chills, fever, night sweats or weight changes.  Cardiovascular: negative for chest pain, dyspnea on exertion, edema, orthopnea, palpitations, paroxysmal nocturnal dyspnea or shortness of breath Dermatological: negative for rash Respiratory: negative for cough or wheezing Urologic: negative for hematuria Abdominal: negative for nausea, vomiting, diarrhea, bright red blood per rectum, melena, or hematemesis Neurologic: negative for visual changes, syncope, or dizziness All other systems reviewed and are otherwise negative except as noted above.    Blood pressure 106/66, pulse 61, height 6' (1.829 m), weight 217 lb 6.4 oz (98.6 kg), SpO2 98 %.  General appearance: alert and no distress Neck: no adenopathy, no carotid bruit, no JVD, supple, symmetrical, trachea midline, and thyroid not enlarged, symmetric, no tenderness/mass/nodules Lungs: clear to auscultation bilaterally Heart: regular rate and rhythm, S1, S2 normal, no murmur, click, rub or gallop  Extremities: extremities normal, atraumatic, no cyanosis or edema Pulses: 2+ and symmetric Skin: Skin color, texture, turgor normal. No rashes or lesions Neurologic: Grossly normal  EKG sinus rhythm at 61 with first-degree AV block.  I personally reviewed this EKG.  ASSESSMENT AND PLAN:   Hyperlipidemia History of hyperlipidemia on statin therapy with lipid profile performed 06/13/2020 revealing total cholesterol of 100, LDL 47 and HDL 40.     Lorretta Harp MD FACP,FACC,FAHA,  [facility] 01/25/2021 11:54 AM

## 2021-01-25 NOTE — Assessment & Plan Note (Signed)
History of hyperlipidemia on statin therapy with lipid profile performed 06/13/2020 revealing total cholesterol of 100, LDL 47 and HDL 40.

## 2021-03-06 DIAGNOSIS — G4733 Obstructive sleep apnea (adult) (pediatric): Secondary | ICD-10-CM | POA: Diagnosis not present

## 2021-03-14 DIAGNOSIS — H0102B Squamous blepharitis left eye, upper and lower eyelids: Secondary | ICD-10-CM | POA: Diagnosis not present

## 2021-03-14 DIAGNOSIS — H16223 Keratoconjunctivitis sicca, not specified as Sjogren's, bilateral: Secondary | ICD-10-CM | POA: Diagnosis not present

## 2021-03-14 DIAGNOSIS — H0102A Squamous blepharitis right eye, upper and lower eyelids: Secondary | ICD-10-CM | POA: Diagnosis not present

## 2021-03-14 DIAGNOSIS — H401133 Primary open-angle glaucoma, bilateral, severe stage: Secondary | ICD-10-CM | POA: Diagnosis not present

## 2021-05-19 ENCOUNTER — Other Ambulatory Visit (INDEPENDENT_AMBULATORY_CARE_PROVIDER_SITE_OTHER): Payer: Self-pay | Admitting: Ophthalmology

## 2021-07-20 DIAGNOSIS — Z79899 Other long term (current) drug therapy: Secondary | ICD-10-CM | POA: Diagnosis not present

## 2021-07-20 DIAGNOSIS — I252 Old myocardial infarction: Secondary | ICD-10-CM | POA: Diagnosis not present

## 2021-07-20 DIAGNOSIS — Z0000 Encounter for general adult medical examination without abnormal findings: Secondary | ICD-10-CM | POA: Diagnosis not present

## 2021-07-20 DIAGNOSIS — N400 Enlarged prostate without lower urinary tract symptoms: Secondary | ICD-10-CM | POA: Diagnosis not present

## 2021-07-20 DIAGNOSIS — I2510 Atherosclerotic heart disease of native coronary artery without angina pectoris: Secondary | ICD-10-CM | POA: Diagnosis not present

## 2021-07-20 DIAGNOSIS — I10 Essential (primary) hypertension: Secondary | ICD-10-CM | POA: Diagnosis not present

## 2021-07-21 DIAGNOSIS — H401133 Primary open-angle glaucoma, bilateral, severe stage: Secondary | ICD-10-CM | POA: Diagnosis not present

## 2021-07-21 DIAGNOSIS — H26492 Other secondary cataract, left eye: Secondary | ICD-10-CM | POA: Diagnosis not present

## 2021-08-14 DIAGNOSIS — L821 Other seborrheic keratosis: Secondary | ICD-10-CM | POA: Diagnosis not present

## 2021-08-14 DIAGNOSIS — C44319 Basal cell carcinoma of skin of other parts of face: Secondary | ICD-10-CM | POA: Diagnosis not present

## 2021-08-14 DIAGNOSIS — D485 Neoplasm of uncertain behavior of skin: Secondary | ICD-10-CM | POA: Diagnosis not present

## 2021-08-14 DIAGNOSIS — L570 Actinic keratosis: Secondary | ICD-10-CM | POA: Diagnosis not present

## 2021-08-14 DIAGNOSIS — C44311 Basal cell carcinoma of skin of nose: Secondary | ICD-10-CM | POA: Diagnosis not present

## 2021-08-14 DIAGNOSIS — D225 Melanocytic nevi of trunk: Secondary | ICD-10-CM | POA: Diagnosis not present

## 2021-08-14 DIAGNOSIS — Z85828 Personal history of other malignant neoplasm of skin: Secondary | ICD-10-CM | POA: Diagnosis not present

## 2021-09-19 DIAGNOSIS — C44311 Basal cell carcinoma of skin of nose: Secondary | ICD-10-CM | POA: Diagnosis not present

## 2021-09-19 DIAGNOSIS — Z85828 Personal history of other malignant neoplasm of skin: Secondary | ICD-10-CM | POA: Diagnosis not present

## 2021-10-13 DIAGNOSIS — G4733 Obstructive sleep apnea (adult) (pediatric): Secondary | ICD-10-CM | POA: Diagnosis not present

## 2021-10-20 DIAGNOSIS — G4733 Obstructive sleep apnea (adult) (pediatric): Secondary | ICD-10-CM | POA: Diagnosis not present

## 2021-11-03 DIAGNOSIS — H00024 Hordeolum internum left upper eyelid: Secondary | ICD-10-CM | POA: Diagnosis not present

## 2021-11-03 DIAGNOSIS — H10502 Unspecified blepharoconjunctivitis, left eye: Secondary | ICD-10-CM | POA: Diagnosis not present

## 2021-11-03 DIAGNOSIS — H0102B Squamous blepharitis left eye, upper and lower eyelids: Secondary | ICD-10-CM | POA: Diagnosis not present

## 2021-11-24 DIAGNOSIS — H26492 Other secondary cataract, left eye: Secondary | ICD-10-CM | POA: Diagnosis not present

## 2021-11-24 DIAGNOSIS — H35351 Cystoid macular degeneration, right eye: Secondary | ICD-10-CM | POA: Diagnosis not present

## 2021-11-24 DIAGNOSIS — H10502 Unspecified blepharoconjunctivitis, left eye: Secondary | ICD-10-CM | POA: Diagnosis not present

## 2021-11-24 DIAGNOSIS — H401133 Primary open-angle glaucoma, bilateral, severe stage: Secondary | ICD-10-CM | POA: Diagnosis not present

## 2021-11-24 DIAGNOSIS — H0102B Squamous blepharitis left eye, upper and lower eyelids: Secondary | ICD-10-CM | POA: Diagnosis not present

## 2021-11-24 DIAGNOSIS — H0102A Squamous blepharitis right eye, upper and lower eyelids: Secondary | ICD-10-CM | POA: Diagnosis not present

## 2021-11-24 DIAGNOSIS — Z961 Presence of intraocular lens: Secondary | ICD-10-CM | POA: Diagnosis not present

## 2022-01-23 DIAGNOSIS — Z23 Encounter for immunization: Secondary | ICD-10-CM | POA: Diagnosis not present

## 2022-02-10 ENCOUNTER — Encounter (HOSPITAL_COMMUNITY): Payer: Self-pay

## 2022-02-10 ENCOUNTER — Other Ambulatory Visit: Payer: Self-pay

## 2022-02-10 ENCOUNTER — Emergency Department (HOSPITAL_COMMUNITY)
Admission: EM | Admit: 2022-02-10 | Discharge: 2022-02-10 | Disposition: A | Discharge status: Home / Self Care | Payer: PPO | Attending: Emergency Medicine | Admitting: Emergency Medicine

## 2022-02-10 DIAGNOSIS — R339 Retention of urine, unspecified: Secondary | ICD-10-CM | POA: Diagnosis not present

## 2022-02-10 DIAGNOSIS — R197 Diarrhea, unspecified: Secondary | ICD-10-CM | POA: Insufficient documentation

## 2022-02-10 DIAGNOSIS — R110 Nausea: Secondary | ICD-10-CM | POA: Diagnosis not present

## 2022-02-10 DIAGNOSIS — D72829 Elevated white blood cell count, unspecified: Secondary | ICD-10-CM | POA: Insufficient documentation

## 2022-02-10 DIAGNOSIS — N3001 Acute cystitis with hematuria: Secondary | ICD-10-CM | POA: Insufficient documentation

## 2022-02-10 DIAGNOSIS — R310 Gross hematuria: Secondary | ICD-10-CM | POA: Diagnosis not present

## 2022-02-10 DIAGNOSIS — R338 Other retention of urine: Secondary | ICD-10-CM | POA: Diagnosis not present

## 2022-02-10 DIAGNOSIS — N401 Enlarged prostate with lower urinary tract symptoms: Secondary | ICD-10-CM | POA: Diagnosis not present

## 2022-02-10 LAB — BASIC METABOLIC PANEL
Anion gap: 7 (ref 5–15)
BUN: 16 mg/dL (ref 8–23)
CO2: 20 mmol/L — ABNORMAL LOW (ref 22–32)
Calcium: 9 mg/dL (ref 8.9–10.3)
Chloride: 111 mmol/L (ref 98–111)
Creatinine, Ser: 1.17 mg/dL (ref 0.61–1.24)
GFR, Estimated: >60 mL/min (ref >60)
Glucose, Bld: 94 mg/dL (ref 70–99)
Potassium: 4 mmol/L (ref 3.5–5.1)
Sodium: 138 mmol/L (ref 135–145)

## 2022-02-10 LAB — CBC
HCT: 45.3 % (ref 39.0–52.0)
Hemoglobin: 14.7 g/dL (ref 13.0–17.0)
MCH: 27.2 pg (ref 26.0–34.0)
MCHC: 32.5 g/dL (ref 30.0–36.0)
MCV: 83.9 fL (ref 80.0–100.0)
Platelets: 175 10*3/uL (ref 150–400)
RBC: 5.4 MIL/uL (ref 4.22–5.81)
RDW: 15.6 % — ABNORMAL HIGH (ref 11.5–15.5)
WBC: 13.7 10*3/uL — ABNORMAL HIGH (ref 4.0–10.5)
nRBC: 0 % (ref 0.0–0.2)

## 2022-02-10 LAB — URINALYSIS, ROUTINE W REFLEX MICROSCOPIC
Glucose, UA: NEGATIVE mg/dL
Ketones, ur: 15 mg/dL — AB
Nitrite: POSITIVE — AB
Protein, ur: >300 mg/dL — AB
Specific Gravity, Urine: 1.015 (ref 1.005–1.030)
pH: 6.5 (ref 5.0–8.0)

## 2022-02-10 LAB — URINALYSIS, MICROSCOPIC (REFLEX): RBC / HPF: >50 RBC/hpf (ref 0–5)

## 2022-02-10 MED ORDER — CEPHALEXIN 500 MG PO CAPS
500.0000 mg | ORAL_CAPSULE | Freq: Once | ORAL | Status: AC
  Administered 2022-02-10: 500 mg via ORAL
  Filled 2022-02-10: qty 1

## 2022-02-10 MED ORDER — CEPHALEXIN 500 MG PO CAPS: 500.0000 mg | ORAL_CAPSULE | Freq: Two times a day (BID) | ORAL | 0 refills | Status: DC

## 2022-02-10 NOTE — ED Notes (Signed)
Pt's foley catheter bag was replaced with a leg bag. Discharged w/o incident.

## 2022-02-10 NOTE — Discharge Instructions (Addendum)
Take the antibiotics as discussed.  Make sure that you are drinking fluids to flush your urine.  Return to the emergency room if you have any worsening symptoms or your catheter stops draining.  Otherwise make an appointment to follow-up next week with the urologist.

## 2022-02-10 NOTE — ED Triage Notes (Signed)
Patient has no urinated since yesterday. Having pain where his bladder is. Tried to catherize himself 3 days. Michela Pitcher it did not work, they only got blood.

## 2022-02-10 NOTE — ED Provider Notes (Signed)
[facility] DEPT Provider Note   CSN: [id number] Arrival date & time: 02/10/22  1109     History  Chief Complaint  Patient presents with   Urinary Retention    Glenn Patel is a 88 y.o. male.  Patient is a 88 year old male who presents with inability to urinate.  He does have a history of BPH.  Is followed by [facility].  He states that he has not been able to urinate since yesterday at around 5 PM.  He is having some pain across his lower abdomen.  He has had a little nausea but no vomiting.  He had some loose stools earlier today.  He was not have any urinary symptoms prior to yesterday.  He does have the ability to do self catheterizations at home but he was not able to get the catheter to pass.       Home Medications Prior to Admission medications   Medication Sig Start Date End Date Taking? Authorizing Provider  cephALEXin (KEFLEX) 500 MG capsule Take 1 capsule (500 mg total) by mouth 2 (two) times daily. 02/10/22  Yes Malvin Johns, MD  azithromycin (AZASITE) 1 % ophthalmic solution Place 1 drop into both eyes daily.    [provider]  brimonidine-timolol (COMBIGAN) 0.2-0.5 % ophthalmic solution Combigan 0.2 %-0.5 % eye drops    [provider]  clopidogrel (PLAVIX) 75 MG tablet Take 75 mg by mouth every morning.  Patient not taking: Reported on 01/25/2021    [provider]  cycloSPORINE (RESTASIS) 0.05 % ophthalmic emulsion Restasis 0.05 % eye drops in a dropperette    [provider]  dorzolamide-timolol (COSOPT) 22.3-6.8 MG/ML ophthalmic solution Place 1 drop into both eyes 2 (two) times daily.  Patient not taking: Reported on 01/25/2021 01/24/14   [provider]  finasteride (PROSCAR) 5 MG tablet Take 5 mg by mouth every morning.  Patient not taking: Reported on 01/25/2021    [provider]  isosorbide mononitrate (IMDUR) 60 MG 24 hr tablet Take 60 mg by mouth every morning.   Patient not taking: Reported on 01/25/2021    [provider]  ketorolac (ACULAR) 0.5 % ophthalmic solution INSTILL 1 DROP INTO THE RIGHT EYE FOUR TIMES A DAY 05/24/21   Rankin, Clent Demark, MD  latanoprost (XALATAN) 0.005 % ophthalmic solution Place 1 drop into both eyes 2 (two) times daily.  Patient not taking: Reported on 01/25/2021 02/04/14   [provider]  losartan (COZAAR) 100 MG tablet Take 100 mg by mouth every morning.  Patient not taking: Reported on 01/25/2021    [provider]  oxyCODONE (OXY IR/ROXICODONE) 5 MG immediate release tablet Take 1 tablet (5 mg total) by mouth every 4 (four) hours as needed for moderate pain. Patient not taking: Reported on 01/25/2021 02/24/14   Theodis Blaze, MD  RHOPRESSA 0.02 % SOLN  01/08/21   [provider]  simvastatin (ZOCOR) 40 MG tablet Take 40 mg by mouth every morning.     [provider]  tamsulosin (FLOMAX) 0.4 MG CAPS capsule Take 0.4 mg by mouth every morning.  Patient not taking: Reported on 01/25/2021    [provider]  triamcinolone cream (KENALOG) 0.1 % Apply 1 application topically 2 (two) times daily as needed (psoriasis).  Patient not taking: Reported on 01/25/2021 12/14/13   [provider]      Allergies    Patient has no known allergies.    Review of Systems  Review of Systems  Constitutional:  Negative for chills, diaphoresis, fatigue and fever.  HENT:  Negative for congestion, rhinorrhea and sneezing.   Eyes: Negative.   Respiratory:  Negative for cough, chest tightness and shortness of breath.   Cardiovascular:  Negative for chest pain and leg swelling.  Gastrointestinal:  Positive for nausea. Negative for abdominal pain, blood in stool, diarrhea (2 or 3 loose stools today) and vomiting.  Genitourinary:  Positive for difficulty urinating. Negative for flank pain, frequency and hematuria.  Musculoskeletal:  Negative for arthralgias and back pain.  Skin:   Negative for rash.  Neurological:  Negative for dizziness, speech difficulty, weakness, numbness and headaches.    Physical Exam Updated Vital Signs BP (!) 146/82 (BP Location: Left Arm)   Pulse 73   Temp 98 F (36.7 C) (Oral)   Resp 16   Ht 6' (1.829 m)   Wt 95.3 kg   SpO2 99%   BMI 28.48 kg/m  Physical Exam Constitutional:      Appearance: He is well-developed.  HENT:     Head: Normocephalic and atraumatic.  Eyes:     Pupils: Pupils are equal, round, and reactive to light.  Cardiovascular:     Rate and Rhythm: Normal rate and regular rhythm.     Heart sounds: Normal heart sounds.  Pulmonary:     Effort: Pulmonary effort is normal. No respiratory distress.     Breath sounds: Normal breath sounds. No wheezing or rales.  Chest:     Chest wall: No tenderness.  Abdominal:     General: Bowel sounds are normal.     Palpations: Abdomen is soft.     Tenderness: There is abdominal tenderness (Tenderness across the lower abdomen). There is no guarding or rebound.  Musculoskeletal:        General: Normal range of motion.     Cervical back: Normal range of motion and neck supple.  Lymphadenopathy:     Cervical: No cervical adenopathy.  Skin:    General: Skin is warm and dry.     Findings: No rash.  Neurological:     Mental Status: He is alert and oriented to person, place, and time.     ED Results / Procedures / Treatments   Labs (all labs ordered are listed, but only abnormal results are displayed) Labs Reviewed  URINALYSIS, ROUTINE W REFLEX MICROSCOPIC - Abnormal; Notable for the following components:      Result Value   Color, Urine RED (*)    APPearance TURBID (*)    Hgb urine dipstick LARGE (*)    Bilirubin Urine SMALL (*)    Ketones, ur 15 (*)    Protein, ur >300 (*)    Nitrite POSITIVE (*)    Leukocytes,Ua SMALL (*)    All other components within normal limits  BASIC METABOLIC PANEL - Abnormal; Notable for the following components:   CO2 20 (*)    All  other components within normal limits  CBC - Abnormal; Notable for the following components:   WBC 13.7 (*)    RDW 15.6 (*)    All other components within normal limits  URINALYSIS, MICROSCOPIC (REFLEX) - Abnormal; Notable for the following components:   Bacteria, UA FEW (*)    All other components within normal limits  URINE CULTURE    EKG None  Radiology No results found.  Procedures Procedures    Medications Ordered in ED Medications  cephALEXin (KEFLEX) capsule 500 mg (has no administration in time range)  ED Course/ Medical Decision Making/ A&P                           Medical Decision Making Amount and/or Complexity of Data Reviewed Labs: ordered.  Risk Prescription drug management.   Patient is a 88 year old male who presents with urinary retention.  Her nursing staff was unable to place a catheter at bedside.  Dr. Gloriann Loan with urology was consulted.  He was able to place a catheter with a guidewire.  He had gross hematuria following this but he is not passing significant clots.  His urine was checked and does appear to be infected.  It was sent for culture.  Will be started on Keflex.  His other labs are reviewed.  His creatinine is normal.  His white count is mildly elevated.  However he is not febrile.  No tachycardia or other suggestions of sepsis.  He is otherwise well-appearing and ready to go home.  His catheter seems to be draining well.  He was discharged home in good condition.  Discussed starting the antibiotics.  Discussed following up with [facility] next week.  Return precautions were given.  Final Clinical Impression(s) / ED Diagnoses Final diagnoses:  Urinary retention  Gross hematuria  Acute cystitis with hematuria    Rx / DC Orders ED Discharge Orders          Ordered    cephALEXin (KEFLEX) 500 MG capsule  2 times daily        02/10/22 1724              Malvin Johns, MD 02/10/22 1726

## 2022-02-10 NOTE — Consult Note (Signed)
H&P Physician requesting consult: Glenn Patel  Chief Complaint: Urinary retention, difficult Foley  History of Present Illness: 88 year old male with a history of BPH.  Has intermittent episodes of urinary retention.  Occasionally has to catheterize.  He has had retention and try to catheterize but met resistance and only blood return.  Therefore, presented to the emergency department.  Nursing unable to place catheter.  Past Medical History:  Diagnosis Date   Hyperlipidemia    Hypertension    Urinary retention due to benign prostatic hyperplasia    History reviewed. No pertinent surgical history.  Home Medications:  (Not in a hospital admission)  Allergies: No Known Allergies  History reviewed. No pertinent family history. Social History:  reports that he has been smoking cigarettes. He has never used smokeless tobacco. He reports that he does not drink alcohol and does not use drugs.  ROS: A complete review of systems was performed.  All systems are negative except for pertinent findings as noted. ROS   Physical Exam:  Vital signs in last 24 hours: Temp:  [98 F (36.7 C)] 98 F (36.7 C) (11/11 1127) Pulse Rate:  [72] 72 (11/11 1127) Resp:  [18] 18 (11/11 1127) BP: (150)/(86) 150/86 (11/11 1127) SpO2:  [98 %] 98 % (11/11 1127) Weight:  [95.3 kg] 95.3 kg (11/11 1210) General:  Alert and oriented, No acute distress HEENT: Normocephalic, atraumatic Neck: No JVD or lymphadenopathy Cardiovascular: Regular rate and rhythm Lungs: Regular rate and effort Abdomen: Soft, nontender, nondistended, no abdominal masses Back: No CVA tenderness Extremities: No edema Genitourinary: Circumcised phallus.  Bilateral testicles descended without masses. Neurologic: Grossly intact  Laboratory Data:  No results found for this or any previous visit (from the past 24 hour(s)). No results found for this or any previous visit (from the past 240 hour(s)). Creatinine: No results for  input(s): "CREATININE" in the last 168 hours.   Procedure: Under sterile conditions, I instilled lidocaine jelly into the urethra.  I tried to pass an 23 [place] coud catheter but this met resistance around the area of the prostate.  I tried to advance a sensor wire but this met resistance as well.  A Glidewire was advanced into the urethra and this passed into the bladder.  I was then able to pass a 16 [place] council tip catheter over the wire and into the bladder.  10 cc sterile water was instilled to the catheter balloon.  Wire was withdrawn and catheter secured to the drainage bag.  I secured the catheter to his left leg.  Impression/Assessment:  Difficult Foley catheter placement Retention  Plan:  Maintain Foley catheter for at least 1 week.  Follow-up for voiding trial in 1 to 2 weeks.  Glenn Patel, III 02/10/2022, 3:13 PM

## 2022-02-10 NOTE — ED Provider Triage Note (Signed)
Emergency Medicine Provider Triage Evaluation Note  Glenn Patel , a 88 y.o. male  was evaluated in triage.  Pt complains of urinary retention.  Patient states he has not been able to urinate since yesterday at approximately 5 PM.  He states that he has attempted to urinate multiple times including in and out caths with only a small lot of blood returned.  He denies abdominal pain, back pain, fever, nausea, vomiting.  Patient does have a history of urinary retention due to BPH  Review of Systems  Positive: As above Negative: As above  Physical Exam  BP (!) 150/86 (BP Location: Right Arm)   Pulse 72   Temp 98 F (36.7 C) (Oral)   Resp 18   Ht 6' (1.829 m)   Wt 95.3 kg   SpO2 98%   BMI 28.48 kg/m  Gen:   Awake, no distress   Resp:  Normal effort  MSK:   Moves extremities without difficulty  Other:    Medical Decision Making  Medically screening exam initiated at 12:25 PM.  Appropriate orders placed.  Glenn Patel was informed that the remainder of the evaluation will be completed by another provider, this initial triage assessment does not replace that evaluation, and the importance of remaining in the ED until their evaluation is complete.     Glenn Peng, PA-C 02/10/22 1229

## 2022-02-12 LAB — URINE CULTURE: Culture: NO GROWTH

## 2022-02-19 DIAGNOSIS — R338 Other retention of urine: Secondary | ICD-10-CM | POA: Diagnosis not present

## 2022-02-20 ENCOUNTER — Telehealth: Payer: Self-pay

## 2022-02-20 NOTE — Telephone Encounter (Signed)
     Patient  visit on 02/10/2022  at [facility] was for Retention of urine, unspecified.  Have you been able to follow up with your primary care physician? Yes  The patient was or was not able to obtain any needed medicine or equipment. Patient was able to obtain medication.  Are there diet recommendations that you are having difficulty following? No  Patient expresses understanding of discharge instructions and education provided has no other needs at this time.    [facility] Resource Care Guide   [email or web address]  ?? [phone]   Website: [email or web address]  [email or web address]

## 2022-02-26 DIAGNOSIS — H26492 Other secondary cataract, left eye: Secondary | ICD-10-CM | POA: Diagnosis not present

## 2022-02-26 DIAGNOSIS — H0102B Squamous blepharitis left eye, upper and lower eyelids: Secondary | ICD-10-CM | POA: Diagnosis not present

## 2022-02-26 DIAGNOSIS — H35351 Cystoid macular degeneration, right eye: Secondary | ICD-10-CM | POA: Diagnosis not present

## 2022-02-26 DIAGNOSIS — H10502 Unspecified blepharoconjunctivitis, left eye: Secondary | ICD-10-CM | POA: Diagnosis not present

## 2022-02-26 DIAGNOSIS — Z961 Presence of intraocular lens: Secondary | ICD-10-CM | POA: Diagnosis not present

## 2022-02-26 DIAGNOSIS — H401133 Primary open-angle glaucoma, bilateral, severe stage: Secondary | ICD-10-CM | POA: Diagnosis not present

## 2022-02-26 DIAGNOSIS — H0102A Squamous blepharitis right eye, upper and lower eyelids: Secondary | ICD-10-CM | POA: Diagnosis not present

## 2022-02-28 ENCOUNTER — Emergency Department (HOSPITAL_COMMUNITY)
Admission: EM | Admit: 2022-02-28 | Discharge: 2022-03-01 | Disposition: A | Discharge status: Home / Self Care | Payer: PPO | Attending: Emergency Medicine | Admitting: Emergency Medicine

## 2022-02-28 DIAGNOSIS — I10 Essential (primary) hypertension: Secondary | ICD-10-CM | POA: Diagnosis not present

## 2022-02-28 DIAGNOSIS — Z7902 Long term (current) use of antithrombotics/antiplatelets: Secondary | ICD-10-CM | POA: Insufficient documentation

## 2022-02-28 DIAGNOSIS — R338 Other retention of urine: Secondary | ICD-10-CM | POA: Diagnosis not present

## 2022-02-28 DIAGNOSIS — F17210 Nicotine dependence, cigarettes, uncomplicated: Secondary | ICD-10-CM | POA: Insufficient documentation

## 2022-02-28 DIAGNOSIS — N3000 Acute cystitis without hematuria: Secondary | ICD-10-CM | POA: Diagnosis not present

## 2022-02-28 DIAGNOSIS — Z79899 Other long term (current) drug therapy: Secondary | ICD-10-CM | POA: Insufficient documentation

## 2022-02-28 DIAGNOSIS — N401 Enlarged prostate with lower urinary tract symptoms: Secondary | ICD-10-CM | POA: Diagnosis not present

## 2022-02-28 DIAGNOSIS — R339 Retention of urine, unspecified: Secondary | ICD-10-CM | POA: Insufficient documentation

## 2022-02-28 DIAGNOSIS — R3915 Urgency of urination: Secondary | ICD-10-CM | POA: Diagnosis not present

## 2022-03-01 ENCOUNTER — Other Ambulatory Visit: Payer: Self-pay

## 2022-03-01 ENCOUNTER — Encounter (HOSPITAL_COMMUNITY): Payer: Self-pay

## 2022-03-01 DIAGNOSIS — R3915 Urgency of urination: Secondary | ICD-10-CM | POA: Diagnosis not present

## 2022-03-01 DIAGNOSIS — N401 Enlarged prostate with lower urinary tract symptoms: Secondary | ICD-10-CM | POA: Diagnosis not present

## 2022-03-01 DIAGNOSIS — R338 Other retention of urine: Secondary | ICD-10-CM | POA: Diagnosis not present

## 2022-03-01 MED ORDER — OXYCODONE-ACETAMINOPHEN 5-325 MG PO TABS
1.0000 | ORAL_TABLET | Freq: Once | ORAL | Status: AC
  Administered 2022-03-01: 1 via ORAL
  Filled 2022-03-01: qty 1

## 2022-03-01 NOTE — ED Notes (Signed)
Urology cart at bedside 

## 2022-03-01 NOTE — Consult Note (Signed)
Subjective: 1. Urinary retention      Consult requested by Dr. Thayer Patel.  Glenn Patel is an 88 yo male with BPH and retention who was seen in our office on 11/29 for a voiding trial after having a foley placed over a wire by Dr. Gloriann Patel in the ER on 11/1.  He was able to void initially but had progressive difficulty and returned to the OR where foley attempts by the staff were unsuccessful.  ROS:  Review of Systems  Gastrointestinal:  Positive for abdominal pain.  Genitourinary:  Positive for urgency.    No Known Allergies  Past Medical History:  Diagnosis Date   Hyperlipidemia    Hypertension    Urinary retention due to benign prostatic hyperplasia     History reviewed. No pertinent surgical history.  Social History   Socioeconomic History   Marital status: Married    Spouse name: Not on file   Number of children: Not on file   Years of education: Not on file   Highest education level: Not on file  Occupational History   Not on file  Tobacco Use   Smoking status: Some Days    Types: Cigarettes   Smokeless tobacco: Never  Vaping Use   Vaping Use: Never used  Substance and Sexual Activity   Alcohol use: No   Drug use: No   Sexual activity: Not on file  Other Topics Concern   Not on file  Social History Narrative   Not on file   Social Determinants of Health   Financial Resource Strain: Not on file  Food Insecurity: Not on file  Transportation Needs: Not on file  Physical Activity: Not on file  Stress: Not on file  Social Connections: Not on file  Intimate Partner Violence: Not on file    History reviewed. No pertinent family history.  Anti-infectives: Anti-infectives (From admission, onward)    None       No current facility-administered medications for this encounter.   Current Outpatient Medications  Medication Sig Dispense Refill   azithromycin (AZASITE) 1 % ophthalmic solution Place 1 drop into both eyes daily.     brimonidine-timolol  (COMBIGAN) 0.2-0.5 % ophthalmic solution Combigan 0.2 %-0.5 % eye drops     cephALEXin (KEFLEX) 500 MG capsule Take 1 capsule (500 mg total) by mouth 2 (two) times daily. 14 capsule 0   clopidogrel (PLAVIX) 75 MG tablet Take 75 mg by mouth every morning.  (Patient not taking: Reported on 01/25/2021)     cycloSPORINE (RESTASIS) 0.05 % ophthalmic emulsion Restasis 0.05 % eye drops in a dropperette     dorzolamide-timolol (COSOPT) 22.3-6.8 MG/ML ophthalmic solution Place 1 drop into both eyes 2 (two) times daily.  (Patient not taking: Reported on 01/25/2021)  1   finasteride (PROSCAR) 5 MG tablet Take 5 mg by mouth every morning.  (Patient not taking: Reported on 01/25/2021)     isosorbide mononitrate (IMDUR) 60 MG 24 hr tablet Take 60 mg by mouth every morning.  (Patient not taking: Reported on 01/25/2021)     ketorolac (ACULAR) 0.5 % ophthalmic solution INSTILL 1 DROP INTO THE RIGHT EYE FOUR TIMES A DAY 5 mL 5   latanoprost (XALATAN) 0.005 % ophthalmic solution Place 1 drop into both eyes 2 (two) times daily.  (Patient not taking: Reported on 01/25/2021)  1   losartan (COZAAR) 100 MG tablet Take 100 mg by mouth every morning.  (Patient not taking: Reported on 01/25/2021)     oxyCODONE (OXY IR/ROXICODONE) 5  MG immediate release tablet Take 1 tablet (5 mg total) by mouth every 4 (four) hours as needed for moderate pain. (Patient not taking: Reported on 01/25/2021) 30 tablet 0   RHOPRESSA 0.02 % SOLN      simvastatin (ZOCOR) 40 MG tablet Take 40 mg by mouth every morning.      tamsulosin (FLOMAX) 0.4 MG CAPS capsule Take 0.4 mg by mouth every morning.  (Patient not taking: Reported on 01/25/2021)     triamcinolone cream (KENALOG) 0.1 % Apply 1 application topically 2 (two) times daily as needed (psoriasis).  (Patient not taking: Reported on 01/25/2021)  0     Objective: Vital signs in last 24 hours: BP (!) 138/91 (BP Location: Left Arm)   Pulse 74   Temp 98 F (36.7 C) (Oral)   Resp 16   Ht 6'  (1.829 m)   Wt 95.3 kg   SpO2 98%   BMI 28.48 kg/m   Intake/Output from previous day: No intake/output data recorded. Intake/Output this shift: No intake/output data recorded.   Physical Exam Vitals reviewed.  Constitutional:      Appearance: Normal appearance.  Abdominal:     Tenderness: There is abdominal tenderness (suprapubic).  Genitourinary:    Comments: Circumcised with mild glanular erythema but a normal meatus.  Neurological:     Mental Status: He is alert.     Lab Results:  No results found for this or any previous visit (from the past 24 hour(s)).  BMET No results for input(s): "NA", "K", "CL", "CO2", "GLUCOSE", "BUN", "CREATININE", "CALCIUM" in the last 72 hours. PT/INR No results for input(s): "LABPROT", "INR" in the last 72 hours. ABG No results for input(s): "PHART", "HCO3" in the last 72 hours.  Invalid input(s): "PCO2", "PO2"  Studies/Results: No results found.  Procedure: Complicated foley placement.            The penis was prepped and he was draped with sterile towels.  The urethra was instilled with 2% lidocaine jelly.  An angled tip glide wire was inserted per urethra and advanced without resistance to the bladder.   A 16 fr council catheter was advanced over the wire with mild resistance into the bladder with urine return.  The balloon was filled with 19m and the wire was removed.   The foley was placed to a drainage bag.   Assessment/Plan: BPH with retention:  A 16 foley was placed over a wire.   He will need to keep his f/u in 10 days with Glenn Patel.         No follow-ups on file.    CC: Dr. CThayer Jewand Dr. ELink Patel      Glenn Seal11/30/2023

## 2022-03-01 NOTE — ED Provider Notes (Addendum)
[facility] DEPT Provider Note   CSN: [id number] Arrival date & time: 02/28/22  2341     History  Chief Complaint  Patient presents with   Urinary Retention    Glenn Patel is a 88 y.o. male.  HPI     This is an 88 year old male who presents with urinary retention.  Patient reports that he had his Foley catheter removed this morning at the urology office.  He had previously had it in for the last 2 weeks.  He states that he has not been able to effectively urinate since 4 PM.  He has had some dribbling but otherwise no effective urine output.  He reports abdominal pressure and discomfort.  Denies flank pain or fevers.  Home Medications Prior to Admission medications   Medication Sig Start Date End Date Taking? Authorizing Provider  azithromycin (AZASITE) 1 % ophthalmic solution Place 1 drop into both eyes daily.    [provider]  brimonidine-timolol (COMBIGAN) 0.2-0.5 % ophthalmic solution Combigan 0.2 %-0.5 % eye drops    [provider]  cephALEXin (KEFLEX) 500 MG capsule Take 1 capsule (500 mg total) by mouth 2 (two) times daily. 02/10/22   Malvin Johns, MD  clopidogrel (PLAVIX) 75 MG tablet Take 75 mg by mouth every morning.  Patient not taking: Reported on 01/25/2021    [provider]  cycloSPORINE (RESTASIS) 0.05 % ophthalmic emulsion Restasis 0.05 % eye drops in a dropperette    [provider]  dorzolamide-timolol (COSOPT) 22.3-6.8 MG/ML ophthalmic solution Place 1 drop into both eyes 2 (two) times daily.  Patient not taking: Reported on 01/25/2021 01/24/14   [provider]  finasteride (PROSCAR) 5 MG tablet Take 5 mg by mouth every morning.  Patient not taking: Reported on 01/25/2021    [provider]  isosorbide mononitrate (IMDUR) 60 MG 24 hr tablet Take 60 mg by mouth every morning.  Patient not taking: Reported on 01/25/2021    [provider]  ketorolac (ACULAR)  0.5 % ophthalmic solution INSTILL 1 DROP INTO THE RIGHT EYE FOUR TIMES A DAY 05/24/21   Rankin, Clent Demark, MD  latanoprost (XALATAN) 0.005 % ophthalmic solution Place 1 drop into both eyes 2 (two) times daily.  Patient not taking: Reported on 01/25/2021 02/04/14   [provider]  losartan (COZAAR) 100 MG tablet Take 100 mg by mouth every morning.  Patient not taking: Reported on 01/25/2021    [provider]  oxyCODONE (OXY IR/ROXICODONE) 5 MG immediate release tablet Take 1 tablet (5 mg total) by mouth every 4 (four) hours as needed for moderate pain. Patient not taking: Reported on 01/25/2021 02/24/14   Theodis Blaze, MD  RHOPRESSA 0.02 % SOLN  01/08/21   [provider]  simvastatin (ZOCOR) 40 MG tablet Take 40 mg by mouth every morning.     [provider]  tamsulosin (FLOMAX) 0.4 MG CAPS capsule Take 0.4 mg by mouth every morning.  Patient not taking: Reported on 01/25/2021    [provider]  triamcinolone cream (KENALOG) 0.1 % Apply 1 application topically 2 (two) times daily as needed (psoriasis).  Patient not taking: Reported on 01/25/2021 12/14/13   [provider]      Allergies    Patient has no known allergies.    Review of Systems   Review of Systems  Constitutional:  Negative for fever.  Genitourinary:  Positive for difficulty urinating. Negative for dysuria and flank pain.  All other systems  reviewed and are negative.   Physical Exam Updated Vital Signs BP (!) 138/91 (BP Location: Left Arm)   Pulse 74   Temp 98 F (36.7 C) (Oral)   Resp 16   Ht 1.829 m (6')   Wt 95.3 kg   SpO2 98%   BMI 28.48 kg/m  Physical Exam Vitals and nursing note reviewed.  Constitutional:      Appearance: He is well-developed.     Comments: Elderly, nontoxic-appearing, no acute distress  HENT:     Head: Normocephalic and atraumatic.  Eyes:     Pupils: Pupils are equal, round, and reactive to light.  Cardiovascular:     Rate and  Rhythm: Normal rate and regular rhythm.     Heart sounds: Normal heart sounds. No murmur heard. Pulmonary:     Effort: Pulmonary effort is normal. No respiratory distress.     Breath sounds: Normal breath sounds. No wheezing.  Abdominal:     General: Bowel sounds are normal.     Palpations: Abdomen is soft.     Tenderness: There is abdominal tenderness. There is no rebound.     Comments: Suprapubic tenderness to palpation  Musculoskeletal:     Cervical back: Neck supple.  Lymphadenopathy:     Cervical: No cervical adenopathy.  Skin:    General: Skin is warm and dry.  Neurological:     Mental Status: He is alert and oriented to person, place, and time.  Psychiatric:        Mood and Affect: Mood normal.     ED Results / Procedures / Treatments   Labs (all labs ordered are listed, but only abnormal results are displayed) Labs Reviewed - No data to display  EKG None  Radiology No results found.  Procedures Procedures    Medications Ordered in ED Medications  oxyCODONE-acetaminophen (PERCOCET/ROXICET) 5-325 MG per tablet 1 tablet (1 tablet Oral Given 03/01/22 0228)    ED Course/ Medical Decision Making/ A&P Clinical Course as of 03/01/22 0303  Thu Mar 01, 2022  0142 Attempted Foley catheter insertion with a 20 [place] coud.  Met significant resistance.  Urology consulted. [CH]    Clinical Course User Index [CH] Santo Zahradnik, Barbette Hair, MD                           Medical Decision Making Risk Prescription drug management.   This patient presents to the ED for concern of urinary retention, this involves an extensive number of treatment options, and is a complaint that carries with it a high risk of complications and morbidity.  I considered the following differential and admission for this acute, potentially life threatening condition.  The differential diagnosis includes recurrent retention, bladder outlet obstruction, BPH  MDM:    This is an 88 year old male who  presents with recurrent urinary retention.  Just had his Foley catheter removed less than 24 hours ago.  He is overall nontoxic and vital signs are notable for blood pressure of 138/91.  Patient is uncomfortable appearing but nontoxic.  Attempted Foley placement but was unsuccessful.  Dr. Jeffie Pollock was consulted.  Foley catheter in place.  Good urine output.  We will have him follow-up with urology.  Do not feel urine studies are indicated at this time.  (Labs, imaging, consults)  Labs: I Ordered, and personally interpreted labs.  The pertinent results include:  noe  Imaging Studies ordered: I ordered imaging studies including none I independently visualized and interpreted imaging.  I agree with the radiologist interpretation  Additional history obtained from chart review.  External records from outside source obtained and reviewed including recent evaluations  Cardiac Monitoring: The patient was maintained on a cardiac monitor.  I personally viewed and interpreted the cardiac monitored which showed an underlying rhythm of: sinus rhythm  Reevaluation: After the interventions noted above, I reevaluated the patient and found that they have :improved  Social Determinants of Health: lives independently   Disposition:  d/c  Co morbidities that complicate the patient evaluation  Past Medical History:  Diagnosis Date   Hyperlipidemia    Hypertension    Urinary retention due to benign prostatic hyperplasia      Medicines Meds ordered this encounter  Medications   oxyCODONE-acetaminophen (PERCOCET/ROXICET) 5-325 MG per tablet 1 tablet    I have reviewed the patients home medicines and have made adjustments as needed  Problem List / ED Course: Problem List Items Addressed This Visit       Genitourinary   Urinary retention - Primary                Final Clinical Impression(s) / ED Diagnoses Final diagnoses:  Urinary retention    Rx / DC Orders ED Discharge Orders      None         Aleksa Catterton, Barbette Hair, MD 03/01/22 0041    Merryl Hacker, MD 03/01/22 [phone]

## 2022-03-01 NOTE — ED Notes (Addendum)
EDP attempted 44f coude cath unsuccessfully.

## 2022-03-01 NOTE — Discharge Instructions (Signed)
You were seen today for urinary retention.  He had a Foley catheter placed.  Follow-up with urology as instructed.

## 2022-03-01 NOTE — ED Triage Notes (Signed)
Pt had his foley catheter removed today after having it for 2 weeks and has been unable to urinate since 4 pm.

## 2022-03-14 DIAGNOSIS — N3000 Acute cystitis without hematuria: Secondary | ICD-10-CM | POA: Diagnosis not present

## 2022-03-14 DIAGNOSIS — R338 Other retention of urine: Secondary | ICD-10-CM | POA: Diagnosis not present

## 2022-03-21 DIAGNOSIS — R338 Other retention of urine: Secondary | ICD-10-CM | POA: Diagnosis not present

## 2022-03-28 DIAGNOSIS — N401 Enlarged prostate with lower urinary tract symptoms: Secondary | ICD-10-CM | POA: Diagnosis not present

## 2022-03-28 DIAGNOSIS — N311 Reflex neuropathic bladder, not elsewhere classified: Secondary | ICD-10-CM | POA: Diagnosis not present

## 2022-03-28 DIAGNOSIS — R338 Other retention of urine: Secondary | ICD-10-CM | POA: Diagnosis not present

## 2022-04-03 DIAGNOSIS — R338 Other retention of urine: Secondary | ICD-10-CM | POA: Diagnosis not present

## 2022-04-03 DIAGNOSIS — N401 Enlarged prostate with lower urinary tract symptoms: Secondary | ICD-10-CM | POA: Diagnosis not present

## 2022-04-04 DIAGNOSIS — R338 Other retention of urine: Secondary | ICD-10-CM | POA: Diagnosis not present

## 2022-04-04 DIAGNOSIS — N311 Reflex neuropathic bladder, not elsewhere classified: Secondary | ICD-10-CM | POA: Diagnosis not present

## 2022-04-04 DIAGNOSIS — N401 Enlarged prostate with lower urinary tract symptoms: Secondary | ICD-10-CM | POA: Diagnosis not present

## 2022-04-11 DIAGNOSIS — R338 Other retention of urine: Secondary | ICD-10-CM | POA: Diagnosis not present

## 2022-04-19 DIAGNOSIS — R338 Other retention of urine: Secondary | ICD-10-CM | POA: Diagnosis not present

## 2022-04-23 DIAGNOSIS — R338 Other retention of urine: Secondary | ICD-10-CM | POA: Diagnosis not present

## 2022-04-23 DIAGNOSIS — B3742 Candidal balanitis: Secondary | ICD-10-CM | POA: Diagnosis not present

## 2022-04-25 DIAGNOSIS — G4733 Obstructive sleep apnea (adult) (pediatric): Secondary | ICD-10-CM | POA: Diagnosis not present

## 2022-04-25 DIAGNOSIS — R310 Gross hematuria: Secondary | ICD-10-CM | POA: Diagnosis not present

## 2022-05-03 ENCOUNTER — Other Ambulatory Visit: Payer: Self-pay | Admitting: Urology

## 2022-05-04 NOTE — Progress Notes (Signed)
Sent message, via epic in basket, requesting orders in epic from surgeon.  

## 2022-05-06 NOTE — Patient Instructions (Signed)
SURGICAL WAITING ROOM VISITATION Patients having surgery or a procedure may have no more than 2 support people in the waiting area - these visitors may rotate in the visitor waiting room.   Due to an increase in RSV and influenza rates and associated hospitalizations, children ages 34 and under may not visit patients in [facility]. If the patient needs to stay at the hospital during part of their recovery, the visitor guidelines for inpatient rooms apply.  PRE-OP VISITATION  Pre-op nurse will coordinate an appropriate time for 1 support person to accompany the patient in pre-op.  This support person may not rotate.  This visitor will be contacted when the time is appropriate for the visitor to come back in the pre-op area.  Please refer to the [facility] website for the visitor guidelines for Inpatients (after your surgery is over and you are in a regular room).  You are not required to quarantine at this time prior to your surgery. However, you must do this: Hand Hygiene often Do NOT share personal items Notify your provider if you are in close contact with someone who has COVID or you develop fever 100.4 or greater, new onset of sneezing, cough, sore throat, shortness of breath or body aches.  If you test positive for Covid or have been in contact with anyone that has tested positive in the last 10 days please notify you surgeon.    Your procedure is scheduled on:  Monday  May 21, 2022  Report to [facility] Main Entrance: [place] entrance where the [employer] is available.   Report to admitting at: 06:30  AM  +++++Call this number if you have any questions or problems the morning of surgery [phone]  DO NOT EAT OR DRINK ANYTHING AFTER MIDNIGHT THE NIGHT PRIOR TO YOUR SURGERY / PROCEDURE.   FOLLOW BOWEL PREP AND ANY ADDITIONAL PRE OP INSTRUCTIONS YOU RECEIVED FROM YOUR SURGEON'S OFFICE!!!  Use 1 Fleet Enema according to your Doctor's instruction.     Oral Hygiene is also important to reduce your risk of infection.        Remember - BRUSH YOUR TEETH THE MORNING OF SURGERY WITH YOUR REGULAR TOOTHPASTE  Do NOT smoke after Midnight the night before surgery.  Take ONLY these medicines the morning of surgery with A SIP OF WATER: Finasteride (Proscar). You may use your usual morning Eye drops.    You may not have any metal on your body including  jewelry, and body piercing  Do not wear  lotions, powders, cologne, or deodorant  Men may shave face and neck.  Contacts, Hearing Aids, dentures or bridgework may not be worn into surgery. DENTURES WILL BE REMOVED PRIOR TO SURGERY PLEASE DO NOT APPLY "Poly grip" OR ADHESIVES!!!  You may bring a small overnight bag with you on the day of surgery, only pack items that are not valuable. [facility] IS NOT RESPONSIBLE   FOR VALUABLES THAT ARE LOST OR STOLEN.   Do not bring your home medications to the hospital. The Pharmacy will dispense medications listed on your medication list to you during your admission in the Hospital.  Special Instructions: Bring a copy of your healthcare power of attorney and living will documents the day of surgery, if you wish to have them scanned into your [facility] Medical Records- EPIC  Please read over the following fact sheets you were given: IF YOU HAVE QUESTIONS ABOUT YOUR PRE-OP INSTRUCTIONS, [place] [phone]  Zigmund Daniel)   [facility] -  Preparing for Surgery Before surgery, you can play an important role.  Because skin is not sterile, your skin needs to be as free of germs as possible.  You can reduce the number of germs on your skin by washing with CHG (chlorahexidine gluconate) soap before surgery.  CHG is an antiseptic cleaner which kills germs and bonds with the skin to continue killing germs even after washing. Please DO NOT use if you have an allergy to CHG or antibacterial soaps.  If your skin becomes reddened/irritated stop using the CHG and inform your  nurse when you arrive at Short Stay. Do not shave (including legs and underarms) for at least 48 hours prior to the first CHG shower.  You may shave your face/neck.  Please follow these instructions carefully:  1.  Shower with CHG Soap the night before surgery and the  morning of surgery.  2.  If you choose to wash your hair, wash your hair first as usual with your normal  shampoo.  3.  After you shampoo, rinse your hair and body thoroughly to remove the shampoo.                             4.  Use CHG as you would any other liquid soap.  You can apply chg directly to the skin and wash.  Gently with a scrungie or clean washcloth.  5.  Apply the CHG Soap to your body ONLY FROM THE NECK DOWN.   Do not use on face/ open                           Wound or open sores. Avoid contact with eyes, ears mouth and genitals (private parts).                       Wash face,  Genitals (private parts) with your normal soap.             6.  Wash thoroughly, paying special attention to the area where your  surgery  will be performed.  7.  Thoroughly rinse your body with warm water from the neck down.  8.  DO NOT shower/wash with your normal soap after using and rinsing off the CHG Soap.            9.  Pat yourself dry with a clean towel.            10.  Wear clean pajamas.            11.  Place clean sheets on your bed the night of your first shower and do not  sleep with pets.  ON THE DAY OF SURGERY : Do not apply any lotions/deodorants the morning of surgery.  Please wear clean clothes to the hospital/surgery center.    FAILURE TO FOLLOW THESE INSTRUCTIONS MAY RESULT IN THE CANCELLATION OF YOUR SURGERY  PATIENT SIGNATURE_________________________________  NURSE SIGNATURE__________________________________  ________________________________________________________________________

## 2022-05-06 NOTE — Progress Notes (Signed)
COVID Vaccine received:  '[]'$  No '[x]'$  Yes Date of any COVID positive Test in last 90 days:  None  PCP - Dibas Garnet Koyanagi, MD    [facility] [place] Cardiologist - Quay Burow MD (01-25-2021 for SOB)  Chest x-ray - 10-20-2020 1v  02-05-2018 2v epic EKG -  01-25-2021  epic Stress Test -  ECHO -  Cardiac Cath -   PCR screen: '[]'$  Ordered & Completed                      '[]'$   No Order but Needs PROFEND                      '[x]'$   N/A for this surgery  Surgery Plan:  '[]'$  Ambulatory                            '[x]'$  Outpatient in bed                            '[]'$  Admit  Anesthesia:    '[x]'$  General  '[]'$  Spinal                           '[]'$   Choice '[]'$   MAC  Bowel Prep - '[]'$  No  '[x]'$   Yes __Use Fleet Enema___________  Pacemaker / ICD device '[x]'$  No '[]'$  Yes        Device order form faxed '[x]'$  No    '[]'$   Yes      Faxed to:  Spinal Cord Stimulator:'[x]'$  No '[]'$  Yes      (Remind patient to bring remote DOS) Other Implants:   History of Sleep Apnea? '[]'$  No '[x]'$  Yes   CPAP used?- '[]'$  No '[x]'$  Yes  CPAP request form sent   Does the patient monitor blood sugar? '[]'$  No '[]'$  Yes  '[x]'$  N/A    No DM  Blood Thinner / Instructions:  none Aspirin Instructions:   none  ERAS Protocol Ordered: '[x]'$  No  '[]'$  Yes  Patient is to be NPO after: Midnight Prior  Comments: Patient is [facility] but doesn't wear any HAs.   Activity level: Patient can not climb a flight of stairs without difficulty; '[x]'$  No CP  but would have SOB.  Patient can perform ADLs without assistance.   Anesthesia review: Smokes some days, HTN, leukocytosis, BPH, OSA-CPAP  Patient denies shortness of breath, fever, cough and chest pain at PAT appointment.  Patient verbalized understanding and agreement to the Pre-Surgical Instructions that were given to them at this PAT appointment. Patient was also educated of the need to review these PAT instructions again prior to his/her surgery.I reviewed the appropriate phone numbers to call if they have any and  questions or concerns.

## 2022-05-08 ENCOUNTER — Encounter (HOSPITAL_COMMUNITY)
Admission: RE | Admit: 2022-05-08 | Discharge: 2022-05-08 | Disposition: A | Discharge status: Home / Self Care | Payer: PPO | Source: Ambulatory Visit | Attending: Urology | Admitting: Urology

## 2022-05-08 ENCOUNTER — Encounter (HOSPITAL_COMMUNITY): Payer: Self-pay

## 2022-05-08 ENCOUNTER — Other Ambulatory Visit: Payer: Self-pay

## 2022-05-08 VITALS — BP 110/68 | HR 69 | Temp 98.0°F | Resp 20 | Ht 72.0 in | Wt 220.0 lb

## 2022-05-08 DIAGNOSIS — I10 Essential (primary) hypertension: Secondary | ICD-10-CM | POA: Diagnosis not present

## 2022-05-08 DIAGNOSIS — Z01818 Encounter for other preprocedural examination: Secondary | ICD-10-CM | POA: Diagnosis not present

## 2022-05-08 HISTORY — DX: Personal history of urinary calculi: Z87.442

## 2022-05-08 HISTORY — DX: Malignant (primary) neoplasm, unspecified: C80.1

## 2022-05-08 HISTORY — DX: Chronic kidney disease, unspecified: N18.9

## 2022-05-08 HISTORY — DX: Unspecified osteoarthritis, unspecified site: M19.90

## 2022-05-08 LAB — BASIC METABOLIC PANEL
Anion gap: 7 (ref 5–15)
BUN: 13 mg/dL (ref 8–23)
CO2: 23 mmol/L (ref 22–32)
Calcium: 9.4 mg/dL (ref 8.9–10.3)
Chloride: 107 mmol/L (ref 98–111)
Creatinine, Ser: 1.34 mg/dL — ABNORMAL HIGH (ref 0.61–1.24)
GFR, Estimated: 52 mL/min — ABNORMAL LOW (ref >60)
Glucose, Bld: 99 mg/dL (ref 70–99)
Potassium: 4.4 mmol/L (ref 3.5–5.1)
Sodium: 137 mmol/L (ref 135–145)

## 2022-05-08 LAB — CBC
HCT: 45.3 % (ref 39.0–52.0)
Hemoglobin: 13.9 g/dL (ref 13.0–17.0)
MCH: 25 pg — ABNORMAL LOW (ref 26.0–34.0)
MCHC: 30.7 g/dL (ref 30.0–36.0)
MCV: 81.6 fL (ref 80.0–100.0)
Platelets: 203 10*3/uL (ref 150–400)
RBC: 5.55 MIL/uL (ref 4.22–5.81)
RDW: 15.3 % (ref 11.5–15.5)
WBC: 10.3 10*3/uL (ref 4.0–10.5)
nRBC: 0 % (ref 0.0–0.2)

## 2022-05-21 ENCOUNTER — Ambulatory Visit (HOSPITAL_BASED_OUTPATIENT_CLINIC_OR_DEPARTMENT_OTHER): Payer: PPO | Admitting: Anesthesiology

## 2022-05-21 ENCOUNTER — Other Ambulatory Visit: Payer: Self-pay

## 2022-05-21 ENCOUNTER — Ambulatory Visit (HOSPITAL_COMMUNITY): Payer: PPO | Admitting: Anesthesiology

## 2022-05-21 ENCOUNTER — Encounter (HOSPITAL_COMMUNITY)
Admission: RE | Disposition: A | Discharge status: Home / Self Care | Payer: Self-pay | Source: Home / Self Care | Attending: Urology

## 2022-05-21 ENCOUNTER — Observation Stay (HOSPITAL_COMMUNITY)
Admission: RE | Admit: 2022-05-21 | Discharge: 2022-05-22 | Disposition: A | Discharge status: Home / Self Care | Payer: PPO | Attending: Urology | Admitting: Urology

## 2022-05-21 ENCOUNTER — Encounter (HOSPITAL_COMMUNITY): Payer: Self-pay | Admitting: Urology

## 2022-05-21 DIAGNOSIS — N400 Enlarged prostate without lower urinary tract symptoms: Secondary | ICD-10-CM | POA: Diagnosis not present

## 2022-05-21 DIAGNOSIS — R338 Other retention of urine: Secondary | ICD-10-CM

## 2022-05-21 DIAGNOSIS — F17200 Nicotine dependence, unspecified, uncomplicated: Secondary | ICD-10-CM | POA: Insufficient documentation

## 2022-05-21 DIAGNOSIS — N401 Enlarged prostate with lower urinary tract symptoms: Secondary | ICD-10-CM | POA: Diagnosis not present

## 2022-05-21 DIAGNOSIS — R339 Retention of urine, unspecified: Secondary | ICD-10-CM | POA: Diagnosis not present

## 2022-05-21 DIAGNOSIS — Z79899 Other long term (current) drug therapy: Secondary | ICD-10-CM | POA: Diagnosis not present

## 2022-05-21 DIAGNOSIS — I10 Essential (primary) hypertension: Secondary | ICD-10-CM | POA: Insufficient documentation

## 2022-05-21 DIAGNOSIS — B3742 Candidal balanitis: Secondary | ICD-10-CM | POA: Insufficient documentation

## 2022-05-21 LAB — TYPE AND SCREEN
ABO/RH(D): O POS
Antibody Screen: NEGATIVE

## 2022-05-21 LAB — ABO/RH: ABO/RH(D): O POS

## 2022-05-21 SURGERY — ABLATION, PROSTATE, TRANSURETHRAL, USING WATERJET
Anesthesia: General

## 2022-05-21 MED ORDER — FENTANYL CITRATE PF 50 MCG/ML IJ SOSY
25.0000 ug | PREFILLED_SYRINGE | INTRAMUSCULAR | Status: DC | PRN
  Administered 2022-05-21: 50 ug via INTRAVENOUS

## 2022-05-21 MED ORDER — OXYCODONE HCL 5 MG PO TABS: 5.0000 mg | ORAL_TABLET | Freq: Once | ORAL | Status: AC | PRN

## 2022-05-21 MED ORDER — OXYCODONE HCL 5 MG PO TABS
ORAL_TABLET | ORAL | Status: AC
  Administered 2022-05-21: 5 mg via ORAL
  Filled 2022-05-21: qty 1

## 2022-05-21 MED ORDER — OXYBUTYNIN CHLORIDE 5 MG PO TABS
5.0000 mg | ORAL_TABLET | Freq: Three times a day (TID) | ORAL | Status: DC | PRN
  Administered 2022-05-21: 5 mg via ORAL
  Filled 2022-05-21: qty 1

## 2022-05-21 MED ORDER — PROPOFOL 10 MG/ML IV BOLUS
INTRAVENOUS | Status: AC
  Filled 2022-05-21: qty 20

## 2022-05-21 MED ORDER — ACETAMINOPHEN 325 MG PO TABS: 650.0000 mg | ORAL_TABLET | ORAL | Status: DC | PRN

## 2022-05-21 MED ORDER — LACTATED RINGERS IV SOLN: INTRAVENOUS | Status: DC

## 2022-05-21 MED ORDER — SODIUM CHLORIDE 0.9 % IR SOLN
Status: DC | PRN
  Administered 2022-05-21: 3000 mL
  Administered 2022-05-21: 18000 mL

## 2022-05-21 MED ORDER — ROCURONIUM BROMIDE 10 MG/ML (PF) SYRINGE
PREFILLED_SYRINGE | INTRAVENOUS | Status: AC
  Filled 2022-05-21: qty 10

## 2022-05-21 MED ORDER — ONDANSETRON HCL 4 MG/2ML IJ SOLN: 4.0000 mg | INTRAMUSCULAR | Status: DC | PRN

## 2022-05-21 MED ORDER — FENTANYL CITRATE (PF) 100 MCG/2ML IJ SOLN
INTRAMUSCULAR | Status: AC
  Filled 2022-05-21: qty 2

## 2022-05-21 MED ORDER — ONDANSETRON HCL 4 MG/2ML IJ SOLN
INTRAMUSCULAR | Status: AC
  Filled 2022-05-21: qty 2

## 2022-05-21 MED ORDER — NETARSUDIL DIMESYLATE 0.02 % OP SOLN: 1.0000 [drp] | Freq: Every day | OPHTHALMIC | Status: DC

## 2022-05-21 MED ORDER — FINASTERIDE 5 MG PO TABS
5.0000 mg | ORAL_TABLET | Freq: Every morning | ORAL | Status: DC
  Administered 2022-05-22: 5 mg via ORAL
  Filled 2022-05-21: qty 1

## 2022-05-21 MED ORDER — LIDOCAINE HCL (PF) 2 % IJ SOLN
INTRAMUSCULAR | Status: AC
  Filled 2022-05-21: qty 5

## 2022-05-21 MED ORDER — PROPOFOL 10 MG/ML IV BOLUS
INTRAVENOUS | Status: DC | PRN
  Administered 2022-05-21: 130 mg via INTRAVENOUS

## 2022-05-21 MED ORDER — ZOLPIDEM TARTRATE 5 MG PO TABS
5.0000 mg | ORAL_TABLET | Freq: Every evening | ORAL | Status: DC | PRN
  Administered 2022-05-21: 5 mg via ORAL
  Filled 2022-05-21: qty 1

## 2022-05-21 MED ORDER — SIMVASTATIN 40 MG PO TABS
40.0000 mg | ORAL_TABLET | Freq: Every morning | ORAL | Status: DC
  Administered 2022-05-21 – 2022-05-22 (×2): 40 mg via ORAL
  Filled 2022-05-21 (×2): qty 1

## 2022-05-21 MED ORDER — PHENYLEPHRINE 80 MCG/ML (10ML) SYRINGE FOR IV PUSH (FOR BLOOD PRESSURE SUPPORT)
PREFILLED_SYRINGE | INTRAVENOUS | Status: AC
  Filled 2022-05-21: qty 10

## 2022-05-21 MED ORDER — SODIUM CHLORIDE 0.9 % IR SOLN
3000.0000 mL | Status: DC
  Administered 2022-05-21 – 2022-05-22 (×7): 3000 mL

## 2022-05-21 MED ORDER — PHENYLEPHRINE 80 MCG/ML (10ML) SYRINGE FOR IV PUSH (FOR BLOOD PRESSURE SUPPORT)
PREFILLED_SYRINGE | INTRAVENOUS | Status: DC | PRN
  Administered 2022-05-21: 80 ug via INTRAVENOUS
  Administered 2022-05-21 (×2): 160 ug via INTRAVENOUS
  Administered 2022-05-21 (×2): 80 ug via INTRAVENOUS

## 2022-05-21 MED ORDER — EPHEDRINE 5 MG/ML INJ
INTRAVENOUS | Status: AC
  Filled 2022-05-21: qty 5

## 2022-05-21 MED ORDER — CEFAZOLIN SODIUM-DEXTROSE 2-4 GM/100ML-% IV SOLN
2.0000 g | INTRAVENOUS | Status: AC
  Administered 2022-05-21: 2 g via INTRAVENOUS
  Filled 2022-05-21: qty 100

## 2022-05-21 MED ORDER — ONDANSETRON HCL 4 MG/2ML IJ SOLN
INTRAMUSCULAR | Status: DC | PRN
  Administered 2022-05-21: 4 mg via INTRAVENOUS

## 2022-05-21 MED ORDER — ROCURONIUM BROMIDE 10 MG/ML (PF) SYRINGE
PREFILLED_SYRINGE | INTRAVENOUS | Status: DC | PRN
  Administered 2022-05-21: 60 mg via INTRAVENOUS

## 2022-05-21 MED ORDER — KETOROLAC TROMETHAMINE 0.5 % OP SOLN
1.0000 [drp] | Freq: Every day | OPHTHALMIC | Status: DC
  Administered 2022-05-21: 1 [drp] via OPHTHALMIC
  Filled 2022-05-21: qty 5

## 2022-05-21 MED ORDER — TRIPLE ANTIBIOTIC 3.5-400-5000 EX OINT: 1.0000 | TOPICAL_OINTMENT | Freq: Three times a day (TID) | CUTANEOUS | Status: DC | PRN

## 2022-05-21 MED ORDER — FENTANYL CITRATE PF 50 MCG/ML IJ SOSY
PREFILLED_SYRINGE | INTRAMUSCULAR | Status: AC
  Filled 2022-05-21: qty 1

## 2022-05-21 MED ORDER — SUGAMMADEX SODIUM 200 MG/2ML IV SOLN
INTRAVENOUS | Status: DC | PRN
  Administered 2022-05-21: 200 mg via INTRAVENOUS

## 2022-05-21 MED ORDER — LIDOCAINE 2% (20 MG/ML) 5 ML SYRINGE
INTRAMUSCULAR | Status: DC | PRN
  Administered 2022-05-21: 60 mg via INTRAVENOUS

## 2022-05-21 MED ORDER — OXYCODONE HCL 5 MG PO TABS
5.0000 mg | ORAL_TABLET | ORAL | Status: DC | PRN
  Administered 2022-05-21 – 2022-05-22 (×3): 5 mg via ORAL
  Filled 2022-05-21 (×3): qty 1

## 2022-05-21 MED ORDER — EPHEDRINE SULFATE-NACL 50-0.9 MG/10ML-% IV SOSY
PREFILLED_SYRINGE | INTRAVENOUS | Status: DC | PRN
  Administered 2022-05-21 (×3): 5 mg via INTRAVENOUS

## 2022-05-21 MED ORDER — POLYVINYL ALCOHOL 1.4 % OP SOLN
1.0000 [drp] | Freq: Every day | OPHTHALMIC | Status: DC
  Administered 2022-05-21: 1 [drp] via OPHTHALMIC
  Filled 2022-05-21: qty 15

## 2022-05-21 MED ORDER — FLEET ENEMA 7-19 GM/118ML RE ENEM: 1.0000 | ENEMA | Freq: Once | RECTAL | Status: DC

## 2022-05-21 MED ORDER — SENNOSIDES-DOCUSATE SODIUM 8.6-50 MG PO TABS
2.0000 | ORAL_TABLET | Freq: Every day | ORAL | Status: DC
  Administered 2022-05-21: 2 via ORAL
  Filled 2022-05-21: qty 2

## 2022-05-21 MED ORDER — DIPHENHYDRAMINE HCL 12.5 MG/5ML PO ELIX: 12.5000 mg | ORAL_SOLUTION | Freq: Four times a day (QID) | ORAL | Status: DC | PRN

## 2022-05-21 MED ORDER — SODIUM CHLORIDE 0.9 % IV SOLN: INTRAVENOUS | Status: DC

## 2022-05-21 MED ORDER — 0.9 % SODIUM CHLORIDE (POUR BTL) OPTIME
TOPICAL | Status: DC | PRN
  Administered 2022-05-21: 1000 mL

## 2022-05-21 MED ORDER — FENTANYL CITRATE (PF) 250 MCG/5ML IJ SOLN
INTRAMUSCULAR | Status: DC | PRN
  Administered 2022-05-21: 100 ug via INTRAVENOUS
  Administered 2022-05-21 (×2): 50 ug via INTRAVENOUS

## 2022-05-21 MED ORDER — ORAL CARE MOUTH RINSE: 15.0000 mL | Freq: Once | OROMUCOSAL | Status: AC

## 2022-05-21 MED ORDER — POLYETHYL GLYCOL-PROPYL GLYCOL 0.4-0.3 % OP GEL: 1.0000 | Freq: Every day | OPHTHALMIC | Status: DC

## 2022-05-21 MED ORDER — DIPHENHYDRAMINE HCL 50 MG/ML IJ SOLN: 12.5000 mg | Freq: Four times a day (QID) | INTRAMUSCULAR | Status: DC | PRN

## 2022-05-21 MED ORDER — MORPHINE SULFATE (PF) 2 MG/ML IV SOLN: 2.0000 mg | INTRAVENOUS | Status: DC | PRN

## 2022-05-21 MED ORDER — ONDANSETRON HCL 4 MG/2ML IJ SOLN: 4.0000 mg | Freq: Once | INTRAMUSCULAR | Status: DC | PRN

## 2022-05-21 MED ORDER — CHLORHEXIDINE GLUCONATE 0.12 % MT SOLN
15.0000 mL | Freq: Once | OROMUCOSAL | Status: AC
  Administered 2022-05-21: 15 mL via OROMUCOSAL

## 2022-05-21 MED ORDER — CYCLOSPORINE 0.05 % OP EMUL
1.0000 [drp] | Freq: Two times a day (BID) | OPHTHALMIC | Status: DC
  Administered 2022-05-21 – 2022-05-22 (×2): 1 [drp] via OPHTHALMIC
  Filled 2022-05-21 (×2): qty 30

## 2022-05-21 MED ORDER — OXYCODONE HCL 5 MG/5ML PO SOLN: 5.0000 mg | Freq: Once | ORAL | Status: AC | PRN

## 2022-05-21 MED ORDER — OXYCODONE HCL 5 MG PO TABS: 5.0000 mg | ORAL_TABLET | Freq: Once | ORAL | 0 refills | Status: DC | PRN

## 2022-05-21 MED ORDER — LATANOPROST 0.005 % OP SOLN
1.0000 [drp] | Freq: Every day | OPHTHALMIC | Status: DC
  Administered 2022-05-21: 1 [drp] via OPHTHALMIC
  Filled 2022-05-21: qty 2.5

## 2022-05-21 MED ORDER — DORZOLAMIDE HCL-TIMOLOL MAL 2-0.5 % OP SOLN
1.0000 [drp] | Freq: Two times a day (BID) | OPHTHALMIC | Status: DC
  Administered 2022-05-21: 1 [drp] via OPHTHALMIC
  Filled 2022-05-21: qty 10

## 2022-05-21 SURGICAL SUPPLY — 25 items
BAG URINE DRAIN 2000ML AR STRL (UROLOGICAL SUPPLIES) ×1 IMPLANT
CANISTER SUCT 3000ML PPV (MISCELLANEOUS) ×1 IMPLANT
CATH HEMA 3WAY 30CC 22FR COUDE (CATHETERS) IMPLANT
CATH HEMA 3WAY 30CC 24FR COUDE (CATHETERS) IMPLANT
COVER MAYO STAND STRL (DRAPES) ×1 IMPLANT
DRAPE FOOT SWITCH (DRAPES) ×1 IMPLANT
GEL ULTRASOUND 8.5O AQUASONIC (MISCELLANEOUS) ×1 IMPLANT
GLOVE SURG LX STRL 7.5 STRW (GLOVE) ×1 IMPLANT
GOWN STRL REUS W/ TWL XL LVL3 (GOWN DISPOSABLE) ×1 IMPLANT
GOWN STRL REUS W/TWL XL LVL3 (GOWN DISPOSABLE) ×1
HANDPIECE AQUABEAM (MISCELLANEOUS) ×1 IMPLANT
HOLDER FOLEY CATH W/STRAP (MISCELLANEOUS) IMPLANT
LOOP CUT BIPOLAR 24F LRG (ELECTROSURGICAL) IMPLANT
MANIFOLD NEPTUNE II (INSTRUMENTS) ×1 IMPLANT
MAT ABSORB  FLUID 56X50 GRAY (MISCELLANEOUS) ×1
MAT ABSORB FLUID 56X50 GRAY (MISCELLANEOUS) ×1 IMPLANT
PACK CYSTO (CUSTOM PROCEDURE TRAY) ×1 IMPLANT
PACK DRAPE AQUABEAM (MISCELLANEOUS) ×1 IMPLANT
SYR 30ML LL (SYRINGE) ×1 IMPLANT
SYR TOOMEY IRRIG 70ML (MISCELLANEOUS) ×2
SYRINGE TOOMEY IRRIG 70ML (MISCELLANEOUS) ×2 IMPLANT
TOWEL OR 17X24 6PK STRL BLUE (TOWEL DISPOSABLE) ×1 IMPLANT
TUBING CONNECTING 10 (TUBING) ×1 IMPLANT
TUBING UROLOGY SET (TUBING) ×1 IMPLANT
UNDERPAD 30X36 HEAVY ABSORB (UNDERPADS AND DIAPERS) ×1 IMPLANT

## 2022-05-21 NOTE — H&P (Signed)
CC/HPI: CC: Nocturia, right lower back pain  HPI:  89 year old male sent over for nocturia. He has nocturia ranging from 0 3 times a night, averaging about 2 times a night. He is not bothered by this. He does have sleep apnea but uses his CPAP. He really has no complaints in regards to this. He denies any other complaints including hematuria, dysuria, weak stream, straining to void. His main complaint today is right lower back pain. He is not interested in pelvic floor physical therapy. he also has erectile dysfunction. He does take nitrates for chest pain. He wants to try a vacuum erection device.   02/19/2022:  Patient presents for trial of void following retention episode diagnosed in the ED on 02/10/2022. Patient denies being on finasteride, and has completed his Keflex course. However, patient appears unsure as to what medications he is currently taking. At that time, patient unable to self cath, requiring urology consult for catheter placement necessitating Glidewire. UA with signs of infection, culture with no growth. BUN 16, creatinine 1.17, WBC 13.7. Patient has a history of retention with BPH. He did have in and out catheters, but has not needed to use them in a couple years, per his report.  Today, patient denies any concerns with catheter in the interim, and has been tolerating it well. Catheter has been draining clear, yellow urine appropriately. He endorses intermittent gross hematuria, without clots. He denies leaking of urine from around the catheter, meatal blood or discharge, dysuria, suprapubic tenderness, fever/chills, nausea/vomiting, flank pain.   02/28/2022:  Patient returns with wife at his side for second trial of void for urinary retention. He continues on tamsulosin, and is on Cipro for culture positive Pseudomonas. In the interim, he has continued to tolerate his Foley catheter well. It has been draining clear, yellow urine appropriately. He denies leaking from around the  catheter, meatal discharge or blood. He further denies suprapubic discomfort or tenderness, fevers/chills, nausea/vomiting, flank pain.   03/14/2022:  Patient returns with wife at his side for close f/u s/p successful TOV. He continues on Flomax. Unfortunately, patient had recurrence of retention within 24 hours of last successful trial of void, and return to ED for catheter placement on 02/28/2022. Patient does have self catheters but is no longer able to advance them at this point. Urology was consulted at time of ED visit for catheter placement. Glidewire was used. Since then, patient has been tolerating his catheter well. Within the past 48 hours however, he has had recurrence of leaking from around catheter and return of gross hematuria. He otherwise denies suprapubic discomfort, fever/chills/sweats, nausea/vomiting, flank pain. Of note, patient states that he has been getting dizzy on days when he takes his tamsulosin pill. He he had decreased frequency to every other day due to concerns of orthostasis.   03/28/2022  Patient underwent urodynamics. He had several involuntary detrusor contractions/evidence of detrusor overactivity. He was able to generate a voluntary contraction and void a small amount. He maintains a Foley catheter at this time. Has not had prostate imaging for prostate sizing. Creatinine on 02/10/2022 was 1.17 with a GFR of greater than 60.   04/03/2022  Patient comes in with a clogged catheter. Not draining well. We removed and exchanged for an 49 [place] Foley.   04/04/2022  Patient underwent a prostate ultrasound today that revealed a prostate size of 156 g. He remains in retention. Still having problems with leakage around his catheter. May be having bladder spasms. Presents for cystoscopy.  04/19/2022:  Patient presents with his wife as an add-on on my schedule with concerns of catheter not draining. He is on Flomax as prescribed. Last night, patient and his wife stated that his  catheter was clogged, with some discomfort on patient's part. This morning, there was some urine in the bag, but patient is also leaking from around the catheter. He denies frank dysuria, gross hematuria, suprapubic discomfort, flank pain, fever/chills, nausea/vomiting.   04/23/2022:  Patient returns with his wife at his side as an acute add-on with recurrent concern of obstructing clot with catheter not draining. Patient denies suprapubic discomfort, flank pain, fever/chills, nausea/vomiting.     ALLERGIES: No Allergies    MEDICATIONS: Ketorolac Tromethamine  Simvastatin 40 mg tablet  Tamsulosin Hcl 0.4 mg capsule 1 capsule PO Q HS  Ciprofloxacin Hcl 500 mg tablet  Combigan  Isosorbide Mononitrate Er 60 mg tablet, extended release 24 hr  Lumigan  Nystatin 100,000 unit/gram ointment 1 gram Topical BID  Restasis  Rhopressa  Vitamin C  Vitamin D2     GU PSH: Complex cystometrogram, w/ void pressure and urethral pressure profile studies, any technique - 03/21/2022 Complex Uroflow - 03/21/2022 Cystoscopy - 04/04/2022 Emg surf Electrd - 03/21/2022 Inject For cystogram - 03/21/2022 Intrabd voidng Press - 03/21/2022     NON-GU PSH: Appendectomy - 2010 Remove Tonsils - 2010     GU PMH: Urinary Retention - 04/19/2022, - 04/11/2022, - 04/04/2022, - 04/03/2022, - 03/28/2022, - 03/21/2022, - 03/14/2022, - 02/28/2022, - 02/19/2022 BPH w/LUTS - 04/04/2022, - 04/03/2022, - 03/28/2022, Benign prostatic hyperplasia with urinary obstruction, - 2017 Detrusor overactivity - 04/04/2022, - 03/28/2022 Acute Cystitis/UTI - 03/14/2022, - 02/28/2022 Low back pain (Stable) - 12/01/2019, back pain, not of urologic etiology, - 2017 Nocturia - 12/01/2019 Urinary Obstruction - 2018 Renal calculus (Improving), History of kidney stones, none of evident today - 2017, Nephrolithiasis, - 2017 Elevated PSA, Elevated prostate specific antigen (PSA) - 2017 Male ED, unspecified, Erectile dysfunction - 2016 Hematuria, Unspec,  Hematuria - 2014 Ureteral calculus, Calculus of ureter - 2014, Ureteral Stone, - 2014 Urinary Tract Inf, Unspec site, Urinary tract infection - 2014    NON-GU PMH: Encounter for general adult medical examination without abnormal findings, Encounter for preventive health examination - 2016 Personal history of other diseases of the circulatory system, History of hypertension - 2014 Personal history of other diseases of the nervous system and sense organs, History of sleep apnea - 2014, History of glaucoma, - 2014 Hypertension Sleep Apnea    Immunizations: None   FAMILY HISTORY: 2 daughters - Other 1 son - Other Death In The Family Father - Father Death In The Family Mother - Mother Family Health Status Number - Runs In Family rectal cancer - Mother   SOCIAL HISTORY: Marital Status: Married Preferred Language: English; Ethnicity: Not Hispanic Or Latino; Race: White Current Smoking Status: Patient smokes occasionally.  Does drink.  Drinks 2 caffeinated drinks per day.     Notes: Current smoker on some days, Tobacco Use, Alcohol Use, Daily Coffee Consumption (___ Cups/Day), Retired From Work   REVIEW OF SYSTEMS:    GU Review Male:   Patient denies frequent urination, hard to postpone urination, burning/ pain with urination, get up at night to urinate, leakage of urine, stream starts and stops, trouble starting your stream, have to strain to urinate , erection problems, and penile pain.  Gastrointestinal (Upper):   Patient denies nausea, vomiting, and indigestion/ heartburn.  Gastrointestinal (Lower):   Patient denies diarrhea and constipation.  Constitutional:   Patient denies fever, night sweats, weight loss, and fatigue.  Skin:   Patient denies itching and skin rash/ lesion.  Eyes:   Patient denies blurred vision and double vision.  Ears/ Nose/ Throat:   Patient denies sore throat and sinus problems.  Hematologic/Lymphatic:   Patient denies swollen glands and easy bruising.   Cardiovascular:   Patient denies leg swelling and chest pains.  Respiratory:   Patient denies cough and shortness of breath.  Endocrine:   Patient denies excessive thirst.  Musculoskeletal:   Patient denies back pain and joint pain.  Neurological:   Patient denies headaches and dizziness.  Psychologic:   Patient denies depression and anxiety.   Notes: Patient stated he not able to urinate through the catheter and it was blood in urine but now nothing. Patient is having a lot of discomfort.    VITAL SIGNS:      04/23/2022 11:27 AM  BP 122/76 mmHg  Pulse 84 /min  Temperature 97.9 F / 36.6 C   GU PHYSICAL EXAMINATION:      Notes: Penile Foley catheter in place, with minimal output since this morning. Scant red urine in bag. Noted improvement on balanitis, with generalized improvement of erythema. Residual 1.5 cm lesion with continued erythema.   MULTI-SYSTEM PHYSICAL EXAMINATION:    Constitutional: Well-nourished. No physical deformities. Normally developed. Good grooming.  Neck: Neck symmetrical, not swollen. Normal tracheal position.  Respiratory: No labored breathing, no use of accessory muscles.   Cardiovascular: Normal temperature, normal extremity pulses, no swelling.   Skin: No paleness, no jaundice, no cyanosis.   Neurologic / Psychiatric: Oriented to time, oriented to place, oriented to person. No depression, no anxiety, no agitation.  Gastrointestinal: No mass, no suprapubic or bilateral CVA tenderness, no rigidity, non obese abdomen.   Musculoskeletal: Normal gait and station of head and neck.     Complexity of Data:  Source Of History:  Patient, Family/Caregiver, Medical Record Summary  Records Review:   Previous Doctor Records, Previous Patient Records   10/05/09 06/10/08 03/20/05  PSA  Total PSA 6.35  3.24  1.83   Free PSA 1.93     % Free PSA 30.4       PROCEDURES: None   ASSESSMENT:      ICD-10 Details  1 GU:   Urinary Retention - R33.8 Chronic, Stable  2    Candidal balanitis - 0000000 Acute, Uncomplicated, Improving   PLAN:           Schedule Return Visit/Planned Activity: Other See Visit Notes - Schedule Surgery          Document Letter(s):  Created for Patient: Clinical Summary         Notes:   Catheter flushed today until water returned clear, with multiple smaller clots evacuated. Patient noted relief of discomfort. He does have some urethral irritation, for which we advised use of AZO. Counseled for urine color change with AZO use. We will follow-up with his urologist with regards to plans for prostatic intervention, and advise patient and his wife accordingly. Return to clinic advised with recurrence of catheter obstruction, or other concerns with Foley catheter, development of irritative, obstructive, or constitutional symptomologies. Continue on tamsulosin. Patient and his wife both voiced understanding and are amenable to this plan.   Signed by Ashok Norris, PA-C on 04/23/22 at 12:03 PM (EST

## 2022-05-21 NOTE — Transfer of Care (Addendum)
Immediate Anesthesia Transfer of Care Note  Patient: Glenn Patel  Procedure(s) Performed: TRANSURETHRAL WATERJET ABLATION OF PROSTATE  Patient Location: PACU  Anesthesia Type:General  Level of Consciousness: drowsy and patient cooperative  Airway & Oxygen Therapy: Patient Spontanous Breathing and Patient connected to face mask oxygen  Post-op Assessment: Report given to RN and Post -op Vital signs reviewed and stable  Post vital signs: Reviewed and stable  Last Vitals:  Vitals Value Taken Time  BP 131/70 05/21/22 1105  Temp    Pulse 76 05/21/22 1108  Resp 14 05/21/22 1108  SpO2 100 % 05/21/22 1108  Vitals shown include unvalidated device data.  Last Pain:  Vitals:   05/21/22 0650  TempSrc:   PainSc: 0-No pain         Complications: No notable events documented.

## 2022-05-21 NOTE — Anesthesia Procedure Notes (Signed)
Procedure Name: Intubation Date/Time: 05/21/2022 9:03 AM  Performed by: Lollie Sails, CRNAPre-anesthesia Checklist: Patient identified, Emergency Drugs available, Suction available, Patient being monitored and Timeout performed Patient Re-evaluated:Patient Re-evaluated prior to induction Oxygen Delivery Method: Circle system utilized Preoxygenation: Pre-oxygenation with 100% oxygen Induction Type: IV induction Ventilation: Mask ventilation without difficulty Laryngoscope Size: Miller and 3 Grade View: Grade I Tube type: Oral Tube size: 8.0 mm Number of attempts: 1 Airway Equipment and Method: Stylet Placement Confirmation: ETT inserted through vocal cords under direct vision, positive ETCO2 and breath sounds checked- equal and bilateral Secured at: 24 cm Tube secured with: Tape Dental Injury: Teeth and Oropharynx as per pre-operative assessment

## 2022-05-21 NOTE — Op Note (Signed)
Preoperative diagnosis: BPH with lower urinary tract symptoms, urinary retention   postoperative diagnosis: Same   Procedure: Robotic water jet ablation of the prostate   Surgeon: Link Snuffer, MD  Resident: Hinton Rao, MD  Anesthesia: General   Indication for procedure: 89 year old male with BPH with urinary retention presents for the previously mentioned operation.  Findings:  Cystoscopy revealed by lobar hypertrophy with intravesical protrusion of the prostate.  Bladder mucosa without any tumors or masses.  Bilateral ureteral orifices in orthotopic position.  Adequate hemostasis after ablation.  Open channel.  Bilateral ureteral orifices visualized and were well away from resection.  Description of procedure:  He was brought to the operating room and placed supine on the operating table.  After adequate anesthesia he was placed lithotomy position. Timeout was performed to confirm the patient and procedure. The TRUS Stepper was mounted to the Articulating Arm and secured to OR bed. The ultrasound probe was attached to the stepper. Exam under anesthesia was performed and the TRUS was inserted per rectum.  There was no resistance. The ultrasound probe was aligned, and confirmation made that the prostate is centered and aligned using both transverse and sagittal views. The bladder neck, verumontanum and the central/transition zones were identified.  Genitalia were prepped and draped in the usual sterile fashion. The 84F AQUABEAM Handpiece is inserted into the prostatic urethra and a complete cystoscopic evaluation was performed by inspecting the prostate, bladder, and identifying the location of the verumontanum/external sphincter. The AQUABEAM Handpiece was secured to the Handpiece Articulating Arm. Confirmed alignment of AQUABEAM Handpiece and TRUS Probe to be parallel and colinear. Confirmation that AQUABEAM nozzle is centered and anterior of the bladder neck or the median lobe. The cystoscope  was then retracted to visualize the verumontanum and external sphincter and the cystoscope tip was positioned just proximal to the external sphincter. Reconfirmed alignment of the TRUS probe with the AQUABEAM Handpiece and compression applied with TRUS probe. Horizontal alignment of the Handpiece waterjet nozzle was performed. The Aquablation treatment zones were planned utilizing real-time TRUS to visualize the contour of the prostate and the depth and radial angles of resection were defined in the transverse view. In the sagittal view, the AQUABEAM nozzle is identified and position registered with software. The treatment contours were then adjusted to conform to the intended resection margins. The median lobe, bladder neck and verumontanum were marked and confirmed in the treatment contour. The Aquablation Treatment was then started following the resection contour confirmed under ultrasound guidance. TOTAL AQUABLATION RESECTION TIME: 9 minutes, 3 seconds Once Aquablation resection was complete the 24 French aqua beam handpiece was carefully removed.  The continuous-flow sheath with the visual obturator was passed and then the loop and handle.  The trigone and the ureteral orifices were identified.  Resection of some of the residual median lobe and bladder neck tissue was done.  The bladder neck was identified at 6:00 and this was taken up to 12:00 with fulguration of the bladder neck and prostate for hemostasis.  Slight amount of anterior tissue was resected.  Similarly from 6:00 up to 12:00 on the left side of the bladder neck was identified by resecting some of the ablated tissue to identify the bladder neck and cauterize any bleeding.  Some anterior tissue on the left was resected.  This created excellent hemostasis.  All the chips were evacuated.  Ureteral orifices again identified and noted to be normal without injury.  The scope was backed out and a 24 [place] hematuria catheter  was placed with 30 cc in the  balloon.  The balloon was seated at the bladder neck and it was irrigated on light traction and noted to be clear to pink.  He was hooked up to CBI.  He was cleaned up and placed supine.  Catheter was placed on traction.  He was awakened and taken to the cover room in stable condition.  Complications: None  Blood loss: 250 mL  Specimens: None  Drains: 24 French three-way hematuria catheter with 30 cc in the balloon  Disposition: Patient stable to PACU

## 2022-05-21 NOTE — Anesthesia Postprocedure Evaluation (Signed)
Anesthesia Post Note  Patient: Glenn Patel  Procedure(s) Performed: TRANSURETHRAL WATERJET ABLATION OF PROSTATE     Patient location during evaluation: PACU Anesthesia Type: General Level of consciousness: awake and alert Pain management: pain level controlled Vital Signs Assessment: post-procedure vital signs reviewed and stable Respiratory status: spontaneous breathing, nonlabored ventilation, respiratory function stable and patient connected to nasal cannula oxygen Cardiovascular status: blood pressure returned to baseline and stable Postop Assessment: no apparent nausea or vomiting Anesthetic complications: no  No notable events documented.  Last Vitals:  Vitals:   05/21/22 1215 05/21/22 1235  BP: 121/80 132/79  Pulse: 71 72  Resp: 18 16  Temp:  36.6 C  SpO2: 98% 99%    Last Pain:  Vitals:   05/21/22 1235  TempSrc: Oral  PainSc:                  [place]et [place]

## 2022-05-21 NOTE — H&P (View-Only) (Signed)
CC/HPI: CC: Nocturia, right lower back pain  HPI:  89 year old male sent over for nocturia. He has nocturia ranging from 0 3 times a night, averaging about 2 times a night. He is not bothered by this. He does have sleep apnea but uses his CPAP. He really has no complaints in regards to this. He denies any other complaints including hematuria, dysuria, weak stream, straining to void. His main complaint today is right lower back pain. He is not interested in pelvic floor physical therapy. he also has erectile dysfunction. He does take nitrates for chest pain. He wants to try a vacuum erection device.   02/19/2022:  Patient presents for trial of void following retention episode diagnosed in the ED on 02/10/2022. Patient denies being on finasteride, and has completed his Keflex course. However, patient appears unsure as to what medications he is currently taking. At that time, patient unable to self cath, requiring urology consult for catheter placement necessitating Glidewire. UA with signs of infection, culture with no growth. BUN 16, creatinine 1.17, WBC 13.7. Patient has a history of retention with BPH. He did have in and out catheters, but has not needed to use them in a couple years, per his report.  Today, patient denies any concerns with catheter in the interim, and has been tolerating it well. Catheter has been draining clear, yellow urine appropriately. He endorses intermittent gross hematuria, without clots. He denies leaking of urine from around the catheter, meatal blood or discharge, dysuria, suprapubic tenderness, fever/chills, nausea/vomiting, flank pain.   02/28/2022:  Patient returns with wife at his side for second trial of void for urinary retention. He continues on tamsulosin, and is on Cipro for culture positive Pseudomonas. In the interim, he has continued to tolerate his Foley catheter well. It has been draining clear, yellow urine appropriately. He denies leaking from around the  catheter, meatal discharge or blood. He further denies suprapubic discomfort or tenderness, fevers/chills, nausea/vomiting, flank pain.   03/14/2022:  Patient returns with wife at his side for close f/u s/p successful TOV. He continues on Flomax. Unfortunately, patient had recurrence of retention within 24 hours of last successful trial of void, and return to ED for catheter placement on 02/28/2022. Patient does have self catheters but is no longer able to advance them at this point. Urology was consulted at time of ED visit for catheter placement. Glidewire was used. Since then, patient has been tolerating his catheter well. Within the past 48 hours however, he has had recurrence of leaking from around catheter and return of gross hematuria. He otherwise denies suprapubic discomfort, fever/chills/sweats, nausea/vomiting, flank pain. Of note, patient states that he has been getting dizzy on days when he takes his tamsulosin pill. He he had decreased frequency to every other day due to concerns of orthostasis.   03/28/2022  Patient underwent urodynamics. He had several involuntary detrusor contractions/evidence of detrusor overactivity. He was able to generate a voluntary contraction and void a small amount. He maintains a Foley catheter at this time. Has not had prostate imaging for prostate sizing. Creatinine on 02/10/2022 was 1.17 with a GFR of greater than 60.   04/03/2022  Patient comes in with a clogged catheter. Not draining well. We removed and exchanged for an 89 [place] Foley.   04/04/2022  Patient underwent a prostate ultrasound today that revealed a prostate size of 156 g. He remains in retention. Still having problems with leakage around his catheter. May be having bladder spasms. Presents for cystoscopy.  04/19/2022:  Patient presents with his wife as an add-on on my schedule with concerns of catheter not draining. He is on Flomax as prescribed. Last night, patient and his wife stated that his  catheter was clogged, with some discomfort on patient's part. This morning, there was some urine in the bag, but patient is also leaking from around the catheter. He denies frank dysuria, gross hematuria, suprapubic discomfort, flank pain, fever/chills, nausea/vomiting.   04/23/2022:  Patient returns with his wife at his side as an acute add-on with recurrent concern of obstructing clot with catheter not draining. Patient denies suprapubic discomfort, flank pain, fever/chills, nausea/vomiting.     ALLERGIES: No Allergies    MEDICATIONS: Ketorolac Tromethamine  Simvastatin 40 mg tablet  Tamsulosin Hcl 0.4 mg capsule 1 capsule PO Q HS  Ciprofloxacin Hcl 500 mg tablet  Combigan  Isosorbide Mononitrate Er 60 mg tablet, extended release 24 hr  Lumigan  Nystatin 100,000 unit/gram ointment 1 gram Topical BID  Restasis  Rhopressa  Vitamin C  Vitamin D2     GU PSH: Complex cystometrogram, w/ void pressure and urethral pressure profile studies, any technique - 03/21/2022 Complex Uroflow - 03/21/2022 Cystoscopy - 04/04/2022 Emg surf Electrd - 03/21/2022 Inject For cystogram - 03/21/2022 Intrabd voidng Press - 03/21/2022     NON-GU PSH: Appendectomy - 2010 Remove Tonsils - 2010     GU PMH: Urinary Retention - 04/19/2022, - 04/11/2022, - 04/04/2022, - 04/03/2022, - 03/28/2022, - 03/21/2022, - 03/14/2022, - 02/28/2022, - 02/19/2022 BPH w/LUTS - 04/04/2022, - 04/03/2022, - 03/28/2022, Benign prostatic hyperplasia with urinary obstruction, - 2017 Detrusor overactivity - 04/04/2022, - 03/28/2022 Acute Cystitis/UTI - 03/14/2022, - 02/28/2022 Low back pain (Stable) - 12/01/2019, back pain, not of urologic etiology, - 2017 Nocturia - 12/01/2019 Urinary Obstruction - 2018 Renal calculus (Improving), History of kidney stones, none of evident today - 2017, Nephrolithiasis, - 2017 Elevated PSA, Elevated prostate specific antigen (PSA) - 2017 Male ED, unspecified, Erectile dysfunction - 2016 Hematuria, Unspec,  Hematuria - 2014 Ureteral calculus, Calculus of ureter - 2014, Ureteral Stone, - 2014 Urinary Tract Inf, Unspec site, Urinary tract infection - 2014    NON-GU PMH: Encounter for general adult medical examination without abnormal findings, Encounter for preventive health examination - 2016 Personal history of other diseases of the circulatory system, History of hypertension - 2014 Personal history of other diseases of the nervous system and sense organs, History of sleep apnea - 2014, History of glaucoma, - 2014 Hypertension Sleep Apnea    Immunizations: None   FAMILY HISTORY: 2 daughters - Other 1 son - Other Death In The Family Father - Father Death In The Family Mother - Mother Family Health Status Number - Runs In Family rectal cancer - Mother   SOCIAL HISTORY: Marital Status: Married Preferred Language: English; Ethnicity: Not Hispanic Or Latino; Race: White Current Smoking Status: Patient smokes occasionally.  Does drink.  Drinks 2 caffeinated drinks per day.     Notes: Current smoker on some days, Tobacco Use, Alcohol Use, Daily Coffee Consumption (___ Cups/Day), Retired From Work   REVIEW OF SYSTEMS:    GU Review Male:   Patient denies frequent urination, hard to postpone urination, burning/ pain with urination, get up at night to urinate, leakage of urine, stream starts and stops, trouble starting your stream, have to strain to urinate , erection problems, and penile pain.  Gastrointestinal (Upper):   Patient denies nausea, vomiting, and indigestion/ heartburn.  Gastrointestinal (Lower):   Patient denies diarrhea and constipation.  Constitutional:   Patient denies fever, night sweats, weight loss, and fatigue.  Skin:   Patient denies itching and skin rash/ lesion.  Eyes:   Patient denies blurred vision and double vision.  Ears/ Nose/ Throat:   Patient denies sore throat and sinus problems.  Hematologic/Lymphatic:   Patient denies swollen glands and easy bruising.   Cardiovascular:   Patient denies leg swelling and chest pains.  Respiratory:   Patient denies cough and shortness of breath.  Endocrine:   Patient denies excessive thirst.  Musculoskeletal:   Patient denies back pain and joint pain.  Neurological:   Patient denies headaches and dizziness.  Psychologic:   Patient denies depression and anxiety.   Notes: Patient stated he not able to urinate through the catheter and it was blood in urine but now nothing. Patient is having a lot of discomfort.    VITAL SIGNS:      04/23/2022 11:27 AM  BP 122/76 mmHg  Pulse 84 /min  Temperature 97.9 F / 36.6 C   GU PHYSICAL EXAMINATION:      Notes: Penile Foley catheter in place, with minimal output since this morning. Scant red urine in bag. Noted improvement on balanitis, with generalized improvement of erythema. Residual 1.5 cm lesion with continued erythema.   MULTI-SYSTEM PHYSICAL EXAMINATION:    Constitutional: Well-nourished. No physical deformities. Normally developed. Good grooming.  Neck: Neck symmetrical, not swollen. Normal tracheal position.  Respiratory: No labored breathing, no use of accessory muscles.   Cardiovascular: Normal temperature, normal extremity pulses, no swelling.   Skin: No paleness, no jaundice, no cyanosis.   Neurologic / Psychiatric: Oriented to time, oriented to place, oriented to person. No depression, no anxiety, no agitation.  Gastrointestinal: No mass, no suprapubic or bilateral CVA tenderness, no rigidity, non obese abdomen.   Musculoskeletal: Normal gait and station of head and neck.     Complexity of Data:  Source Of History:  Patient, Family/Caregiver, Medical Record Summary  Records Review:   Previous Doctor Records, Previous Patient Records   10/05/09 06/10/08 03/20/05  PSA  Total PSA 6.35  3.24  1.83   Free PSA 1.93     % Free PSA 30.4       PROCEDURES: None   ASSESSMENT:      ICD-10 Details  1 GU:   Urinary Retention - R33.8 Chronic, Stable  2    Candidal balanitis - 0000000 Acute, Uncomplicated, Improving   PLAN:           Schedule Return Visit/Planned Activity: Other See Visit Notes - Schedule Surgery          Document Letter(s):  Created for Patient: Clinical Summary         Notes:   Catheter flushed today until water returned clear, with multiple smaller clots evacuated. Patient noted relief of discomfort. He does have some urethral irritation, for which we advised use of AZO. Counseled for urine color change with AZO use. We will follow-up with his urologist with regards to plans for prostatic intervention, and advise patient and his wife accordingly. Return to clinic advised with recurrence of catheter obstruction, or other concerns with Foley catheter, development of irritative, obstructive, or constitutional symptomologies. Continue on tamsulosin. Patient and his wife both voiced understanding and are amenable to this plan.   Signed by Ashok Norris, PA-C on 04/23/22 at 12:03 PM (EST

## 2022-05-21 NOTE — Discharge Instructions (Signed)
Care After  Refer to this sheet in the next few weeks. These discharge instructions provide you with general information on caring for yourself after you leave the hospital. Your caregiver may also give you specific instructions. Your treatment has been planned according to the most current medical practices available, but unavoidable complications sometimes occur. If you have any problems or questions after discharge, please call your caregiver.  HOME CARE INSTRUCTIONS   Medications You may receive medicine for pain management. As your level of discomfort decreases, adjustments in your pain medicines may be made.  Take all medicines as directed.  You may be given a medicine (antibiotic) to kill germs following surgery. Finish all medicines. Let your caregiver know if you have any side effects or problems from the medicine.  If you are on aspirin, it would be best not to restart the aspirin until the blood in the urine clears Hygiene You can take a shower after surgery.  You should not take a bath while you still have the urethral catheter. Activity You will be encouraged to get out of bed as much as possible and increase your activity level as tolerated.  Spend the first week in and around your home. For 3 weeks, avoid the following:  Straining.  Running.  Strenuous work.  Walks longer than a few blocks.  Riding for extended periods.  Sexual relations.  Do not lift heavy objects (more than 20 pounds) for at least 1 month. When lifting, use your arms instead of your abdominal muscles.  You will be encouraged to walk as tolerated. Do not exert yourself. Increase your activity level slowly. Remember that it is important to keep moving after an operation of any type. This cuts down on the possibility of developing blood clots.  Your caregiver will tell you when you can resume driving and light housework. Discuss this at your first office visit after discharge. Diet No special diet is ordered  after a TURP. However, if you are on a special diet for another medical problem, it should be continued.  Normal fluid intake is usually recommended.  Avoid alcohol and caffeinated drinks for 2 weeks. They irritate the bladder. Decaffeinated drinks are okay.  Avoid spicy foods.  Bladder Function For the first 10 days, empty the bladder whenever you feel a definite desire. Do not try to hold the urine for long periods of time.  Urinating once or twice a night even after you are healed is not uncommon.  You may see some recurrence of blood in the urine after discharge from the hospital. This usually happens within 2 weeks after the procedure.If this occurs, force fluids again as you did in the hospital and reduce your activity.  Bowel Function You may experience some constipation after surgery. This can be minimized by increasing fluids and fiber in your diet. Drink enough water and fluids to keep your urine clear or pale yellow.  A stool softener may be prescribed for use at home. Do not strain to move your bowels.  If you are requiring increased pain medicine, it is important that you take stool softeners to prevent constipation. This will help to promote proper healing by reducing the need to strain to move your bowels.  Sexual Activity Semen movement in the opposite direction and into the bladder (retrograde ejaculation) may occur. Since the semen passes into the bladder, cloudy urine can occur the first time you urinate after intercourse. Or, you may not have an ejaculation during erection. Ask your caregiver  when you can resume sexual activity. Retrograde ejaculation and reduced semen discharge should not reduce one's pleasure of intercourse.  Postoperative Visit Arrange the date and time of your after surgery visit with your caregiver.  Return to Work After your recovery is complete, you will be able to return to work and resume all activities. Your caregiver will inform you when you can return  to work.    Foley Catheter Care A soft, flexible tube (Foley catheter) may have been placed in your bladder to drain urine and fluid. Follow these instructions: Taking Care of the Catheter Keep the area where the catheter leaves your body clean.  Attach the catheter to the leg so there is no tension on the catheter.  Keep the drainage bag below the level of the bladder, but keep it OFF the floor.  Do not take long soaking baths. Your caregiver will give instructions about showering.  Wash your hands before touching ANYTHING related to the catheter or bag.  Using mild soap and warm water on a washcloth:  Clean the area closest to the catheter insertion site using a circular motion around the catheter.  Clean the catheter itself by wiping AWAY from the insertion site for several inches down the tube.  NEVER wipe upward as this could sweep bacteria up into the urethra (tube in your body that normally drains the bladder) and cause infection.  Place a small amount of sterile lubricant at the tip of the penis where the catheter is entering.  Taking Care of the Drainage Bags Two drainage bags may be taken home: a large overnight drainage bag, and a smaller leg bag which fits underneath clothing.  It is okay to wear the overnight bag at any time, but NEVER wear the smaller leg bag at night.  Keep the drainage bag well below the level of your bladder. This prevents backflow of urine into the bladder and allows the urine to drain freely.  Anchor the tubing to your leg to prevent pulling or tension on the catheter. Use tape or a leg strap provided by the hospital.  Empty the drainage bag when it is 1/2 to 3/4 full. Wash your hands before and after touching the bag.  Periodically check the tubing for kinks to make sure there is no pressure on the tubing which could restrict the flow of urine.  Changing the Drainage Bags Cleanse both ends of the clean bag with alcohol before changing.  Pinch off the  rubber catheter to avoid urine spillage during the disconnection.  Disconnect the dirty bag and connect the clean one.  Empty the dirty bag carefully to avoid a urine spill.  Attach the new bag to the leg with tape or a leg strap.  Cleaning the Drainage Bags Whenever a drainage bag is disconnected, it must be cleaned quickly so it is ready for the next use.  Wash the bag in warm, soapy water.  Rinse the bag thoroughly with warm water.  Soak the bag for 30 minutes in a solution of white vinegar and water (1 cup vinegar to 1 quart warm water).  Rinse with warm water.  SEEK MEDICAL CARE IF:  You have chills or night sweats.  You are leaking around your catheter or have problems with your catheter. It is not uncommon to have sporadic leakage around your catheter as a result of bladder spasms. If the leakage stops, there is not much need for concern. If you are uncertain, call your caregiver.  You develop  side effects that you think are coming from your medicines.  SEEK IMMEDIATE MEDICAL CARE IF:  You are suddenly unable to urinate. Check to see if there are any kinks in the drainage tubing that may cause this. If you cannot find any kinks, call your caregiver immediately. This is an emergency.  You develop shortness of breath or chest pains.  Bleeding persists or clots develop in your urine.  You have a fever.  You develop pain in your back or over your lower belly (abdomen).  You develop pain or swelling in your legs.  Any problems you are having get worse rather than better.  MAKE SURE YOU:  Understand these instructions.  Will watch your condition.  Will get help right away if you are not doing well or get worse.

## 2022-05-21 NOTE — Anesthesia Preprocedure Evaluation (Addendum)
Anesthesia Evaluation  Patient identified by MRN, date of birth, ID band Patient awake    Reviewed: Allergy & Precautions, NPO status , Patient's Chart, lab work & pertinent test results  Airway Mallampati: II  TM Distance: >3 FB     Dental  (+) Dental Advisory Given, Caps, Missing, Partial Lower, Partial Upper   Pulmonary Current Smoker and Patient abstained from smoking.   Pulmonary exam normal breath sounds clear to auscultation       Cardiovascular hypertension, Pt. on medications Normal cardiovascular exam Rhythm:Regular Rate:Normal     Neuro/Psych Glaucoma  negative psych ROS   GI/Hepatic negative GI ROS, Neg liver ROS,,,  Endo/Other  negative endocrine ROS    Renal/GU Renal InsufficiencyRenal diseaseHx/o renal calculi   BPH Urinary retention    Musculoskeletal  (+) Arthritis , Osteoarthritis,    Abdominal Normal abdominal exam  (+)   Peds  Hematology negative hematology ROS (+)   Anesthesia Other Findings   Reproductive/Obstetrics                             Anesthesia Physical Anesthesia Plan  ASA: 2  Anesthesia Plan: General   Post-op Pain Management: Minimal or no pain anticipated   Induction: Intravenous  PONV Risk Score and Plan: 2  Airway Management Planned: Oral ETT  Additional Equipment: None  Intra-op Plan:   Post-operative Plan: Extubation in OR  Informed Consent: I have reviewed the patients History and Physical, chart, labs and discussed the procedure including the risks, benefits and alternatives for the proposed anesthesia with the patient or authorized representative who has indicated his/her understanding and acceptance.     Dental advisory given  Plan Discussed with: CRNA and Anesthesiologist  Anesthesia Plan Comments:        Anesthesia Quick Evaluation

## 2022-05-22 DIAGNOSIS — N401 Enlarged prostate with lower urinary tract symptoms: Secondary | ICD-10-CM | POA: Diagnosis not present

## 2022-05-22 LAB — HEMOGLOBIN AND HEMATOCRIT, BLOOD
HCT: 33.2 % — ABNORMAL LOW (ref 39.0–52.0)
Hemoglobin: 10.2 g/dL — ABNORMAL LOW (ref 13.0–17.0)

## 2022-05-22 MED ORDER — CHLORHEXIDINE GLUCONATE CLOTH 2 % EX PADS: 6.0000 | MEDICATED_PAD | Freq: Every day | CUTANEOUS | Status: DC

## 2022-05-22 NOTE — Progress Notes (Signed)
Patient to be discharged to home today. Patient and Patient's Wife given discharge teaching including all discharge Medications and schedules for these Medications. Foley and leg bag care reviewed with the patient and the Patient's Wife. Patient has had a foley and leg bags at home previously and Wife verbalizes understanding. Discharge AVS with the Patient at time of discharge

## 2022-05-22 NOTE — Discharge Summary (Signed)
[facility] Discharge Summary  Admit date: 05/21/2022  Discharge date and time: 05/22/22   Discharge to: Home  Discharge Service: Urology  Discharge Attending Physician:  Dr. Gloriann Loan  Discharge  Diagnoses: BPH (benign prostatic hyperplasia)  Secondary Diagnosis: Principal Problem:   BPH (benign prostatic hyperplasia)   OR Procedures: Procedure(s): TRANSURETHRAL WATERJET ABLATION OF PROSTATE 05/21/2022   Ancillary Procedures: None   Discharge Day Services: The patient was seen and examined by the Urology team both in the morning and immediately prior to discharge.  Vital signs and laboratory values were stable and within normal limits.  The physical exam was benign and unchanged and all surgical wounds were examined.  Discharge instructions were explained and all questions answered.  Subjective  No acute events overnight. Pain Controlled. No fever or chills.  Objective Patient Vitals for the past 8 hrs:  BP Temp Temp src Pulse Resp SpO2  05/22/22 0835 116/70 98.3 F (36.8 C) Oral 79 18 97 %  05/22/22 0546 113/67 98.4 F (36.9 C) Oral 83 17 98 %   Total I/O In: -  Out: 400 [Urine:400]  General Appearance:        No acute distress Lungs:                       Normal work of breathing on room air Heart:                                Regular rate and rhythm Abdomen:                         Soft, non-tender, non-distended. Urine light red with CBI off Extremities:                      Warm and well perfused   Hospital Course:  The patient underwent aquablation for benign prostatic hyperplasia on 05/21/2022.  The patient tolerated the procedure well, was extubated in the OR, and afterwards was taken to the PACU for routine post-surgical care. When stable the patient was transferred to the floor.   The patient did well postoperatively.  The patient's diet was slowly advanced and at the time of discharge was tolerating a regular diet.  The patient was discharged home 1 Day  Post-Op, at which point was tolerating a regular solid diet,  have adequate pain control with P.O. pain medication, and could ambulate without difficulty. The patient will follow up with [place] for post op check and catheter removale  Condition at Discharge: Improved  Discharge Medications:  Allergies as of 05/22/2022   No Known Allergies      Medication List     TAKE these medications    bimatoprost 0.01 % Soln Commonly known as: LUMIGAN Place 1 drop into both eyes at bedtime.   cephALEXin 500 MG capsule Commonly known as: KEFLEX Take 1 capsule (500 mg total) by mouth 2 (two) times daily.   cycloSPORINE 0.05 % ophthalmic emulsion Commonly known as: RESTASIS Place 1 drop into both eyes 2 (two) times daily.   dorzolamide-timolol 2-0.5 % ophthalmic solution Commonly known as: COSOPT Place 1 drop into both eyes 2 (two) times daily.   finasteride 5 MG tablet Commonly known as: PROSCAR Take 5 mg by mouth every morning.   ketorolac 0.5 % ophthalmic solution Commonly known as: ACULAR INSTILL 1 DROP INTO THE RIGHT EYE FOUR TIMES  A DAY What changed: See the new instructions.   oxyCODONE 5 MG immediate release tablet Commonly known as: Oxy IR/ROXICODONE Take 1 tablet (5 mg total) by mouth once as needed (for pain score of 1-4).   Rhopressa 0.02 % Soln Generic drug: Netarsudil Dimesylate Place 1 drop into both eyes at bedtime.   simvastatin 40 MG tablet Commonly known as: ZOCOR Take 40 mg by mouth every morning.   Systane 0.4-0.3 % Gel ophthalmic gel Generic drug: Polyethyl Glycol-Propyl Glycol Place 1 Application into both eyes at bedtime.   tamsulosin 0.4 MG Caps capsule Commonly known as: FLOMAX Take 0.4 mg by mouth at bedtime.

## 2022-05-26 ENCOUNTER — Encounter (HOSPITAL_COMMUNITY): Payer: Self-pay | Admitting: Emergency Medicine

## 2022-05-26 ENCOUNTER — Other Ambulatory Visit: Payer: Self-pay

## 2022-05-26 ENCOUNTER — Inpatient Hospital Stay (HOSPITAL_COMMUNITY)
Admission: EM | Admit: 2022-05-26 | Discharge: 2022-06-01 | DRG: 862 | Disposition: A | Discharge status: Home / Self Care | Payer: PPO | Attending: Family Medicine | Admitting: Family Medicine

## 2022-05-26 ENCOUNTER — Emergency Department (HOSPITAL_COMMUNITY): Payer: PPO

## 2022-05-26 DIAGNOSIS — Z79899 Other long term (current) drug therapy: Secondary | ICD-10-CM | POA: Diagnosis not present

## 2022-05-26 DIAGNOSIS — R42 Dizziness and giddiness: Secondary | ICD-10-CM | POA: Diagnosis present

## 2022-05-26 DIAGNOSIS — H409 Unspecified glaucoma: Secondary | ICD-10-CM | POA: Diagnosis present

## 2022-05-26 DIAGNOSIS — B9689 Other specified bacterial agents as the cause of diseases classified elsewhere: Secondary | ICD-10-CM | POA: Diagnosis not present

## 2022-05-26 DIAGNOSIS — B961 Klebsiella pneumoniae [K. pneumoniae] as the cause of diseases classified elsewhere: Secondary | ICD-10-CM | POA: Diagnosis not present

## 2022-05-26 DIAGNOSIS — R339 Retention of urine, unspecified: Secondary | ICD-10-CM | POA: Diagnosis present

## 2022-05-26 DIAGNOSIS — N390 Urinary tract infection, site not specified: Principal | ICD-10-CM | POA: Diagnosis present

## 2022-05-26 DIAGNOSIS — F17210 Nicotine dependence, cigarettes, uncomplicated: Secondary | ICD-10-CM | POA: Diagnosis not present

## 2022-05-26 DIAGNOSIS — A419 Sepsis, unspecified organism: Secondary | ICD-10-CM | POA: Diagnosis not present

## 2022-05-26 DIAGNOSIS — Z85828 Personal history of other malignant neoplasm of skin: Secondary | ICD-10-CM | POA: Diagnosis not present

## 2022-05-26 DIAGNOSIS — R509 Fever, unspecified: Secondary | ICD-10-CM | POA: Diagnosis not present

## 2022-05-26 DIAGNOSIS — Z1152 Encounter for screening for COVID-19: Secondary | ICD-10-CM | POA: Diagnosis not present

## 2022-05-26 DIAGNOSIS — R0789 Other chest pain: Secondary | ICD-10-CM | POA: Diagnosis not present

## 2022-05-26 DIAGNOSIS — Z800 Family history of malignant neoplasm of digestive organs: Secondary | ICD-10-CM | POA: Diagnosis not present

## 2022-05-26 DIAGNOSIS — N401 Enlarged prostate with lower urinary tract symptoms: Secondary | ICD-10-CM | POA: Diagnosis not present

## 2022-05-26 DIAGNOSIS — T8140XA Infection following a procedure, unspecified, initial encounter: Principal | ICD-10-CM | POA: Diagnosis present

## 2022-05-26 DIAGNOSIS — I700 Atherosclerosis of aorta: Secondary | ICD-10-CM | POA: Diagnosis not present

## 2022-05-26 DIAGNOSIS — R338 Other retention of urine: Secondary | ICD-10-CM | POA: Diagnosis not present

## 2022-05-26 DIAGNOSIS — R000 Tachycardia, unspecified: Secondary | ICD-10-CM | POA: Diagnosis not present

## 2022-05-26 DIAGNOSIS — R823 Hemoglobinuria: Secondary | ICD-10-CM | POA: Diagnosis present

## 2022-05-26 DIAGNOSIS — I351 Nonrheumatic aortic (valve) insufficiency: Secondary | ICD-10-CM | POA: Diagnosis not present

## 2022-05-26 DIAGNOSIS — A4181 Sepsis due to Enterococcus: Secondary | ICD-10-CM | POA: Diagnosis not present

## 2022-05-26 DIAGNOSIS — Z87442 Personal history of urinary calculi: Secondary | ICD-10-CM

## 2022-05-26 DIAGNOSIS — N289 Disorder of kidney and ureter, unspecified: Secondary | ICD-10-CM | POA: Diagnosis not present

## 2022-05-26 DIAGNOSIS — G4730 Sleep apnea, unspecified: Secondary | ICD-10-CM | POA: Diagnosis present

## 2022-05-26 DIAGNOSIS — F17200 Nicotine dependence, unspecified, uncomplicated: Secondary | ICD-10-CM | POA: Diagnosis not present

## 2022-05-26 DIAGNOSIS — B952 Enterococcus as the cause of diseases classified elsewhere: Secondary | ICD-10-CM

## 2022-05-26 DIAGNOSIS — I088 Other rheumatic multiple valve diseases: Secondary | ICD-10-CM | POA: Diagnosis not present

## 2022-05-26 DIAGNOSIS — R319 Hematuria, unspecified: Secondary | ICD-10-CM | POA: Diagnosis not present

## 2022-05-26 DIAGNOSIS — R7881 Bacteremia: Secondary | ICD-10-CM

## 2022-05-26 DIAGNOSIS — Z960 Presence of urogenital implants: Secondary | ICD-10-CM | POA: Diagnosis present

## 2022-05-26 DIAGNOSIS — R58 Hemorrhage, not elsewhere classified: Secondary | ICD-10-CM | POA: Diagnosis not present

## 2022-05-26 DIAGNOSIS — I10 Essential (primary) hypertension: Secondary | ICD-10-CM | POA: Diagnosis not present

## 2022-05-26 DIAGNOSIS — M1990 Unspecified osteoarthritis, unspecified site: Secondary | ICD-10-CM | POA: Diagnosis not present

## 2022-05-26 DIAGNOSIS — N529 Male erectile dysfunction, unspecified: Secondary | ICD-10-CM | POA: Diagnosis present

## 2022-05-26 DIAGNOSIS — E876 Hypokalemia: Secondary | ICD-10-CM | POA: Diagnosis present

## 2022-05-26 DIAGNOSIS — N9989 Other postprocedural complications and disorders of genitourinary system: Secondary | ICD-10-CM | POA: Diagnosis not present

## 2022-05-26 DIAGNOSIS — B3742 Candidal balanitis: Secondary | ICD-10-CM | POA: Diagnosis present

## 2022-05-26 DIAGNOSIS — E785 Hyperlipidemia, unspecified: Secondary | ICD-10-CM | POA: Diagnosis present

## 2022-05-26 DIAGNOSIS — Y838 Other surgical procedures as the cause of abnormal reaction of the patient, or of later complication, without mention of misadventure at the time of the procedure: Secondary | ICD-10-CM | POA: Diagnosis present

## 2022-05-26 DIAGNOSIS — N400 Enlarged prostate without lower urinary tract symptoms: Secondary | ICD-10-CM | POA: Diagnosis present

## 2022-05-26 DIAGNOSIS — R809 Proteinuria, unspecified: Secondary | ICD-10-CM | POA: Diagnosis present

## 2022-05-26 LAB — BLOOD CULTURE ID PANEL (REFLEXED) - BCID2
A.calcoaceticus-baumannii: NOT DETECTED
A.calcoaceticus-baumannii: NOT DETECTED
Bacteroides fragilis: NOT DETECTED
Bacteroides fragilis: NOT DETECTED
CTX-M ESBL: NOT DETECTED
Candida albicans: NOT DETECTED
Candida albicans: NOT DETECTED
Candida auris: NOT DETECTED
Candida auris: NOT DETECTED
Candida glabrata: NOT DETECTED
Candida glabrata: NOT DETECTED
Candida krusei: NOT DETECTED
Candida krusei: NOT DETECTED
Candida parapsilosis: NOT DETECTED
Candida parapsilosis: NOT DETECTED
Candida tropicalis: NOT DETECTED
Candida tropicalis: NOT DETECTED
Carbapenem resist OXA 48 LIKE: NOT DETECTED
Carbapenem resistance IMP: NOT DETECTED
Carbapenem resistance KPC: NOT DETECTED
Carbapenem resistance NDM: NOT DETECTED
Carbapenem resistance VIM: NOT DETECTED
Cryptococcus neoformans/gattii: NOT DETECTED
Cryptococcus neoformans/gattii: NOT DETECTED
Enterobacter cloacae complex: NOT DETECTED
Enterobacter cloacae complex: NOT DETECTED
Enterobacterales: DETECTED — AB
Enterobacterales: NOT DETECTED
Enterococcus Faecium: NOT DETECTED
Enterococcus Faecium: NOT DETECTED
Enterococcus faecalis: DETECTED — AB
Enterococcus faecalis: NOT DETECTED
Escherichia coli: NOT DETECTED
Escherichia coli: NOT DETECTED
Haemophilus influenzae: NOT DETECTED
Haemophilus influenzae: NOT DETECTED
Klebsiella aerogenes: NOT DETECTED
Klebsiella aerogenes: NOT DETECTED
Klebsiella oxytoca: NOT DETECTED
Klebsiella oxytoca: NOT DETECTED
Klebsiella pneumoniae: DETECTED — AB
Klebsiella pneumoniae: NOT DETECTED
Listeria monocytogenes: NOT DETECTED
Listeria monocytogenes: NOT DETECTED
Neisseria meningitidis: NOT DETECTED
Neisseria meningitidis: NOT DETECTED
Proteus species: NOT DETECTED
Proteus species: NOT DETECTED
Pseudomonas aeruginosa: NOT DETECTED
Pseudomonas aeruginosa: NOT DETECTED
Salmonella species: NOT DETECTED
Salmonella species: NOT DETECTED
Serratia marcescens: NOT DETECTED
Serratia marcescens: NOT DETECTED
Staphylococcus aureus (BCID): NOT DETECTED
Staphylococcus aureus (BCID): NOT DETECTED
Staphylococcus epidermidis: NOT DETECTED
Staphylococcus epidermidis: NOT DETECTED
Staphylococcus lugdunensis: NOT DETECTED
Staphylococcus lugdunensis: NOT DETECTED
Staphylococcus species: NOT DETECTED
Staphylococcus species: NOT DETECTED
Stenotrophomonas maltophilia: NOT DETECTED
Stenotrophomonas maltophilia: NOT DETECTED
Streptococcus agalactiae: NOT DETECTED
Streptococcus agalactiae: NOT DETECTED
Streptococcus pneumoniae: NOT DETECTED
Streptococcus pneumoniae: NOT DETECTED
Streptococcus pyogenes: NOT DETECTED
Streptococcus pyogenes: NOT DETECTED
Streptococcus species: NOT DETECTED
Streptococcus species: NOT DETECTED
Vancomycin resistance: NOT DETECTED

## 2022-05-26 LAB — CBC WITH DIFFERENTIAL/PLATELET
Abs Immature Granulocytes: 0.04 10*3/uL (ref 0.00–0.07)
Basophils Absolute: 0 10*3/uL (ref 0.0–0.1)
Basophils Relative: 0 %
Eosinophils Absolute: 0 10*3/uL (ref 0.0–0.5)
Eosinophils Relative: 0 %
HCT: 32.1 % — ABNORMAL LOW (ref 39.0–52.0)
Hemoglobin: 10 g/dL — ABNORMAL LOW (ref 13.0–17.0)
Immature Granulocytes: 0 %
Lymphocytes Relative: 6 %
Lymphs Abs: 0.7 10*3/uL (ref 0.7–4.0)
MCH: 24.8 pg — ABNORMAL LOW (ref 26.0–34.0)
MCHC: 31.2 g/dL (ref 30.0–36.0)
MCV: 79.7 fL — ABNORMAL LOW (ref 80.0–100.0)
Monocytes Absolute: 0.7 10*3/uL (ref 0.1–1.0)
Monocytes Relative: 6 %
Neutro Abs: 9.6 10*3/uL — ABNORMAL HIGH (ref 1.7–7.7)
Neutrophils Relative %: 88 %
Platelets: 227 10*3/uL (ref 150–400)
RBC: 4.03 MIL/uL — ABNORMAL LOW (ref 4.22–5.81)
RDW: 15.3 % (ref 11.5–15.5)
WBC: 11.1 10*3/uL — ABNORMAL HIGH (ref 4.0–10.5)
nRBC: 0 % (ref 0.0–0.2)

## 2022-05-26 LAB — URINALYSIS, W/ REFLEX TO CULTURE (INFECTION SUSPECTED)
Bilirubin Urine: NEGATIVE
Glucose, UA: NEGATIVE mg/dL
Ketones, ur: NEGATIVE mg/dL
Nitrite: NEGATIVE
Protein, ur: 100 mg/dL — AB
RBC / HPF: >50 RBC/hpf (ref 0–5)
Specific Gravity, Urine: 1.015 (ref 1.005–1.030)
WBC, UA: >50 WBC/hpf (ref 0–5)
pH: 7 (ref 5.0–8.0)

## 2022-05-26 LAB — COMPREHENSIVE METABOLIC PANEL
ALT: 15 U/L (ref 0–44)
AST: 18 U/L (ref 15–41)
Albumin: 3.4 g/dL — ABNORMAL LOW (ref 3.5–5.0)
Alkaline Phosphatase: 75 U/L (ref 38–126)
Anion gap: 8 (ref 5–15)
BUN: 12 mg/dL (ref 8–23)
CO2: 20 mmol/L — ABNORMAL LOW (ref 22–32)
Calcium: 8.7 mg/dL — ABNORMAL LOW (ref 8.9–10.3)
Chloride: 108 mmol/L (ref 98–111)
Creatinine, Ser: 0.99 mg/dL (ref 0.61–1.24)
GFR, Estimated: >60 mL/min (ref >60)
Glucose, Bld: 166 mg/dL — ABNORMAL HIGH (ref 70–99)
Potassium: 3.4 mmol/L — ABNORMAL LOW (ref 3.5–5.1)
Sodium: 136 mmol/L (ref 135–145)
Total Bilirubin: 0.9 mg/dL (ref 0.3–1.2)
Total Protein: 6.7 g/dL (ref 6.5–8.1)

## 2022-05-26 LAB — LACTIC ACID, PLASMA
Lactic Acid, Venous: 1.5 mmol/L (ref 0.5–1.9)
Lactic Acid, Venous: 1.8 mmol/L (ref 0.5–1.9)

## 2022-05-26 LAB — PROTIME-INR
INR: 1.2 (ref 0.8–1.2)
Prothrombin Time: 15.2 seconds (ref 11.4–15.2)

## 2022-05-26 LAB — RESP PANEL BY RT-PCR (RSV, FLU A&B, COVID)  RVPGX2
Influenza A by PCR: NEGATIVE
Influenza B by PCR: NEGATIVE
Resp Syncytial Virus by PCR: NEGATIVE
SARS Coronavirus 2 by RT PCR: NEGATIVE

## 2022-05-26 LAB — PHOSPHORUS: Phosphorus: 1.3 mg/dL — ABNORMAL LOW (ref 2.5–4.6)

## 2022-05-26 LAB — APTT: aPTT: 28 seconds (ref 24–36)

## 2022-05-26 LAB — MAGNESIUM: Magnesium: 1.9 mg/dL (ref 1.7–2.4)

## 2022-05-26 MED ORDER — KETOROLAC TROMETHAMINE 0.5 % OP SOLN
1.0000 [drp] | Freq: Every day | OPHTHALMIC | Status: DC
  Administered 2022-05-26 – 2022-06-01 (×7): 1 [drp] via OPHTHALMIC
  Filled 2022-05-26: qty 5

## 2022-05-26 MED ORDER — SODIUM CHLORIDE 0.9 % IV BOLUS
500.0000 mL | Freq: Once | INTRAVENOUS | Status: AC
  Administered 2022-05-26: 500 mL via INTRAVENOUS

## 2022-05-26 MED ORDER — ACETAMINOPHEN 325 MG PO TABS: 650.0000 mg | ORAL_TABLET | Freq: Four times a day (QID) | ORAL | Status: DC | PRN

## 2022-05-26 MED ORDER — CYCLOSPORINE 0.05 % OP EMUL
1.0000 [drp] | Freq: Two times a day (BID) | OPHTHALMIC | Status: DC
  Administered 2022-05-27 – 2022-06-01 (×12): 1 [drp] via OPHTHALMIC
  Filled 2022-05-26 (×13): qty 30

## 2022-05-26 MED ORDER — VANCOMYCIN HCL IN DEXTROSE 1-5 GM/200ML-% IV SOLN
1000.0000 mg | Freq: Once | INTRAVENOUS | Status: AC
  Administered 2022-05-26: 1000 mg via INTRAVENOUS
  Filled 2022-05-26: qty 200

## 2022-05-26 MED ORDER — SODIUM CHLORIDE 0.9 % IV SOLN
2.0000 g | Freq: Three times a day (TID) | INTRAVENOUS | Status: DC
  Administered 2022-05-26 (×2): 2 g via INTRAVENOUS
  Filled 2022-05-26 (×3): qty 12.5

## 2022-05-26 MED ORDER — SIMVASTATIN 20 MG PO TABS
40.0000 mg | ORAL_TABLET | Freq: Every evening | ORAL | Status: DC
  Administered 2022-05-26 – 2022-05-31 (×6): 40 mg via ORAL
  Filled 2022-05-26 (×6): qty 2

## 2022-05-26 MED ORDER — LATANOPROST 0.005 % OP SOLN
1.0000 [drp] | Freq: Every day | OPHTHALMIC | Status: DC
  Administered 2022-05-27 – 2022-05-31 (×6): 1 [drp] via OPHTHALMIC
  Filled 2022-05-26: qty 2.5

## 2022-05-26 MED ORDER — TAMSULOSIN HCL 0.4 MG PO CAPS
0.4000 mg | ORAL_CAPSULE | Freq: Every day | ORAL | Status: DC
  Administered 2022-05-27 – 2022-05-31 (×6): 0.4 mg via ORAL
  Filled 2022-05-26 (×6): qty 1

## 2022-05-26 MED ORDER — DORZOLAMIDE HCL 2 % OP SOLN
1.0000 [drp] | Freq: Two times a day (BID) | OPHTHALMIC | Status: DC
  Administered 2022-05-27 – 2022-06-01 (×12): 1 [drp] via OPHTHALMIC
  Filled 2022-05-26: qty 10

## 2022-05-26 MED ORDER — PIPERACILLIN-TAZOBACTAM 3.375 G IVPB 30 MIN
3.3750 g | Freq: Once | INTRAVENOUS | Status: AC
  Administered 2022-05-26: 3.375 g via INTRAVENOUS
  Filled 2022-05-26: qty 50

## 2022-05-26 MED ORDER — VANCOMYCIN HCL 1250 MG/250ML IV SOLN
1250.0000 mg | INTRAVENOUS | Status: DC
  Administered 2022-05-26 – 2022-05-27 (×2): 1250 mg via INTRAVENOUS
  Filled 2022-05-26 (×4): qty 250

## 2022-05-26 MED ORDER — ACETAMINOPHEN 325 MG PO TABS
650.0000 mg | ORAL_TABLET | Freq: Once | ORAL | Status: AC
  Administered 2022-05-26: 650 mg via ORAL
  Filled 2022-05-26: qty 2

## 2022-05-26 MED ORDER — POTASSIUM PHOSPHATES 15 MMOLE/5ML IV SOLN
30.0000 mmol | Freq: Once | INTRAVENOUS | Status: AC
  Administered 2022-05-26: 30 mmol via INTRAVENOUS
  Filled 2022-05-26: qty 10

## 2022-05-26 MED ORDER — ONDANSETRON HCL 4 MG/2ML IJ SOLN: 4.0000 mg | Freq: Four times a day (QID) | INTRAMUSCULAR | Status: DC | PRN

## 2022-05-26 MED ORDER — DORZOLAMIDE HCL-TIMOLOL MAL 2-0.5 % OP SOLN: 1.0000 [drp] | Freq: Two times a day (BID) | OPHTHALMIC | Status: DC

## 2022-05-26 MED ORDER — OXYCODONE HCL 5 MG PO TABS: 5.0000 mg | ORAL_TABLET | Freq: Once | ORAL | Status: DC | PRN

## 2022-05-26 MED ORDER — ACETAMINOPHEN 650 MG RE SUPP: 650.0000 mg | Freq: Four times a day (QID) | RECTAL | Status: DC | PRN

## 2022-05-26 MED ORDER — POLYETHYL GLYCOL-PROPYL GLYCOL 0.4-0.3 % OP GEL: 1.0000 | Freq: Every day | OPHTHALMIC | Status: DC

## 2022-05-26 MED ORDER — NETARSUDIL DIMESYLATE 0.02 % OP SOLN: 1.0000 [drp] | Freq: Every day | OPHTHALMIC | Status: DC

## 2022-05-26 MED ORDER — ONDANSETRON HCL 4 MG PO TABS: 4.0000 mg | ORAL_TABLET | Freq: Four times a day (QID) | ORAL | Status: DC | PRN

## 2022-05-26 MED ORDER — FINASTERIDE 5 MG PO TABS
5.0000 mg | ORAL_TABLET | Freq: Every morning | ORAL | Status: DC
  Administered 2022-05-27 – 2022-06-01 (×6): 5 mg via ORAL
  Filled 2022-05-26 (×6): qty 1

## 2022-05-26 MED ORDER — POLYVINYL ALCOHOL 1.4 % OP SOLN
1.0000 [drp] | Freq: Every day | OPHTHALMIC | Status: DC
  Administered 2022-05-27 – 2022-05-31 (×6): 1 [drp] via OPHTHALMIC
  Filled 2022-05-26: qty 15

## 2022-05-26 MED ORDER — POTASSIUM CHLORIDE IN NACL 40-0.9 MEQ/L-% IV SOLN
INTRAVENOUS | Status: AC
  Filled 2022-05-26 (×2): qty 1000

## 2022-05-26 MED ORDER — TIMOLOL MALEATE 0.5 % OP SOLN
1.0000 [drp] | Freq: Two times a day (BID) | OPHTHALMIC | Status: DC
  Administered 2022-05-27 – 2022-06-01 (×12): 1 [drp] via OPHTHALMIC
  Filled 2022-05-26: qty 5

## 2022-05-26 NOTE — H&P (Signed)
History and Physical    Patient: Glenn Patel [id number] DOB: 1933-07-22 DOA: 05/26/2022 DOS: the patient was seen and examined on 05/26/2022 PCP: Lujean Amel, MD  Patient coming from: Home  Chief Complaint:  Chief Complaint  Patient presents with   Dizziness   Post-op Problem   HPI: Glenn Patel is a 89 y.o. male with medical history significant of osteoarthritis, skin cancer, chronic kidney disease, nephrolithiasis, hyperlipidemia, urinary retention, BPH, Foley catheter insertion for the last 3 months who underwent a robotic water jet ablation of the prostate who is coming to the emergency department with complaints of progressively worse weakness over the last few days, dizziness and fever since yesterday. He denied fever, chills, rhinorrhea, sore throat, wheezing or hemoptysis.  No chest pain, palpitations, diaphoresis, PND, orthopnea or pitting edema of the lower extremities.  No abdominal pain, nausea, emesis, diarrhea, constipation, melena or hematochezia.  No flank pain, dysuria, frequency or hematuria.  No polyuria, polydipsia, polyphagia or blurred vision.   ED course: Initial vital signs were temperature 100 F, pulse 100, respiration 11, BP 141/59 mmHg O2 sat 98% on room air.  Patient received acetaminophen 650 mg IVP, Normal Saline 500 mL bolus x 2, Zosyn and vancomycin IVPB.  Lab work: Urinalysis was cloudy with large hemoglobinuria large leukocyte esterase and proteinuria 100 mg/dL.  There were more than 50 RBC, more than 50 WBC, many bacteria and positive WBC clumps on microscopic examination.  CBC showed a white count of 11.1, hemoglobin 10.0 g/dL platelets 227.  Normal PT, INR and PTT.  Coronavirus influenza and RSV PCR negative.  Lactic acid x 2 normal.  CMP showed a potassium of 3.4 and CO2 of 20 mmol/L with an anion gap of 8.  Glucose 166 mg/dL and albumin 3.4 g/dL.  The rest of the CMP measurements were normal after calcium correction.  Imaging: Portable 1 view  chest radiograph with no active disease.  There was aortic atherosclerosis.   Review of Systems: As mentioned in the history of present illness. All other systems reviewed and are negative. Past Medical History:  Diagnosis Date   Arthritis    Cancer ([place])    skin cancer on nose   Chronic kidney disease    History of kidney stones    Hyperlipidemia    Hypertension    Urinary retention due to benign prostatic hyperplasia    Past Surgical History:  Procedure Laterality Date   EYE SURGERY Bilateral    cataract surgery w/ IOL   SKIN CANCER EXCISION     on nose   TONSILLECTOMY     Social History:  reports that he has been smoking cigarettes. He has never used smokeless tobacco. He reports that he does not drink alcohol and does not use drugs.  No Known Allergies  History reviewed. No pertinent family history.  Prior to Admission medications   Medication Sig Start Date End Date Taking? Authorizing Provider  bimatoprost (LUMIGAN) 0.01 % SOLN Place 1 drop into both eyes at bedtime.    [provider]  cephALEXin (KEFLEX) 500 MG capsule Take 1 capsule (500 mg total) by mouth 2 (two) times daily. Patient not taking: Reported on 05/03/2022 02/10/22   Malvin Johns, MD  cycloSPORINE (RESTASIS) 0.05 % ophthalmic emulsion Place 1 drop into both eyes 2 (two) times daily.    [provider]  dorzolamide-timolol (COSOPT) 22.3-6.8 MG/ML ophthalmic solution Place 1 drop into both eyes 2 (two) times daily. 01/24/14   [provider]  finasteride (  PROSCAR) 5 MG tablet Take 5 mg by mouth every morning.    [provider]  ketorolac (ACULAR) 0.5 % ophthalmic solution INSTILL 1 DROP INTO THE RIGHT EYE FOUR TIMES A DAY Patient taking differently: Place 1 drop into the right eye daily. 05/24/21   Rankin, Clent Demark, MD  oxyCODONE (OXY IR/ROXICODONE) 5 MG immediate release tablet Take 1 tablet (5 mg total) by mouth once as needed (for pain score of 1-4). 05/21/22   Lucas Mallow, MD  Polyethyl Glycol-Propyl Glycol (SYSTANE) 0.4-0.3 % GEL ophthalmic gel Place 1 Application into both eyes at bedtime.    [provider]  RHOPRESSA 0.02 % SOLN Place 1 drop into both eyes at bedtime. 01/08/21   [provider]  simvastatin (ZOCOR) 40 MG tablet Take 40 mg by mouth every morning.     [provider]  tamsulosin (FLOMAX) 0.4 MG CAPS capsule Take 0.4 mg by mouth at bedtime.    [provider]    Physical Exam: Vitals:   05/26/22 0330 05/26/22 0400 05/26/22 0500 05/26/22 0530  BP: (!) 144/66 (!) 157/66 (!) 136/57 (!) 128/59  Pulse: 97 89 79 80  Resp:  (!) 27 (!) 24 (!) 24  Temp:      TempSrc:      SpO2: 98% 99% 97% 98%  Weight:      Height:       Physical Exam Vitals and nursing note reviewed.  Constitutional:      Appearance: He is obese.  HENT:     Head: Normocephalic.     Nose: No rhinorrhea.     Mouth/Throat:     Mouth: Mucous membranes are moist.  Eyes:     General: No scleral icterus.    Pupils: Pupils are equal, round, and reactive to light.  Cardiovascular:     Rate and Rhythm: Normal rate and regular rhythm.  Pulmonary:     Effort: Pulmonary effort is normal.     Breath sounds: Normal breath sounds.  Abdominal:     General: Bowel sounds are normal.     Palpations: Abdomen is soft.  Musculoskeletal:     Cervical back: Neck supple.     Right lower leg: No edema.     Left lower leg: No edema.  Skin:    General: Skin is warm and dry.  Neurological:     General: No focal deficit present.     Mental Status: He is alert and oriented to person, place, and time.  Psychiatric:        Mood and Affect: Mood normal.        Behavior: Behavior normal.   Data Reviewed:  Results are pending, will review when available.  Assessment and Plan: Principal Problem: History of:   BPH (benign prostatic hyperplasia) Status post robotic prostate water ablation.   UTI (urinary tract infection) Admit to  MedSurg/inpatient. Gentle time-limited IV hydration. Given recent instrumentation: Begin cefepime 2 g every 8 hours.. Continue vancomycin per pharmacy. Continue tamsulosin 0.4 mg p.o. at bedtime. Continue analgesics as needed. Urology will see in consult.  Active Problems:   Hypokalemia Replacing. Follow potassium level    Hypophosphatemia  Replacing. Follow level as needed.    Hyperlipidemia Continue simvastatin 40 mg p.o. daily.    Glaucoma Continue Lumigan, Restasis, Cosopt, Acular, Systane and Rhopressa eyedrops. Follow-up with ophthalmology as an outpatient.     Advance Care Planning:   Code Status: Full Code   Consults: Urology (Per Dr. Ralene Bathe,  Dr. Jenne Campus will see in consult).  Family Communication: His spouse was at bedside.  Severity of Illness: The appropriate patient status for this patient is INPATIENT. Inpatient status is judged to be reasonable and necessary in order to provide the required intensity of service to ensure the patient's safety. The patient's presenting symptoms, physical exam findings, and initial radiographic and laboratory data in the context of their chronic comorbidities is felt to place them at high risk for further clinical deterioration. Furthermore, it is not anticipated that the patient will be medically stable for discharge from the hospital within 2 midnights of admission.   * I certify that at the point of admission it is my clinical judgment that the patient will require inpatient hospital care spanning beyond 2 midnights from the point of admission due to high intensity of service, high risk for further deterioration and high frequency of surveillance required.*  Author: Reubin Milan, MD 05/26/2022 7:24 AM  For on call review [email or web address].   This document was prepared using Dragon voice recognition software and may contain some unintended transcription errors.

## 2022-05-26 NOTE — ED Provider Notes (Signed)
Glenn Patel   CSN: [id number] Arrival date & time: 05/26/22  0305     History  Chief Complaint  Patient presents with   Dizziness   Post-op Problem    Glenn Patel is a 89 y.o. male.  The history is provided by the patient, the EMS personnel, medical records and the spouse.  Dizziness Glenn Patel is a 89 y.o. male who presents to the Emergency Department complaining of chills.  He presents to the ED by EMS for evaluation of chills that started at 1am.  He had a turp performed on February 19.  He did have a Foley catheter in place for 3 months prior to the TURP and a new 1 was placed for the procedure.  Overall he was doing well after the procedure was discharged postop day 1.  Wife states that he did overall well but has been getting progressively weak throughout the week and now uses a walker to ambulate.  Today he developed penile pain and a temperature to 101 at home.  He has nausea but no vomiting, diarrhea, difficulty breathing.    Hx/o HPL.   Home Medications Prior to Admission medications   Medication Sig Start Date End Date Taking? Authorizing Provider  bimatoprost (LUMIGAN) 0.01 % SOLN Place 1 drop into both eyes at bedtime.    [provider]  cephALEXin (KEFLEX) 500 MG capsule Take 1 capsule (500 mg total) by mouth 2 (two) times daily. Patient not taking: Reported on 05/03/2022 02/10/22   Malvin Johns, MD  cycloSPORINE (RESTASIS) 0.05 % ophthalmic emulsion Place 1 drop into both eyes 2 (two) times daily.    [provider]  dorzolamide-timolol (COSOPT) 22.3-6.8 MG/ML ophthalmic solution Place 1 drop into both eyes 2 (two) times daily. 01/24/14   [provider]  finasteride (PROSCAR) 5 MG tablet Take 5 mg by mouth every morning.    [provider]  ketorolac (ACULAR) 0.5 % ophthalmic solution INSTILL 1 DROP INTO THE RIGHT EYE FOUR TIMES A DAY Patient taking differently:  Place 1 drop into the right eye daily. 05/24/21   Rankin, Clent Demark, MD  oxyCODONE (OXY IR/ROXICODONE) 5 MG immediate release tablet Take 1 tablet (5 mg total) by mouth once as needed (for pain score of 1-4). 05/21/22   Lucas Mallow, MD  Polyethyl Glycol-Propyl Glycol (SYSTANE) 0.4-0.3 % GEL ophthalmic gel Place 1 Application into both eyes at bedtime.    [provider]  RHOPRESSA 0.02 % SOLN Place 1 drop into both eyes at bedtime. 01/08/21   [provider]  simvastatin (ZOCOR) 40 MG tablet Take 40 mg by mouth every morning.     [provider]  tamsulosin (FLOMAX) 0.4 MG CAPS capsule Take 0.4 mg by mouth at bedtime.    [provider]      Allergies    Patient has no known allergies.    Review of Systems   Review of Systems  Neurological:  Positive for dizziness.  All other systems reviewed and are negative.   Physical Exam Updated Vital Signs BP (!) 128/59   Pulse 80   Temp 100 F (37.8 C) (Oral)   Resp (!) 24   Ht 6' (1.829 m)   Wt 99.8 kg   SpO2 98%   BMI 29.84 kg/m  Physical Exam Vitals and nursing Patel reviewed.  Constitutional:      General: He is in acute distress.  Appearance: He is well-developed. He is ill-appearing.  HENT:     Head: Normocephalic and atraumatic.  Cardiovascular:     Rate and Rhythm: Normal rate and regular rhythm.     Heart sounds: No murmur heard. Pulmonary:     Effort: Pulmonary effort is normal. No respiratory distress.     Comments: Decreased air movement bilaterally Abdominal:     Palpations: Abdomen is soft.     Tenderness: There is no abdominal tenderness. There is no guarding or rebound.  Genitourinary:    Comments: Mild erythema to the glans.  Foley catheter in place with dark cloudy urine in the bag.  Musculoskeletal:        General: No tenderness.  Skin:    General: Skin is warm and dry.  Neurological:     Mental Status: He is alert.     Comments: Disoriented to time and recent  events.  Global weakness  Psychiatric:        Behavior: Behavior normal.     ED Results / Procedures / Treatments   Labs (all labs ordered are listed, but only abnormal results are displayed) Labs Reviewed  COMPREHENSIVE METABOLIC PANEL - Abnormal; Notable for the following components:      Result Value   Potassium 3.4 (*)    CO2 20 (*)    Glucose, Bld 166 (*)    Calcium 8.7 (*)    Albumin 3.4 (*)    All other components within normal limits  CBC WITH DIFFERENTIAL/PLATELET - Abnormal; Notable for the following components:   WBC 11.1 (*)    RBC 4.03 (*)    Hemoglobin 10.0 (*)    HCT 32.1 (*)    MCV 79.7 (*)    MCH 24.8 (*)    Neutro Abs 9.6 (*)    All other components within normal limits  URINALYSIS, W/ REFLEX TO CULTURE (INFECTION SUSPECTED) - Abnormal; Notable for the following components:   APPearance CLOUDY (*)    Hgb urine dipstick LARGE (*)    Protein, ur 100 (*)    Leukocytes,Ua LARGE (*)    Bacteria, UA MANY (*)    All other components within normal limits  RESP PANEL BY RT-PCR (RSV, FLU A&B, COVID)  RVPGX2  CULTURE, BLOOD (ROUTINE X 2)  CULTURE, BLOOD (ROUTINE X 2)  URINE CULTURE  LACTIC ACID, PLASMA  LACTIC ACID, PLASMA  PROTIME-INR  APTT    EKG EKG Interpretation  Date/Time:  Saturday May 26 2022 03:14:14 EST Ventricular Rate:  97 PR Interval:  202 QRS Duration: 87 QT Interval:  324 QTC Calculation: 412 R Axis:   57 Text Interpretation: Sinus rhythm Abnormal R-wave progression, early transition Confirmed by Quintella Reichert [phone]) on 05/26/2022 4:22:59 AM  Radiology DG Chest Port 1 View  Result Date: 05/26/2022 CLINICAL DATA:  Questionable sepsis. EXAM: PORTABLE CHEST 1 VIEW COMPARISON:  Portable chest 10/20/2020 FINDINGS: The heart size and mediastinal contours are stable with normal cardiac size and with aortic tortuosity and atherosclerosis. Both lungs are clear. The visualized skeletal structures are unremarkable. IMPRESSION: No active  disease. Aortic atherosclerosis. Electronically Signed   By: Telford Nab M.D.   On: 05/26/2022 04:10    Procedures Procedures    Medications Ordered in ED Medications  0.9 % NaCl with KCl 40 mEq / L  infusion (has no administration in time range)  acetaminophen (TYLENOL) tablet 650 mg (has no administration in time range)    Or  acetaminophen (TYLENOL) suppository 650 mg (has no administration in  time range)  ondansetron (ZOFRAN) tablet 4 mg (has no administration in time range)    Or  ondansetron (ZOFRAN) injection 4 mg (has no administration in time range)  sodium chloride 0.9 % bolus 500 mL (0 mLs Intravenous Stopped 05/26/22 0417)  piperacillin-tazobactam (ZOSYN) IVPB 3.375 g (0 g Intravenous Stopped 05/26/22 0427)  vancomycin (VANCOCIN) IVPB 1000 mg/200 mL premix (0 mg Intravenous Stopped 05/26/22 0500)  acetaminophen (TYLENOL) tablet 650 mg (650 mg Oral Given 05/26/22 0438)  sodium chloride 0.9 % bolus 500 mL (0 mLs Intravenous Stopped 05/26/22 0534)    ED Course/ Medical Decision Making/ A&P                             Medical Decision Making Amount and/or Complexity of Data Reviewed Labs: ordered. Radiology: ordered. ECG/medicine tests: ordered.  Risk OTC drugs. Prescription drug management. Decision regarding hospitalization.   Patient status post TURP on February 19 here for evaluation of fever to 101 at home.  He is ill-appearing on evaluation with tachycardia, temperature to 100, generalized weakness and mild confusion.  He was treated with broad-spectrum antibiotics for sepsis and treated with IV fluid hydration.  CBC with mild leukocytosis.  Renal function is within normal limits.  UA is concerning for UTI, but this is from an indwelling catheter.  Discussed with Dr.Machen with Urology -will see the patient in consult.  Medicine consulted for admission for ongoing care.  Patient does appear partially improved on repeat evaluation.  His confusion has resolved and he  is currently oriented.  Patient and wife updated of findings of studies and recommendation for admission and they are in agreement treatment plan.        Final Clinical Impression(s) / ED Diagnoses Final diagnoses:  Acute UTI  Sepsis, due to unspecified organism, unspecified whether acute organ dysfunction present [facility])    Rx / DC Orders ED Discharge Orders     None         Quintella Reichert, MD 05/26/22 [phone]

## 2022-05-26 NOTE — Progress Notes (Signed)
Pharmacy Antibiotic Note  Glenn Patel is a 89 y.o. male with hx BPH and urinary retention (s/p transurethral water jet ablation of the prostate on 05/21/22) who presented to the ED on 05/26/2022 with c/o dizziness and chills.  Pharmacy has been consulted o dose vancomycin for suspicious sepsis secondary to UTI.   Today, 05/26/2022: - Tmax 100, wbc 11.1 - scr 0.99 (crcl~65)   Plan: - vancomycin 1000 mg IV x1 given in the ED at ~4a on 05/26/22. Will start vancomycin 1250 mg q24h on 05/26/22 at 4p (est AUC 474) - cefepime 2gm q8h   __________________________________________________  Height: 6' (182.9 cm) Weight: 99.8 kg (220 lb) IBW/kg (Calculated) : 77.6  Temp (24hrs), Avg:99.5 F (37.5 C), Min:97.8 F (36.6 C), Max:100 F (37.8 C)  Recent Labs  Lab 05/26/22 0327 05/26/22 0355 05/26/22 0505  WBC  --  11.1*  --   CREATININE  --  0.99  --   LATICACIDVEN 1.5  --  1.8    Estimated Creatinine Clearance: 65.5 mL/min (by C-G formula based on SCr of 0.99 mg/dL).    No Known Allergies   Thank you for allowing pharmacy to be a part of this patient's care.  Lynelle Doctor 05/26/2022 8:58 AM

## 2022-05-26 NOTE — ED Notes (Signed)
ED TO INPATIENT HANDOFF REPORT  Name/Age/Gender Glenn Patel 89 y.o. male  Code Status    Code Status Orders  (From admission, onward)           Start     Ordered   05/26/22 0723  Full code  Continuous       Question:  By:  Answer:  Consent: discussion documented in EHR   05/26/22 0724           Code Status History     Date Active Date Inactive Code Status Order ID Comments User Context   05/21/2022 1241 05/22/2022 1546 Full Code [id number]  Lucas Mallow, MD Inpatient   02/22/2014 0120 02/24/2014 1222 Full Code [id number]  Theressa Millard, MD Inpatient      Advance Directive Documentation    Flowsheet Row Most Recent Value  Type of Advance Directive Healthcare Power of Attorney, Living will  Pre-existing out of facility DNR order (yellow form or pink MOST form) --  "MOST" Form in Place? --       Home/SNF/Other Home  Chief Complaint UTI (urinary tract infection) [N39.0]  Level of Care/Admitting Diagnosis ED Disposition     ED Disposition  Admit   Condition  --   [place]: [place] [[id number]]  Level of Care: Med-Surg [16]  May admit patient to [facility] or [facility] if equivalent level of care is available:: Yes  Covid Evaluation: Confirmed COVID Negative  Diagnosis: UTI (urinary tract infection) [id number]  Admitting Physician: Vianne Bulls [id number]  Attending Physician: Vianne Bulls [id number]  Certification:: I certify this patient will need inpatient services for at least 2 midnights  Estimated Length of Stay: 3          Medical History Past Medical History:  Diagnosis Date   Arthritis    Cancer ([place])    skin cancer on nose   Chronic kidney disease    History of kidney stones    Hyperlipidemia    Hypertension    Urinary retention due to benign prostatic hyperplasia     Allergies No Known Allergies  IV Location/Drains/Wounds Patient Lines/Drains/Airways Status     Active  Line/Drains/Airways     Name Placement date Placement time Site Days   Peripheral IV 05/26/22 20 G 1" Right Antecubital 05/26/22  0327  Antecubital  less than 1   Peripheral IV 05/26/22 20 G Anterior;Left;Proximal Forearm 05/26/22  0348  Forearm  less than 1   Urethral Catheter Dr. Gloriann Loan Latex 24 Fr. 05/21/22  1043  Latex  5            Labs/Imaging Results for orders placed or performed during the hospital encounter of 05/26/22 (from the past 48 hour(s))  Urinalysis, w/ Reflex to Culture (Infection Suspected) -Urine, Catheterized; Indwelling urinary catheter     Status: Abnormal   Collection Time: 05/26/22  3:16 AM  Result Value Ref Range   Specimen Source URINE, CATHETERIZED    Color, Urine YELLOW YELLOW   APPearance CLOUDY (A) CLEAR   Specific Gravity, Urine 1.015 1.005 - 1.030   pH 7.0 5.0 - 8.0   Glucose, UA NEGATIVE NEGATIVE mg/dL   Hgb urine dipstick LARGE (A) NEGATIVE   Bilirubin Urine NEGATIVE NEGATIVE   Ketones, ur NEGATIVE NEGATIVE mg/dL   Protein, ur 100 (A) NEGATIVE mg/dL   Nitrite NEGATIVE NEGATIVE   Leukocytes,Ua LARGE (A) NEGATIVE   RBC / HPF >50 0 - 5 RBC/hpf   WBC,  UA >50 0 - 5 WBC/hpf    Comment:        Reflex urine culture not performed if WBC <=10, OR if Squamous epithelial cells >5. If Squamous epithelial cells >5 suggest recollection.    Bacteria, UA MANY (A) NONE SEEN   Squamous Epithelial / HPF 0-5 0 - 5 /HPF   WBC Clumps PRESENT    Mucus PRESENT     Comment: Performed at [facility], [place] [address], [place], [place] [address]  Lactic acid, plasma     Status: None   Collection Time: 05/26/22  3:27 AM  Result Value Ref Range   Lactic Acid, Venous 1.5 0.5 - 1.9 mmol/L    Comment: Performed at [facility], [place] [address], [place], [place] [address]  Comprehensive metabolic panel     Status: Abnormal   Collection Time: 05/26/22  3:55 AM  Result Value Ref Range   Sodium 136 135 - 145 mmol/L   Potassium 3.4  (L) 3.5 - 5.1 mmol/L   Chloride 108 98 - 111 mmol/L   CO2 20 (L) 22 - 32 mmol/L   Glucose, Bld 166 (H) 70 - 99 mg/dL    Comment: Glucose reference range applies only to samples taken after fasting for at least 8 hours.   BUN 12 8 - 23 mg/dL   Creatinine, Ser 0.99 0.61 - 1.24 mg/dL   Calcium 8.7 (L) 8.9 - 10.3 mg/dL   Total Protein 6.7 6.5 - 8.1 g/dL   Albumin 3.4 (L) 3.5 - 5.0 g/dL   AST 18 15 - 41 U/L   ALT 15 0 - 44 U/L   Alkaline Phosphatase 75 38 - 126 U/L   Total Bilirubin 0.9 0.3 - 1.2 mg/dL   GFR, Estimated >60 >60 mL/min    Comment: (NOTE) Calculated using the CKD-EPI Creatinine Equation (2021)    Anion gap 8 5 - 15    Comment: Performed at [facility], [place] [address] [place], [place] [address]  CBC with Differential     Status: Abnormal   Collection Time: 05/26/22  3:55 AM  Result Value Ref Range   WBC 11.1 (H) 4.0 - 10.5 K/uL   RBC 4.03 (L) 4.22 - 5.81 MIL/uL   Hemoglobin 10.0 (L) 13.0 - 17.0 g/dL   HCT 32.1 (L) 39.0 - 52.0 %   MCV 79.7 (L) 80.0 - 100.0 fL   MCH 24.8 (L) 26.0 - 34.0 pg   MCHC 31.2 30.0 - 36.0 g/dL   RDW 15.3 11.5 - 15.5 %   Platelets 227 150 - 400 K/uL   nRBC 0.0 0.0 - 0.2 %   Neutrophils Relative % 88 %   Neutro Abs 9.6 (H) 1.7 - 7.7 K/uL   Lymphocytes Relative 6 %   Lymphs Abs 0.7 0.7 - 4.0 K/uL   Monocytes Relative 6 %   Monocytes Absolute 0.7 0.1 - 1.0 K/uL   Eosinophils Relative 0 %   Eosinophils Absolute 0.0 0.0 - 0.5 K/uL   Basophils Relative 0 %   Basophils Absolute 0.0 0.0 - 0.1 K/uL   Immature Granulocytes 0 %   Abs Immature Granulocytes 0.04 0.00 - 0.07 K/uL    Comment: Performed at [facility], [place] [address], [place], [place] [address]  Protime-INR     Status: None   Collection Time: 05/26/22  3:55 AM  Result Value Ref Range   Prothrombin Time 15.2 11.4 - 15.2 seconds   INR 1.2 0.8 -  1.2    Comment: (NOTE) INR goal varies based on device and disease states. Performed at [facility], [place] [address], [place], [place] [address]   APTT     Status: None   Collection Time: 05/26/22  3:55 AM  Result Value Ref Range   aPTT 28 24 - 36 seconds    Comment: Performed at [facility], [place] [address] [place], [place] [address]  Resp panel by RT-PCR (RSV, Flu A&B, Covid) Anterior Nasal Swab     Status: None   Collection Time: 05/26/22  3:55 AM   Specimen: Anterior Nasal Swab  Result Value Ref Range   SARS Coronavirus 2 by RT PCR NEGATIVE NEGATIVE    Comment: (NOTE) SARS-CoV-2 target nucleic acids are NOT DETECTED.  The SARS-CoV-2 RNA is generally detectable in upper respiratory specimens during the acute phase of infection. The lowest concentration of SARS-CoV-2 viral copies this assay can detect is 138 copies/mL. A negative result does not preclude SARS-Cov-2 infection and should not be used as the sole basis for treatment or other patient management decisions. A negative result may occur with  improper specimen collection/handling, submission of specimen other than nasopharyngeal swab, presence of viral mutation(s) within the areas targeted by this assay, and inadequate number of viral copies(<138 copies/mL). A negative result must be combined with clinical observations, patient history, and epidemiological information. The expected result is Negative.  Fact Sheet for Patients:  EntrepreneurPulse.com.au  Fact Sheet for Healthcare Providers:  IncredibleEmployment.be  This test is no t yet approved or cleared by the [place] FDA and  has been authorized for detection and/or diagnosis of SARS-CoV-2 by FDA under an Emergency Use Authorization (EUA). This EUA will remain  in effect (meaning this test can be used) for the duration of the COVID-19 declaration under Section 564(b)(1) of the Act, 21 U.S.C.section 360bbb-3(b)(1), unless the authorization is terminated  or revoked sooner.        Influenza A by PCR NEGATIVE NEGATIVE   Influenza B by PCR NEGATIVE NEGATIVE    Comment: (NOTE) The Xpert Xpress SARS-CoV-2/FLU/RSV plus assay is intended as an aid in the diagnosis of influenza from Nasopharyngeal swab specimens and should not be used as a sole basis for treatment. Nasal washings and aspirates are unacceptable for Xpert Xpress SARS-CoV-2/FLU/RSV testing.  Fact Sheet for Patients: EntrepreneurPulse.com.au  Fact Sheet for Healthcare Providers: IncredibleEmployment.be  This test is not yet approved or cleared by the [place] FDA and has been authorized for detection and/or diagnosis of SARS-CoV-2 by FDA under an Emergency Use Authorization (EUA). This EUA will remain in effect (meaning this test can be used) for the duration of the COVID-19 declaration under Section 564(b)(1) of the Act, 21 U.S.C. section 360bbb-3(b)(1), unless the authorization is terminated or revoked.     Resp Syncytial Virus by PCR NEGATIVE NEGATIVE    Comment: (NOTE) Fact Sheet for Patients: EntrepreneurPulse.com.au  Fact Sheet for Healthcare Providers: IncredibleEmployment.be  This test is not yet approved or cleared by the [place] FDA and has been authorized for detection and/or diagnosis of SARS-CoV-2 by FDA under an Emergency Use Authorization (EUA). This EUA will remain in effect (meaning this test can be used) for the duration of the COVID-19 declaration under Section 564(b)(1) of the Act, 21 U.S.C. section 360bbb-3(b)(1), unless the authorization is terminated or revoked.  Performed at [facility], [place] [address] [place], [place] [address]   Lactic acid, plasma     Status:  None   Collection Time: 05/26/22  5:05 AM  Result Value Ref Range   Lactic Acid, Venous 1.8 0.5 - 1.9 mmol/L    Comment: Performed at [facility], [place] [address] [place], [place]  [address]   DG Chest Port 1 View  Result Date: 05/26/2022 CLINICAL DATA:  Questionable sepsis. EXAM: PORTABLE CHEST 1 VIEW COMPARISON:  Portable chest 10/20/2020 FINDINGS: The heart size and mediastinal contours are stable with normal cardiac size and with aortic tortuosity and atherosclerosis. Both lungs are clear. The visualized skeletal structures are unremarkable. IMPRESSION: No active disease. Aortic atherosclerosis. Electronically Signed   By: Telford Nab M.D.   On: 05/26/2022 04:10    Pending Labs Unresulted Labs (From admission, onward)     Start     Ordered   05/27/22 0500  CBC  Tomorrow morning,   R        05/26/22 0724   05/27/22 0500  Comprehensive metabolic panel  Tomorrow morning,   R        05/26/22 0724   05/26/22 0832  Magnesium  Add-on,   AD        05/26/22 0831   05/26/22 0832  Phosphorus  Add-on,   AD        05/26/22 0831   05/26/22 0316  Urine Culture  Once,   R        05/26/22 0316   05/26/22 0315  Blood Culture (routine x 2)  (Undifferentiated presentation (screening labs and basic nursing orders))  BLOOD CULTURE X 2,   STAT      05/26/22 0318            Vitals/Pain Today's Vitals   05/26/22 0732 05/26/22 0900 05/26/22 0930 05/26/22 1000  BP:  117/62 126/70 105/73  Pulse:  69 67 61  Resp:  (!) 21 (!) 21   Temp: 97.8 F (36.6 C)     TempSrc: Oral     SpO2:  98% 98% 97%  Weight:      Height:      PainSc:        Isolation Precautions No active isolations  Medications Medications  0.9 % NaCl with KCl 40 mEq / L  infusion ( Intravenous New Bag/Given 05/26/22 0846)  acetaminophen (TYLENOL) tablet 650 mg (has no administration in time range)    Or  acetaminophen (TYLENOL) suppository 650 mg (has no administration in time range)  ondansetron (ZOFRAN) tablet 4 mg (has no administration in time range)    Or  ondansetron (ZOFRAN) injection 4 mg (has no administration in time range)  ceFEPIme (MAXIPIME) 2 g in sodium chloride 0.9 % 100 mL IVPB (has no  administration in time range)  vancomycin (VANCOREADY) IVPB 1250 mg/250 mL (has no administration in time range)  sodium chloride 0.9 % bolus 500 mL (0 mLs Intravenous Stopped 05/26/22 0417)  piperacillin-tazobactam (ZOSYN) IVPB 3.375 g (0 g Intravenous Stopped 05/26/22 0427)  vancomycin (VANCOCIN) IVPB 1000 mg/200 mL premix (0 mg Intravenous Stopped 05/26/22 0500)  acetaminophen (TYLENOL) tablet 650 mg (650 mg Oral Given 05/26/22 0438)  sodium chloride 0.9 % bolus 500 mL (0 mLs Intravenous Stopped 05/26/22 0534)    Mobility manual wheelchair

## 2022-05-26 NOTE — ED Triage Notes (Signed)
Pt arrives via [facility] from home for c/o dizziness. Pt recently had surgery on prostate earlier in the week. Pt met sepsis criteria per EMS. A+Ox3 (missing time).

## 2022-05-27 DIAGNOSIS — R319 Hematuria, unspecified: Secondary | ICD-10-CM | POA: Diagnosis not present

## 2022-05-27 DIAGNOSIS — N390 Urinary tract infection, site not specified: Secondary | ICD-10-CM | POA: Diagnosis not present

## 2022-05-27 DIAGNOSIS — A419 Sepsis, unspecified organism: Secondary | ICD-10-CM

## 2022-05-27 LAB — CBC
HCT: 27 % — ABNORMAL LOW (ref 39.0–52.0)
Hemoglobin: 8.6 g/dL — ABNORMAL LOW (ref 13.0–17.0)
MCH: 25.4 pg — ABNORMAL LOW (ref 26.0–34.0)
MCHC: 31.9 g/dL (ref 30.0–36.0)
MCV: 79.6 fL — ABNORMAL LOW (ref 80.0–100.0)
Platelets: 185 10*3/uL (ref 150–400)
RBC: 3.39 MIL/uL — ABNORMAL LOW (ref 4.22–5.81)
RDW: 15.9 % — ABNORMAL HIGH (ref 11.5–15.5)
WBC: 8.4 10*3/uL (ref 4.0–10.5)
nRBC: 0 % (ref 0.0–0.2)

## 2022-05-27 LAB — URINE CULTURE

## 2022-05-27 LAB — COMPREHENSIVE METABOLIC PANEL
ALT: 14 U/L (ref 0–44)
AST: 13 U/L — ABNORMAL LOW (ref 15–41)
Albumin: 2.7 g/dL — ABNORMAL LOW (ref 3.5–5.0)
Alkaline Phosphatase: 55 U/L (ref 38–126)
Anion gap: 4 — ABNORMAL LOW (ref 5–15)
BUN: 11 mg/dL (ref 8–23)
CO2: 19 mmol/L — ABNORMAL LOW (ref 22–32)
Calcium: 8 mg/dL — ABNORMAL LOW (ref 8.9–10.3)
Chloride: 113 mmol/L — ABNORMAL HIGH (ref 98–111)
Creatinine, Ser: 1.01 mg/dL (ref 0.61–1.24)
GFR, Estimated: >60 mL/min (ref >60)
Glucose, Bld: 101 mg/dL — ABNORMAL HIGH (ref 70–99)
Potassium: 4 mmol/L (ref 3.5–5.1)
Sodium: 136 mmol/L (ref 135–145)
Total Bilirubin: 0.5 mg/dL (ref 0.3–1.2)
Total Protein: 5.8 g/dL — ABNORMAL LOW (ref 6.5–8.1)

## 2022-05-27 MED ORDER — CHLORHEXIDINE GLUCONATE CLOTH 2 % EX PADS
6.0000 | MEDICATED_PAD | Freq: Every day | CUTANEOUS | Status: DC
  Administered 2022-05-27 – 2022-05-30 (×2): 6 via TOPICAL

## 2022-05-27 MED ORDER — MELATONIN 3 MG PO TABS
3.0000 mg | ORAL_TABLET | Freq: Every evening | ORAL | Status: AC | PRN
  Administered 2022-05-27 – 2022-05-28 (×2): 3 mg via ORAL
  Filled 2022-05-27 (×2): qty 1

## 2022-05-27 MED ORDER — SODIUM CHLORIDE 0.9 % IV SOLN
2.0000 g | INTRAVENOUS | Status: DC
  Administered 2022-05-27 – 2022-06-01 (×6): 2 g via INTRAVENOUS
  Filled 2022-05-27 (×6): qty 20

## 2022-05-27 NOTE — Consult Note (Addendum)
[facility] for Infectious [facility]    Date of Admission:  05/26/2022   Total days of inpatient antibiotics 2        Reason for Consult: Ronnald Collum consulting faecalis    Principal Problem:   UTI (urinary tract infection) Active Problems:   Hypokalemia   Hyperlipidemia   BPH (benign prostatic hyperplasia)   Glaucoma   Hypophosphatemia   Assessment: 89 year old male with past medical history of CKD, nephrolithiasis, urinary retention, BPH admitted for UTI.  #E faecalis and Klebsiella pneumonia bacteremia is likely secondary to UTI #BPH SP prostate aqua ablation on 2/19 - UA showed large leukocyte estrace and nitrites, he has a history of E. coli UTI. He has a foley in (pt reports x 3 month). Urology engaged and plan for foley removal tomorrow -  Wife at bedside, and both deny proor tHx of UTI(per record review ecoli + urine Cx in 2015). He chills prior to admission Recommendations:  -Repeat blood cultures - TTE - Follow urine cultures - I spoke to lab in regards to  BCID x 2 on 2/24.  BCID on cultures obtained at 3:30 and 3:27 on 2/24 are growing E faecalis, BCID on cultures obtained at 3:27 are growing Klebsiella pneumoniae-anaerobic bottle - Continue ceftriaxone   I have personally spent 95 minutes involved in face-to-face and non-face-to-face activities for this patient on the day of the visit. Professional time spent includes the following activities: Preparing to see the patient (review of tests), Obtaining and/or reviewing separately obtained history (admission/discharge record), Performing a medically appropriate examination and/or evaluation , Ordering medications/tests/procedures, referring and communicating with other health care professionals, Documenting clinical information in the EMR, Independently interpreting results (not separately reported), Communicating results to the patient/family/caregiver, Counseling and educating the patient/family/caregiver and Care  coordination (not separately reported).   Microbiology:   Antibiotics: Vancomycin 2/23- Pip-tazo 2/23 Ceftriaxone 2/24 Cefepime 2/24  Cultures: Blood 2/24 1/2 at [facility] e faecalis 2/24 1/2 at [place] Urine 2/23 pending    HPI: Glenn Patel is a 89 y.o. male with HX of osteoarthrits, skin cancer, CKD, nephrolithiasis, HLD, urinary retention, BPH, Foley catheter insertion for the last 3 months underwent robotic water jet ablation of prostate presented with progressive weakness for few weeks, dizziness and fever day prior to admission.  On arrival to the ED she had a temp 100, WBC 11 K.  UA showed large leukocyte Estrace.  Patient admitted for UTI started on vancomycin and cefepime.  Cultures pending ID engaged as blood cultures grew Enterococcus faecalis. Chest x-ray on 2/24 showed no active disease.  Review of Systems: Review of Systems  All other systems reviewed and are negative.   Past Medical History:  Diagnosis Date   Arthritis    Cancer ([place])    skin cancer on nose   Chronic kidney disease    History of kidney stones    Hyperlipidemia    Hypertension    Urinary retention due to benign prostatic hyperplasia     Social History   Tobacco Use   Smoking status: Some Days    Types: Cigarettes   Smokeless tobacco: Never  Vaping Use   Vaping Use: Never used  Substance Use Topics   Alcohol use: No   Drug use: No    History reviewed. No pertinent family history. Scheduled Meds:  cycloSPORINE  1 drop Both Eyes BID   dorzolamide  1 drop Both Eyes BID   And   timolol  1 drop Both Eyes BID   finasteride  5 mg Oral q morning   ketorolac  1 drop Right Eye Daily   latanoprost  1 drop Both Eyes QHS   Netarsudil Dimesylate  1 drop Both Eyes QHS   polyvinyl alcohol  1 drop Both Eyes QHS   simvastatin  40 mg Oral QPM   tamsulosin  0.4 mg Oral QHS   Continuous Infusions:  cefTRIAXone (ROCEPHIN)  IV Stopped (05/27/22 0159)   vancomycin 1,250 mg  (05/26/22 1641)   PRN Meds:.acetaminophen **OR** acetaminophen, ondansetron **OR** ondansetron (ZOFRAN) IV, oxyCODONE No Known Allergies  OBJECTIVE: Blood pressure 131/72, pulse 70, temperature 98.8 F (37.1 C), temperature source Oral, resp. rate 17, height 6' (1.829 m), weight 99.8 kg, SpO2 98 %.  Physical Exam Constitutional:      General: He is not in acute distress.    Appearance: He is normal weight. He is not toxic-appearing.  HENT:     Head: Normocephalic and atraumatic.     Right Ear: External ear normal.     Left Ear: External ear normal.     Nose: No congestion or rhinorrhea.     Mouth/Throat:     Mouth: Mucous membranes are moist.     Pharynx: Oropharynx is clear.  Eyes:     Extraocular Movements: Extraocular movements intact.     Conjunctiva/sclera: Conjunctivae normal.     Pupils: Pupils are equal, round, and reactive to light.  Cardiovascular:     Rate and Rhythm: Normal rate and regular rhythm.     Heart sounds: No murmur heard.    No friction rub. No gallop.  Pulmonary:     Effort: Pulmonary effort is normal.     Breath sounds: Normal breath sounds.  Abdominal:     General: Abdomen is flat. Bowel sounds are normal.     Palpations: Abdomen is soft.  Musculoskeletal:        General: No swelling. Normal range of motion.     Cervical back: Normal range of motion and neck supple.  Skin:    General: Skin is warm and dry.  Neurological:     General: No focal deficit present.     Mental Status: He is oriented to person, place, and time.  Psychiatric:        Mood and Affect: Mood normal.     Lab Results Lab Results  Component Value Date   WBC 8.4 05/27/2022   HGB 8.6 (L) 05/27/2022   HCT 27.0 (L) 05/27/2022   MCV 79.6 (L) 05/27/2022   PLT 185 05/27/2022    Lab Results  Component Value Date   CREATININE 1.01 05/27/2022   BUN 11 05/27/2022   NA 136 05/27/2022   K 4.0 05/27/2022   CL 113 (H) 05/27/2022   CO2 19 (L) 05/27/2022    Lab Results   Component Value Date   ALT 14 05/27/2022   AST 13 (L) 05/27/2022   ALKPHOS 55 05/27/2022   BILITOT 0.5 05/27/2022       Laurice Record, [place] [facility] Infectious [facility] 05/27/2022, 1:14 PM

## 2022-05-27 NOTE — Progress Notes (Signed)
PHARMACY - PHYSICIAN COMMUNICATION CRITICAL VALUE ALERT - BLOOD CULTURE IDENTIFICATION (BCID)  Glenn Patel is an 89 y.o. male who presented to [facility] on 05/26/2022 with a chief complaint of  progressively worse weakness   Assessment: 1/4 kleb pna, no resist  Name of physician (or Provider) Contacted: Abigail Butts  Current antibiotics: vanc and cefepime  Changes to prescribed antibiotics recommended:  D/c cefepime and start CTX 2gm IV q24h  Results for orders placed or performed during the hospital encounter of 05/26/22  Blood Culture ID Panel (Reflexed) (Collected: 05/26/2022 11:30 PM)  Result Value Ref Range   Enterococcus faecalis NOT DETECTED NOT DETECTED   Enterococcus Faecium NOT DETECTED NOT DETECTED   Listeria monocytogenes NOT DETECTED NOT DETECTED   Staphylococcus species NOT DETECTED NOT DETECTED   Staphylococcus aureus (BCID) NOT DETECTED NOT DETECTED   Staphylococcus epidermidis NOT DETECTED NOT DETECTED   Staphylococcus lugdunensis NOT DETECTED NOT DETECTED   Streptococcus species NOT DETECTED NOT DETECTED   Streptococcus agalactiae NOT DETECTED NOT DETECTED   Streptococcus pneumoniae NOT DETECTED NOT DETECTED   Streptococcus pyogenes NOT DETECTED NOT DETECTED   A.calcoaceticus-baumannii NOT DETECTED NOT DETECTED   Bacteroides fragilis NOT DETECTED NOT DETECTED   Enterobacterales DETECTED (A) NOT DETECTED   Enterobacter cloacae complex NOT DETECTED NOT DETECTED   Escherichia coli NOT DETECTED NOT DETECTED   Klebsiella aerogenes NOT DETECTED NOT DETECTED   Klebsiella oxytoca NOT DETECTED NOT DETECTED   Klebsiella pneumoniae DETECTED (A) NOT DETECTED   Proteus species NOT DETECTED NOT DETECTED   Salmonella species NOT DETECTED NOT DETECTED   Serratia marcescens NOT DETECTED NOT DETECTED   Haemophilus influenzae NOT DETECTED NOT DETECTED   Neisseria meningitidis NOT DETECTED NOT DETECTED   Pseudomonas aeruginosa NOT DETECTED NOT DETECTED   Stenotrophomonas  maltophilia NOT DETECTED NOT DETECTED   Candida albicans NOT DETECTED NOT DETECTED   Candida auris NOT DETECTED NOT DETECTED   Candida glabrata NOT DETECTED NOT DETECTED   Candida krusei NOT DETECTED NOT DETECTED   Candida parapsilosis NOT DETECTED NOT DETECTED   Candida tropicalis NOT DETECTED NOT DETECTED   Cryptococcus neoformans/gattii NOT DETECTED NOT DETECTED   CTX-M ESBL NOT DETECTED NOT DETECTED   Carbapenem resistance IMP NOT DETECTED NOT DETECTED   Carbapenem resistance KPC NOT DETECTED NOT DETECTED   Carbapenem resistance NDM NOT DETECTED NOT DETECTED   Carbapenem resist OXA 48 LIKE NOT DETECTED NOT DETECTED   Carbapenem resistance VIM NOT DETECTED NOT DETECTED    Dolly Rias RPh 05/27/2022, 12:25 AM

## 2022-05-27 NOTE — Plan of Care (Signed)
  Problem: Education: Goal: Knowledge of the prescribed therapeutic regimen will improve Outcome: Progressing   Problem: Bowel/Gastric: Goal: Gastrointestinal status for postoperative course will improve Outcome: Progressing   Problem: Health Behavior/Discharge Planning: Goal: Identification of resources available to assist in meeting health care needs will improve Outcome: Progressing   Problem: Skin Integrity: Goal: Demonstration of wound healing without infection will improve Outcome: Progressing   Problem: Urinary Elimination: Goal: Ability to avoid or minimize complications of infection will improve Outcome: Progressing   Problem: Education: Goal: Knowledge of General Education information will improve Description: Including pain rating scale, medication(s)/side effects and non-pharmacologic comfort measures Outcome: Progressing   Problem: Health Behavior/Discharge Planning: Goal: Ability to manage health-related needs will improve Outcome: Progressing   Problem: Clinical Measurements: Goal: Ability to maintain clinical measurements within normal limits will improve Outcome: Progressing Goal: Respiratory complications will improve Outcome: Progressing   Problem: Activity: Goal: Risk for activity intolerance will decrease Outcome: Progressing   Problem: Nutrition: Goal: Adequate nutrition will be maintained Outcome: Progressing   Problem: Coping: Goal: Level of anxiety will decrease Outcome: Progressing   Problem: Elimination: Goal: Will not experience complications related to bowel motility Outcome: Progressing Goal: Will not experience complications related to urinary retention Outcome: Progressing   Problem: Pain Managment: Goal: General experience of comfort will improve Outcome: Progressing   Problem: Safety: Goal: Ability to remain free from injury will improve Outcome: Progressing   Problem: Skin Integrity: Goal: Risk for impaired skin  integrity will decrease Outcome: Progressing

## 2022-05-27 NOTE — Progress Notes (Signed)
PROGRESS NOTE    Glenn Patel  [id number] DOB: 1933-10-05 DOA: 05/26/2022 PCP: Lujean Amel, MD   Brief Narrative:  Glenn Patel is a 89 y.o. male with medical history significant of osteoarthritis, skin cancer, chronic kidney disease, nephrolithiasis, hyperlipidemia, urinary retention, BPH, Foley catheter insertion for the last 3 months who underwent a robotic water jet ablation of the prostate who is coming to the emergency department with complaints of progressively worse weakness over the last few days, dizziness and fever since yesterday. He denied fever, chills, rhinorrhea, sore throat, wheezing or hemoptysis.  No chest pain, palpitations, diaphoresis, PND, orthopnea or pitting edema of the lower extremities.  No abdominal pain, nausea, emesis, diarrhea, constipation, melena or hematochezia.  No flank pain, dysuria, frequency or hematuria.  No polyuria, polydipsia, polyphagia or blurred vision.    ED course: Initial vital signs were temperature 100 F, pulse 100, respiration 11, BP 141/59 mmHg O2 sat 98% on room air.  Patient received acetaminophen 650 mg IVP, Normal Saline 500 mL bolus x 2, Zosyn and vancomycin IVPB.   Lab work: Urinalysis was cloudy with large hemoglobinuria large leukocyte esterase and proteinuria 100 mg/dL.  There were more than 50 RBC, more than 50 WBC, many bacteria and positive WBC clumps on microscopic examination.  CBC showed a white count of 11.1, hemoglobin 10.0 g/dL platelets 227.  Normal PT, INR and PTT.  Coronavirus influenza and RSV PCR negative.  Lactic acid x 2 normal.  CMP showed a potassium of 3.4 and CO2 of 20 mmol/L with an anion gap of 8.  Glucose 166 mg/dL and albumin 3.4 g/dL.  The rest of the CMP measurements were normal after calcium correction.  Assessment & Plan:   Principal Problem:   UTI (urinary tract infection) Active Problems:   Hypokalemia   Hyperlipidemia   BPH (benign prostatic hyperplasia)   Glaucoma   Hypophosphatemia   #1  enterococcal faecalis/Klebsiella bacteremia secondary to urinary tract infection status post recent aqua ablation to the prostate. Transthoracic echo in a.m. ID following On vancomycin and cefepime On admission he was tachycardic febrile with leukocytosis  #2 BPH Foley in place status post aqua ablation of the prostate seen by urology okay to remove Foley catheter 05/28/2022 Continue Proscar and Flomax  #3 hyperlipidemia on statin  #4 hypokalemia/hypophosphatemia resolved  #5 glaucoma continue eyedrops     Estimated body mass index is 29.84 kg/m as calculated from the following:   Height as of this encounter: 6' (1.829 m).   Weight as of this encounter: 99.8 kg.  DVT prophylaxis: Lovenox Code Status: Full code Family Communication: Discussed with wife at bedside disposition Plan:  Status is: Inpatient Remains inpatient appropriate because: Bacteremia needing IV antibiotics   Consultants:  Infectious disease and urology  Procedures: None  antimicrobials: Vancomycin and cefepime  Subjective: Sitting up in chair anxious to go home wife by the bedside  Objective: Vitals:   05/26/22 2244 05/27/22 0242 05/27/22 0607 05/27/22 1351  BP: 115/66 117/73 131/72 (!) 149/75  Pulse: 67 70 70 78  Resp: '17 15 17   '$ Temp: 98 F (36.7 C) 98.5 F (36.9 C) 98.8 F (37.1 C) 98.6 F (37 C)  TempSrc:  Oral Oral Oral  SpO2: 99% 98% 98% 100%  Weight:      Height:        Intake/Output Summary (Last 24 hours) at 05/27/2022 1406 Last data filed at 05/27/2022 1356 Gross per 24 hour  Intake 2527.92 ml  Output 2800 ml  Net -272.08 ml   Filed Weights   05/26/22 0320  Weight: 99.8 kg    Examination: Foley in place General exam: Appears calm and comfortable  Respiratory system: Clear to auscultation. Respiratory effort normal. Cardiovascular system: S1 & S2 heard, RRR. No JVD, murmurs, rubs, gallops or clicks. No pedal edema. Gastrointestinal system: Abdomen is nondistended, soft and  nontender. No organomegaly or masses felt. Normal bowel sounds heard. Central nervous system: Alert and oriented. No focal neurological deficits. Extremities: Trace edema Skin: No rashes, lesions or ulcers Psychiatry: Judgement and insight appear normal. Mood & affect appropriate.     Data Reviewed: I have personally reviewed following labs and imaging studies  CBC: Recent Labs  Lab 05/22/22 0540 05/26/22 0355 05/27/22 0333  WBC  --  11.1* 8.4  NEUTROABS  --  9.6*  --   HGB 10.2* 10.0* 8.6*  HCT 33.2* 32.1* 27.0*  MCV  --  79.7* 79.6*  PLT  --  227 [id number]   Basic Metabolic Panel: Recent Labs  Lab 05/26/22 0355 05/27/22 0333  NA 136 136  K 3.4* 4.0  CL 108 113*  CO2 20* 19*  GLUCOSE 166* 101*  BUN 12 11  CREATININE 0.99 1.01  CALCIUM 8.7* 8.0*  MG 1.9  --   PHOS 1.3*  --    GFR: Estimated Creatinine Clearance: 64.2 mL/min (by C-G formula based on SCr of 1.01 mg/dL). Liver Function Tests: Recent Labs  Lab 05/26/22 0355 05/27/22 0333  AST 18 13*  ALT 15 14  ALKPHOS 75 55  BILITOT 0.9 0.5  PROT 6.7 5.8*  ALBUMIN 3.4* 2.7*   No results for input(s): "LIPASE", "AMYLASE" in the last 168 hours. No results for input(s): "AMMONIA" in the last 168 hours. Coagulation Profile: Recent Labs  Lab 05/26/22 0355  INR 1.2   Cardiac Enzymes: No results for input(s): "CKTOTAL", "CKMB", "CKMBINDEX", "TROPONINI" in the last 168 hours. BNP (last 3 results) No results for input(s): "PROBNP" in the last 8760 hours. HbA1C: No results for input(s): "HGBA1C" in the last 72 hours. CBG: No results for input(s): "GLUCAP" in the last 168 hours. Lipid Profile: No results for input(s): "CHOL", "HDL", "LDLCALC", "TRIG", "CHOLHDL", "LDLDIRECT" in the last 72 hours. Thyroid Function Tests: No results for input(s): "TSH", "T4TOTAL", "FREET4", "T3FREE", "THYROIDAB" in the last 72 hours. Anemia Panel: No results for input(s): "VITAMINB12", "FOLATE", "FERRITIN", "TIBC", "IRON",  "RETICCTPCT" in the last 72 hours. Sepsis Labs: Recent Labs  Lab 05/26/22 0327 05/26/22 0505  LATICACIDVEN 1.5 1.8    Recent Results (from the past 240 hour(s))  Urine Culture     Status: None (Preliminary result)   Collection Time: 05/26/22  3:16 AM   Specimen: Urine, Random  Result Value Ref Range Status   Specimen Description   Final    URINE, RANDOM Performed at [facility] [address], [place], [place] [address]    Special Requests   Final    URINE, CATHETERIZED Performed at [facility], [place] [address], [place], [place] [address]    Culture PENDING  Incomplete   Report Status PENDING  Incomplete  Blood Culture (routine x 2)     Status: None (Preliminary result)   Collection Time: 05/26/22  3:27 AM   Specimen: BLOOD  Result Value Ref Range Status   Specimen Description   Final    BLOOD RIGHT ANTECUBITAL Performed at [facility] [address] [place],  [address]    Special Requests  Final    BOTTLES DRAWN AEROBIC AND ANAEROBIC Blood Culture adequate volume Performed at [facility] [address], [place], [place] [address]    Culture  Setup Time   Final    GRAM POSITIVE COCCI AEROBIC BOTTLE ONLY GRAM NEGATIVE RODS ANAEROBIC BOTTLE ONLY CRITICAL RESULT CALLED TO, READ BACK BY AND VERIFIED WITH: PHARMD E. JACKSON 05/26/22 @ 2341 BY AB    Culture   Final    GRAM POSITIVE COCCI GRAM NEGATIVE RODS TOO YOUNG TO READ Performed at [facility], [place] [address] [place], [place] [address]    Report Status PENDING  Incomplete  Blood Culture ID Panel (Reflexed)     Status: Abnormal   Collection Time: 05/26/22  3:27 AM  Result Value Ref Range Status   Enterococcus faecalis DETECTED (A) NOT DETECTED Final    Comment: CRITICAL RESULT CALLED TO, READ BACK BY AND VERIFIED WITH: PHARMD E. JACKSON 05/26/22 @ 2341 BY AB    Enterococcus Faecium NOT DETECTED NOT DETECTED Final   Listeria  monocytogenes NOT DETECTED NOT DETECTED Final   Staphylococcus species NOT DETECTED NOT DETECTED Final   Staphylococcus aureus (BCID) NOT DETECTED NOT DETECTED Final   Staphylococcus epidermidis NOT DETECTED NOT DETECTED Final   Staphylococcus lugdunensis NOT DETECTED NOT DETECTED Final   Streptococcus species NOT DETECTED NOT DETECTED Final   Streptococcus agalactiae NOT DETECTED NOT DETECTED Final   Streptococcus pneumoniae NOT DETECTED NOT DETECTED Final   Streptococcus pyogenes NOT DETECTED NOT DETECTED Final   A.calcoaceticus-baumannii NOT DETECTED NOT DETECTED Final   Bacteroides fragilis NOT DETECTED NOT DETECTED Final   Enterobacterales NOT DETECTED NOT DETECTED Final   Enterobacter cloacae complex NOT DETECTED NOT DETECTED Final   Escherichia coli NOT DETECTED NOT DETECTED Final   Klebsiella aerogenes NOT DETECTED NOT DETECTED Final   Klebsiella oxytoca NOT DETECTED NOT DETECTED Final   Klebsiella pneumoniae NOT DETECTED NOT DETECTED Final   Proteus species NOT DETECTED NOT DETECTED Final   Salmonella species NOT DETECTED NOT DETECTED Final   Serratia marcescens NOT DETECTED NOT DETECTED Final   Haemophilus influenzae NOT DETECTED NOT DETECTED Final   Neisseria meningitidis NOT DETECTED NOT DETECTED Final   Pseudomonas aeruginosa NOT DETECTED NOT DETECTED Final   Stenotrophomonas maltophilia NOT DETECTED NOT DETECTED Final   Candida albicans NOT DETECTED NOT DETECTED Final   Candida auris NOT DETECTED NOT DETECTED Final   Candida glabrata NOT DETECTED NOT DETECTED Final   Candida krusei NOT DETECTED NOT DETECTED Final   Candida parapsilosis NOT DETECTED NOT DETECTED Final   Candida tropicalis NOT DETECTED NOT DETECTED Final   Cryptococcus neoformans/gattii NOT DETECTED NOT DETECTED Final   Vancomycin resistance NOT DETECTED NOT DETECTED Final    Comment: Performed at [facility], [address], [place], [place] [address]  Blood Culture (routine x 2)     Status:  Abnormal (Preliminary result)   Collection Time: 05/26/22  3:33 AM   Specimen: BLOOD  Result Value Ref Range Status   Specimen Description   Final    BLOOD LEFT ANTECUBITAL Performed at [facility] [address], [place], [place] [address]    Special Requests   Final    BOTTLES DRAWN AEROBIC AND ANAEROBIC Blood Culture adequate volume Performed at [facility] [address], [place], [place] [address]    Culture  Setup Time   Final    GRAM POSITIVE COCCI IN BOTH AEROBIC AND ANAEROBIC BOTTLES CRITICAL VALUE NOTED.  VALUE IS CONSISTENT WITH PREVIOUSLY REPORTED AND CALLED VALUE.    Culture (A)  Final    ENTEROCOCCUS FAECALIS SUSCEPTIBILITIES TO FOLLOW Performed at [facility], [place] [address] [place], [place] [address]    Report Status PENDING  Incomplete  Resp panel by RT-PCR (RSV, Flu A&B, Covid) Anterior Nasal Swab     Status: None   Collection Time: 05/26/22  3:55 AM   Specimen: Anterior Nasal Swab  Result Value Ref Range Status   SARS Coronavirus 2 by RT PCR NEGATIVE NEGATIVE Final    Comment: (NOTE) SARS-CoV-2 target nucleic acids are NOT DETECTED.  The SARS-CoV-2 RNA is generally detectable in upper respiratory specimens during the acute phase of infection. The lowest concentration of SARS-CoV-2 viral copies this assay can detect is 138 copies/mL. A negative result does not preclude SARS-Cov-2 infection and should not be used as the sole basis for treatment or other patient management decisions. A negative result may occur with  improper specimen collection/handling, submission of specimen other than nasopharyngeal swab, presence of viral mutation(s) within the areas targeted by this assay, and inadequate number of viral copies(<138 copies/mL). A negative result must be combined with clinical observations, patient history, and epidemiological information. The expected result is Negative.  Fact Sheet for Patients:   EntrepreneurPulse.com.au  Fact Sheet for Healthcare Providers:  IncredibleEmployment.be  This test is no t yet approved or cleared by the [place] FDA and  has been authorized for detection and/or diagnosis of SARS-CoV-2 by FDA under an Emergency Use Authorization (EUA). This EUA will remain  in effect (meaning this test can be used) for the duration of the COVID-19 declaration under Section 564(b)(1) of the Act, 21 U.S.C.section 360bbb-3(b)(1), unless the authorization is terminated  or revoked sooner.       Influenza A by PCR NEGATIVE NEGATIVE Final   Influenza B by PCR NEGATIVE NEGATIVE Final    Comment: (NOTE) The Xpert Xpress SARS-CoV-2/FLU/RSV plus assay is intended as an aid in the diagnosis of influenza from Nasopharyngeal swab specimens and should not be used as a sole basis for treatment. Nasal washings and aspirates are unacceptable for Xpert Xpress SARS-CoV-2/FLU/RSV testing.  Fact Sheet for Patients: EntrepreneurPulse.com.au  Fact Sheet for Healthcare Providers: IncredibleEmployment.be  This test is not yet approved or cleared by the [place] FDA and has been authorized for detection and/or diagnosis of SARS-CoV-2 by FDA under an Emergency Use Authorization (EUA). This EUA will remain in effect (meaning this test can be used) for the duration of the COVID-19 declaration under Section 564(b)(1) of the Act, 21 U.S.C. section 360bbb-3(b)(1), unless the authorization is terminated or revoked.     Resp Syncytial Virus by PCR NEGATIVE NEGATIVE Final    Comment: (NOTE) Fact Sheet for Patients: EntrepreneurPulse.com.au  Fact Sheet for Healthcare Providers: IncredibleEmployment.be  This test is not yet approved or cleared by the [place] FDA and has been authorized for detection and/or diagnosis of SARS-CoV-2 by FDA under an Emergency Use  Authorization (EUA). This EUA will remain in effect (meaning this test can be used) for the duration of the COVID-19 declaration under Section 564(b)(1) of the Act, 21 U.S.C. section 360bbb-3(b)(1), unless the authorization is terminated or revoked.  Performed at [facility], [place] [address] [place], [place] [address]   Blood Culture ID Panel (Reflexed)     Status: Abnormal   Collection Time: 05/26/22 11:30 PM  Result Value Ref Range Status   Enterococcus faecalis NOT DETECTED NOT DETECTED Final  Enterococcus Faecium NOT DETECTED NOT DETECTED Final   Listeria monocytogenes NOT DETECTED NOT DETECTED Final   Staphylococcus species NOT DETECTED NOT DETECTED Final   Staphylococcus aureus (BCID) NOT DETECTED NOT DETECTED Final   Staphylococcus epidermidis NOT DETECTED NOT DETECTED Final   Staphylococcus lugdunensis NOT DETECTED NOT DETECTED Final   Streptococcus species NOT DETECTED NOT DETECTED Final   Streptococcus agalactiae NOT DETECTED NOT DETECTED Final   Streptococcus pneumoniae NOT DETECTED NOT DETECTED Final   Streptococcus pyogenes NOT DETECTED NOT DETECTED Final   A.calcoaceticus-baumannii NOT DETECTED NOT DETECTED Final   Bacteroides fragilis NOT DETECTED NOT DETECTED Final   Enterobacterales DETECTED (A) NOT DETECTED Final    Comment: Enterobacterales represent a large order of gram negative bacteria, not a single organism. CRITICAL RESULT CALLED TO, READ BACK BY AND VERIFIED WITH: PHARMD E. JACKSON 05/26/22 @ 2341 BY AB    Enterobacter cloacae complex NOT DETECTED NOT DETECTED Final   Escherichia coli NOT DETECTED NOT DETECTED Final   Klebsiella aerogenes NOT DETECTED NOT DETECTED Final   Klebsiella oxytoca NOT DETECTED NOT DETECTED Final   Klebsiella pneumoniae DETECTED (A) NOT DETECTED Final    Comment: CRITICAL RESULT CALLED TO, READ BACK BY AND VERIFIED WITH: PHARMD E. JACKSON 05/26/22 @ 2341 BY AB    Proteus species NOT DETECTED NOT DETECTED  Final   Salmonella species NOT DETECTED NOT DETECTED Final   Serratia marcescens NOT DETECTED NOT DETECTED Final   Haemophilus influenzae NOT DETECTED NOT DETECTED Final   Neisseria meningitidis NOT DETECTED NOT DETECTED Final   Pseudomonas aeruginosa NOT DETECTED NOT DETECTED Final   Stenotrophomonas maltophilia NOT DETECTED NOT DETECTED Final   Candida albicans NOT DETECTED NOT DETECTED Final   Candida auris NOT DETECTED NOT DETECTED Final   Candida glabrata NOT DETECTED NOT DETECTED Final   Candida krusei NOT DETECTED NOT DETECTED Final   Candida parapsilosis NOT DETECTED NOT DETECTED Final   Candida tropicalis NOT DETECTED NOT DETECTED Final   Cryptococcus neoformans/gattii NOT DETECTED NOT DETECTED Final   CTX-M ESBL NOT DETECTED NOT DETECTED Final   Carbapenem resistance IMP NOT DETECTED NOT DETECTED Final   Carbapenem resistance KPC NOT DETECTED NOT DETECTED Final   Carbapenem resistance NDM NOT DETECTED NOT DETECTED Final   Carbapenem resist OXA 48 LIKE NOT DETECTED NOT DETECTED Final   Carbapenem resistance VIM NOT DETECTED NOT DETECTED Final    Comment: Performed at [facility], [address] [place], [place] [address]         Radiology Studies: DG Chest Port 1 View  Result Date: 05/26/2022 CLINICAL DATA:  Questionable sepsis. EXAM: PORTABLE CHEST 1 VIEW COMPARISON:  Portable chest 10/20/2020 FINDINGS: The heart size and mediastinal contours are stable with normal cardiac size and with aortic tortuosity and atherosclerosis. Both lungs are clear. The visualized skeletal structures are unremarkable. IMPRESSION: No active disease. Aortic atherosclerosis. Electronically Signed   By: Telford Nab M.D.   On: 05/26/2022 04:10        Scheduled Meds:  cycloSPORINE  1 drop Both Eyes BID   dorzolamide  1 drop Both Eyes BID   And   timolol  1 drop Both Eyes BID   finasteride  5 mg Oral q morning   ketorolac  1 drop Right Eye Daily   latanoprost  1 drop Both Eyes  QHS   Netarsudil Dimesylate  1 drop Both Eyes QHS   polyvinyl alcohol  1 drop Both Eyes QHS   simvastatin  40 mg Oral  QPM   tamsulosin  0.4 mg Oral QHS   Continuous Infusions:  cefTRIAXone (ROCEPHIN)  IV Stopped (05/27/22 0159)   vancomycin 1,250 mg (05/26/22 1641)     LOS: 1 day   Time spent: 35-minute's  Georgette Shell, MD  05/27/2022, 2:06 PM

## 2022-05-27 NOTE — Consult Note (Signed)
Urology Consult   Reason for consult: UTI, bacteremia s/p prostate procedure  History of Present Illness: Glenn Patel is a 89 y.o. who underwent an aqua-ablation procedure on his prostate 2/19. He presented to the ED yesterday c/o worsening weakness and temperatures at home. In ED was tachy and had low grade temp. UA was positive for LE, bacteria, RBC. Ucx pending, blood cultures were obtained and have grown enterrococcus. He is currently on vanc and rocephin  Today, he is feeling improved. He has been tolerating his foley without issue  Past Medical History:  Diagnosis Date   Arthritis    Cancer ([place])    skin cancer on nose   Chronic kidney disease    History of kidney stones    Hyperlipidemia    Hypertension    Urinary retention due to benign prostatic hyperplasia     Past Surgical History:  Procedure Laterality Date   EYE SURGERY Bilateral    cataract surgery w/ IOL   SKIN CANCER EXCISION     on nose   TONSILLECTOMY      Current Hospital Medications:  Home Meds:  No current facility-administered medications on file prior to encounter.   Current Outpatient Medications on File Prior to Encounter  Medication Sig Dispense Refill   bimatoprost (LUMIGAN) 0.01 % SOLN Place 1 drop into both eyes at bedtime.     cycloSPORINE (RESTASIS) 0.05 % ophthalmic emulsion Place 1 drop into both eyes 2 (two) times daily.     dorzolamide-timolol (COSOPT) 22.3-6.8 MG/ML ophthalmic solution Place 1 drop into both eyes 2 (two) times daily.  1   finasteride (PROSCAR) 5 MG tablet Take 5 mg by mouth every morning.     ketorolac (ACULAR) 0.5 % ophthalmic solution INSTILL 1 DROP INTO THE RIGHT EYE FOUR TIMES A DAY (Patient taking differently: Place 1 drop into the right eye daily.) 5 mL 5   oxyCODONE (OXY IR/ROXICODONE) 5 MG immediate release tablet Take 1 tablet (5 mg total) by mouth once as needed (for pain score of 1-4). 10 tablet 0   Polyethyl Glycol-Propyl Glycol (SYSTANE) 0.4-0.3 % GEL  ophthalmic gel Place 1 Application into both eyes at bedtime.     RHOPRESSA 0.02 % SOLN Place 1 drop into both eyes at bedtime.     simvastatin (ZOCOR) 40 MG tablet Take 40 mg by mouth every evening.     tamsulosin (FLOMAX) 0.4 MG CAPS capsule Take 0.4 mg by mouth at bedtime.     cephALEXin (KEFLEX) 500 MG capsule Take 1 capsule (500 mg total) by mouth 2 (two) times daily. (Patient not taking: Reported on 05/03/2022) 14 capsule 0     Scheduled Meds:  cycloSPORINE  1 drop Both Eyes BID   dorzolamide  1 drop Both Eyes BID   And   timolol  1 drop Both Eyes BID   finasteride  5 mg Oral q morning   ketorolac  1 drop Right Eye Daily   latanoprost  1 drop Both Eyes QHS   Netarsudil Dimesylate  1 drop Both Eyes QHS   polyvinyl alcohol  1 drop Both Eyes QHS   simvastatin  40 mg Oral QPM   tamsulosin  0.4 mg Oral QHS   Continuous Infusions:  cefTRIAXone (ROCEPHIN)  IV Stopped (05/27/22 0159)   vancomycin 1,250 mg (05/26/22 1641)   PRN Meds:.acetaminophen **OR** acetaminophen, ondansetron **OR** ondansetron (ZOFRAN) IV, oxyCODONE  Allergies: No Known Allergies  History reviewed. No pertinent family history.  Social History:  reports that he  has been smoking cigarettes. He has never used smokeless tobacco. He reports that he does not drink alcohol and does not use drugs.  ROS: A complete review of systems was performed.  All systems are negative except for pertinent findings as noted.  Physical Exam:  Vital signs in last 24 hours: Temp:  [98 F (36.7 C)-98.8 F (37.1 C)] 98.8 F (37.1 C) (02/25 0607) Pulse Rate:  [65-70] 70 (02/25 0607) Resp:  [15-17] 17 (02/25 0607) BP: (106-131)/(66-73) 131/72 (02/25 0607) SpO2:  [98 %-99 %] 98 % (02/25 0607) FiO2 (%):  [21 %] 21 % (02/24 2155) Constitutional:  Alert and oriented, No acute distress Cardiovascular: Regular rate and rhythm Respiratory: Normal respiratory effort, Lungs clear bilaterally GI: Abdomen is soft, nontender, nondistended,  no abdominal masses GU: foley draining clear yellow urine Neurologic: Grossly intact, no focal deficits Psychiatric: Normal mood and affect  Laboratory Data:  Recent Labs    05/26/22 0355 05/27/22 0333  WBC 11.1* 8.4  HGB 10.0* 8.6*  HCT 32.1* 27.0*  PLT 227 185    Recent Labs    05/26/22 0355 05/27/22 0333  NA 136 136  K 3.4* 4.0  CL 108 113*  GLUCOSE 166* 101*  BUN 12 11  CALCIUM 8.7* 8.0*  CREATININE 0.99 1.01     Results for orders placed or performed during the hospital encounter of 05/26/22 (from the past 24 hour(s))  Blood Culture ID Panel (Reflexed)     Status: Abnormal   Collection Time: 05/26/22 11:30 PM  Result Value Ref Range   Enterococcus faecalis NOT DETECTED NOT DETECTED   Enterococcus Faecium NOT DETECTED NOT DETECTED   Listeria monocytogenes NOT DETECTED NOT DETECTED   Staphylococcus species NOT DETECTED NOT DETECTED   Staphylococcus aureus (BCID) NOT DETECTED NOT DETECTED   Staphylococcus epidermidis NOT DETECTED NOT DETECTED   Staphylococcus lugdunensis NOT DETECTED NOT DETECTED   Streptococcus species NOT DETECTED NOT DETECTED   Streptococcus agalactiae NOT DETECTED NOT DETECTED   Streptococcus pneumoniae NOT DETECTED NOT DETECTED   Streptococcus pyogenes NOT DETECTED NOT DETECTED   A.calcoaceticus-baumannii NOT DETECTED NOT DETECTED   Bacteroides fragilis NOT DETECTED NOT DETECTED   Enterobacterales DETECTED (A) NOT DETECTED   Enterobacter cloacae complex NOT DETECTED NOT DETECTED   Escherichia coli NOT DETECTED NOT DETECTED   Klebsiella aerogenes NOT DETECTED NOT DETECTED   Klebsiella oxytoca NOT DETECTED NOT DETECTED   Klebsiella pneumoniae DETECTED (A) NOT DETECTED   Proteus species NOT DETECTED NOT DETECTED   Salmonella species NOT DETECTED NOT DETECTED   Serratia marcescens NOT DETECTED NOT DETECTED   Haemophilus influenzae NOT DETECTED NOT DETECTED   Neisseria meningitidis NOT DETECTED NOT DETECTED   Pseudomonas aeruginosa NOT  DETECTED NOT DETECTED   Stenotrophomonas maltophilia NOT DETECTED NOT DETECTED   Candida albicans NOT DETECTED NOT DETECTED   Candida auris NOT DETECTED NOT DETECTED   Candida glabrata NOT DETECTED NOT DETECTED   Candida krusei NOT DETECTED NOT DETECTED   Candida parapsilosis NOT DETECTED NOT DETECTED   Candida tropicalis NOT DETECTED NOT DETECTED   Cryptococcus neoformans/gattii NOT DETECTED NOT DETECTED   CTX-M ESBL NOT DETECTED NOT DETECTED   Carbapenem resistance IMP NOT DETECTED NOT DETECTED   Carbapenem resistance KPC NOT DETECTED NOT DETECTED   Carbapenem resistance NDM NOT DETECTED NOT DETECTED   Carbapenem resist OXA 48 LIKE NOT DETECTED NOT DETECTED   Carbapenem resistance VIM NOT DETECTED NOT DETECTED  CBC     Status: Abnormal   Collection Time: 05/27/22  3:33  AM  Result Value Ref Range   WBC 8.4 4.0 - 10.5 K/uL   RBC 3.39 (L) 4.22 - 5.81 MIL/uL   Hemoglobin 8.6 (L) 13.0 - 17.0 g/dL   HCT 27.0 (L) 39.0 - 52.0 %   MCV 79.6 (L) 80.0 - 100.0 fL   MCH 25.4 (L) 26.0 - 34.0 pg   MCHC 31.9 30.0 - 36.0 g/dL   RDW 15.9 (H) 11.5 - 15.5 %   Platelets 185 150 - 400 K/uL   nRBC 0.0 0.0 - 0.2 %  Comprehensive metabolic panel     Status: Abnormal   Collection Time: 05/27/22  3:33 AM  Result Value Ref Range   Sodium 136 135 - 145 mmol/L   Potassium 4.0 3.5 - 5.1 mmol/L   Chloride 113 (H) 98 - 111 mmol/L   CO2 19 (L) 22 - 32 mmol/L   Glucose, Bld 101 (H) 70 - 99 mg/dL   BUN 11 8 - 23 mg/dL   Creatinine, Ser 1.01 0.61 - 1.24 mg/dL   Calcium 8.0 (L) 8.9 - 10.3 mg/dL   Total Protein 5.8 (L) 6.5 - 8.1 g/dL   Albumin 2.7 (L) 3.5 - 5.0 g/dL   AST 13 (L) 15 - 41 U/L   ALT 14 0 - 44 U/L   Alkaline Phosphatase 55 38 - 126 U/L   Total Bilirubin 0.5 0.3 - 1.2 mg/dL   GFR, Estimated >60 >60 mL/min   Anion gap 4 (L) 5 - 15   Recent Results (from the past 240 hour(s))  Urine Culture     Status: None (Preliminary result)   Collection Time: 05/26/22  3:16 AM   Specimen: Urine, Random   Result Value Ref Range Status   Specimen Description   Final    URINE, RANDOM Performed at [facility], [place] [address], [place], [place] [address]    Special Requests   Final    URINE, CATHETERIZED Performed at [facility], [place] [address] [place], [place] [address]    Culture PENDING  Incomplete   Report Status PENDING  Incomplete  Blood Culture (routine x 2)     Status: None (Preliminary result)   Collection Time: 05/26/22  3:27 AM   Specimen: BLOOD  Result Value Ref Range Status   Specimen Description   Final    BLOOD RIGHT ANTECUBITAL Performed at [place] [address], [place], [place] [address]    Special Requests   Final    BOTTLES DRAWN AEROBIC AND ANAEROBIC Blood Culture adequate volume Performed at [place] [address], [place], [place] [address]    Culture  Setup Time   Final    GRAM POSITIVE COCCI AEROBIC BOTTLE ONLY GRAM NEGATIVE RODS ANAEROBIC BOTTLE ONLY CRITICAL RESULT CALLED TO, READ BACK BY AND VERIFIED WITH: PHARMD E. JACKSON 05/26/22 @ 2341 BY AB    Culture   Final    GRAM POSITIVE COCCI GRAM NEGATIVE RODS TOO YOUNG TO READ Performed at [facility], [place] [address] [place],  [address]    Report Status PENDING  Incomplete  Blood Culture ID Panel (Reflexed)     Status: Abnormal   Collection Time: 05/26/22  3:27 AM  Result Value Ref Range Status   Enterococcus faecalis DETECTED (A) NOT DETECTED Final    Comment: CRITICAL RESULT CALLED TO, READ BACK BY AND VERIFIED WITH: PHARMD E. JACKSON 05/26/22 @ 2341 BY AB    Enterococcus Faecium NOT DETECTED NOT  DETECTED Final   Listeria monocytogenes NOT DETECTED NOT DETECTED Final   Staphylococcus species NOT DETECTED NOT DETECTED Final   Staphylococcus aureus (BCID) NOT DETECTED NOT DETECTED Final   Staphylococcus epidermidis NOT DETECTED NOT DETECTED Final   Staphylococcus lugdunensis NOT DETECTED NOT DETECTED Final    Streptococcus species NOT DETECTED NOT DETECTED Final   Streptococcus agalactiae NOT DETECTED NOT DETECTED Final   Streptococcus pneumoniae NOT DETECTED NOT DETECTED Final   Streptococcus pyogenes NOT DETECTED NOT DETECTED Final   A.calcoaceticus-baumannii NOT DETECTED NOT DETECTED Final   Bacteroides fragilis NOT DETECTED NOT DETECTED Final   Enterobacterales NOT DETECTED NOT DETECTED Final   Enterobacter cloacae complex NOT DETECTED NOT DETECTED Final   Escherichia coli NOT DETECTED NOT DETECTED Final   Klebsiella aerogenes NOT DETECTED NOT DETECTED Final   Klebsiella oxytoca NOT DETECTED NOT DETECTED Final   Klebsiella pneumoniae NOT DETECTED NOT DETECTED Final   Proteus species NOT DETECTED NOT DETECTED Final   Salmonella species NOT DETECTED NOT DETECTED Final   Serratia marcescens NOT DETECTED NOT DETECTED Final   Haemophilus influenzae NOT DETECTED NOT DETECTED Final   Neisseria meningitidis NOT DETECTED NOT DETECTED Final   Pseudomonas aeruginosa NOT DETECTED NOT DETECTED Final   Stenotrophomonas maltophilia NOT DETECTED NOT DETECTED Final   Candida albicans NOT DETECTED NOT DETECTED Final   Candida auris NOT DETECTED NOT DETECTED Final   Candida glabrata NOT DETECTED NOT DETECTED Final   Candida krusei NOT DETECTED NOT DETECTED Final   Candida parapsilosis NOT DETECTED NOT DETECTED Final   Candida tropicalis NOT DETECTED NOT DETECTED Final   Cryptococcus neoformans/gattii NOT DETECTED NOT DETECTED Final   Vancomycin resistance NOT DETECTED NOT DETECTED Final    Comment: Performed at [facility], [address], [place], [place] [address]  Blood Culture (routine x 2)     Status: Abnormal (Preliminary result)   Collection Time: 05/26/22  3:33 AM   Specimen: BLOOD  Result Value Ref Range Status   Specimen Description   Final    BLOOD LEFT ANTECUBITAL Performed at [place] [address] [place], [place] [address]    Special Requests   Final     BOTTLES DRAWN AEROBIC AND ANAEROBIC Blood Culture adequate volume Performed at [place] [address] [place], [place] [address]    Culture  Setup Time   Final    GRAM POSITIVE COCCI IN BOTH AEROBIC AND ANAEROBIC BOTTLES CRITICAL VALUE NOTED.  VALUE IS CONSISTENT WITH PREVIOUSLY REPORTED AND CALLED VALUE.    Culture (A)  Final    ENTEROCOCCUS FAECALIS SUSCEPTIBILITIES TO FOLLOW Performed at [facility], [place] [address] [place], [place] [address]    Report Status PENDING  Incomplete  Resp panel by RT-PCR (RSV, Flu A&B, Covid) Anterior Nasal Swab     Status: None   Collection Time: 05/26/22  3:55 AM   Specimen: Anterior Nasal Swab  Result Value Ref Range Status   SARS Coronavirus 2 by RT PCR NEGATIVE NEGATIVE Final    Comment: (NOTE) SARS-CoV-2 target nucleic acids are NOT DETECTED.  The SARS-CoV-2 RNA is generally detectable in upper respiratory specimens during the acute phase of infection. The lowest concentration of SARS-CoV-2 viral copies this assay can detect is 138 copies/mL. A negative result does not preclude SARS-Cov-2 infection and should not be used as the sole basis for treatment or other patient management decisions. A negative result may occur with  improper specimen collection/handling, submission of specimen  other than nasopharyngeal swab, presence of viral mutation(s) within the areas targeted by this assay, and inadequate number of viral copies(<138 copies/mL). A negative result must be combined with clinical observations, patient history, and epidemiological information. The expected result is Negative.  Fact Sheet for Patients:  EntrepreneurPulse.com.au  Fact Sheet for Healthcare Providers:  IncredibleEmployment.be  This test is no t yet approved or cleared by the [place] FDA and  has been authorized for detection and/or diagnosis of SARS-CoV-2 by FDA under an Emergency Use  Authorization (EUA). This EUA will remain  in effect (meaning this test can be used) for the duration of the COVID-19 declaration under Section 564(b)(1) of the Act, 21 U.S.C.section 360bbb-3(b)(1), unless the authorization is terminated  or revoked sooner.       Influenza A by PCR NEGATIVE NEGATIVE Final   Influenza B by PCR NEGATIVE NEGATIVE Final    Comment: (NOTE) The Xpert Xpress SARS-CoV-2/FLU/RSV plus assay is intended as an aid in the diagnosis of influenza from Nasopharyngeal swab specimens and should not be used as a sole basis for treatment. Nasal washings and aspirates are unacceptable for Xpert Xpress SARS-CoV-2/FLU/RSV testing.  Fact Sheet for Patients: EntrepreneurPulse.com.au  Fact Sheet for Healthcare Providers: IncredibleEmployment.be  This test is not yet approved or cleared by the [place] FDA and has been authorized for detection and/or diagnosis of SARS-CoV-2 by FDA under an Emergency Use Authorization (EUA). This EUA will remain in effect (meaning this test can be used) for the duration of the COVID-19 declaration under Section 564(b)(1) of the Act, 21 U.S.C. section 360bbb-3(b)(1), unless the authorization is terminated or revoked.     Resp Syncytial Virus by PCR NEGATIVE NEGATIVE Final    Comment: (NOTE) Fact Sheet for Patients: EntrepreneurPulse.com.au  Fact Sheet for Healthcare Providers: IncredibleEmployment.be  This test is not yet approved or cleared by the [place] FDA and has been authorized for detection and/or diagnosis of SARS-CoV-2 by FDA under an Emergency Use Authorization (EUA). This EUA will remain in effect (meaning this test can be used) for the duration of the COVID-19 declaration under Section 564(b)(1) of the Act, 21 U.S.C. section 360bbb-3(b)(1), unless the authorization is terminated or revoked.  Performed at [facility],  [place] [address] [place], [place] [address]   Blood Culture ID Panel (Reflexed)     Status: Abnormal   Collection Time: 05/26/22 11:30 PM  Result Value Ref Range Status   Enterococcus faecalis NOT DETECTED NOT DETECTED Final   Enterococcus Faecium NOT DETECTED NOT DETECTED Final   Listeria monocytogenes NOT DETECTED NOT DETECTED Final   Staphylococcus species NOT DETECTED NOT DETECTED Final   Staphylococcus aureus (BCID) NOT DETECTED NOT DETECTED Final   Staphylococcus epidermidis NOT DETECTED NOT DETECTED Final   Staphylococcus lugdunensis NOT DETECTED NOT DETECTED Final   Streptococcus species NOT DETECTED NOT DETECTED Final   Streptococcus agalactiae NOT DETECTED NOT DETECTED Final   Streptococcus pneumoniae NOT DETECTED NOT DETECTED Final   Streptococcus pyogenes NOT DETECTED NOT DETECTED Final   A.calcoaceticus-baumannii NOT DETECTED NOT DETECTED Final   Bacteroides fragilis NOT DETECTED NOT DETECTED Final   Enterobacterales DETECTED (A) NOT DETECTED Final    Comment: Enterobacterales represent a large order of gram negative bacteria, not a single organism. CRITICAL RESULT CALLED TO, READ BACK BY AND VERIFIED WITH: PHARMD E. JACKSON 05/26/22 @ 2341 BY AB    Enterobacter cloacae complex NOT DETECTED NOT DETECTED Final   Escherichia coli NOT DETECTED NOT DETECTED Final   Klebsiella  aerogenes NOT DETECTED NOT DETECTED Final   Klebsiella oxytoca NOT DETECTED NOT DETECTED Final   Klebsiella pneumoniae DETECTED (A) NOT DETECTED Final    Comment: CRITICAL RESULT CALLED TO, READ BACK BY AND VERIFIED WITH: PHARMD E. JACKSON 05/26/22 @ 2341 BY AB    Proteus species NOT DETECTED NOT DETECTED Final   Salmonella species NOT DETECTED NOT DETECTED Final   Serratia marcescens NOT DETECTED NOT DETECTED Final   Haemophilus influenzae NOT DETECTED NOT DETECTED Final   Neisseria meningitidis NOT DETECTED NOT DETECTED Final   Pseudomonas aeruginosa NOT DETECTED NOT DETECTED Final    Stenotrophomonas maltophilia NOT DETECTED NOT DETECTED Final   Candida albicans NOT DETECTED NOT DETECTED Final   Candida auris NOT DETECTED NOT DETECTED Final   Candida glabrata NOT DETECTED NOT DETECTED Final   Candida krusei NOT DETECTED NOT DETECTED Final   Candida parapsilosis NOT DETECTED NOT DETECTED Final   Candida tropicalis NOT DETECTED NOT DETECTED Final   Cryptococcus neoformans/gattii NOT DETECTED NOT DETECTED Final   CTX-M ESBL NOT DETECTED NOT DETECTED Final   Carbapenem resistance IMP NOT DETECTED NOT DETECTED Final   Carbapenem resistance KPC NOT DETECTED NOT DETECTED Final   Carbapenem resistance NDM NOT DETECTED NOT DETECTED Final   Carbapenem resist OXA 48 LIKE NOT DETECTED NOT DETECTED Final   Carbapenem resistance VIM NOT DETECTED NOT DETECTED Final    Comment: Performed at [facility], [address], [place], [place] [address]    Renal Function: Recent Labs    05/26/22 0355 05/27/22 0333  CREATININE 0.99 1.01   Estimated Creatinine Clearance: 64.2 mL/min (by C-G formula based on SCr of 1.01 mg/dL).  Radiologic Imaging: DG Chest Port 1 View  Result Date: 05/26/2022 CLINICAL DATA:  Questionable sepsis. EXAM: PORTABLE CHEST 1 VIEW COMPARISON:  Portable chest 10/20/2020 FINDINGS: The heart size and mediastinal contours are stable with normal cardiac size and with aortic tortuosity and atherosclerosis. Both lungs are clear. The visualized skeletal structures are unremarkable. IMPRESSION: No active disease. Aortic atherosclerosis. Electronically Signed   By: Telford Nab M.D.   On: 05/26/2022 04:10    I independently reviewed the above imaging studies.  Impression/Recommendation 89 yo M with bactermia of likely GU source s/p prostate aqua ablation last week.   --OK to remove foley tomorrow (order written) --appreciate ID's involvement and recommendations regarding bacteremia   Donald Pore MD 05/27/2022, 1:08 PM  [facility]  Pager:  [phone]

## 2022-05-28 ENCOUNTER — Inpatient Hospital Stay (HOSPITAL_COMMUNITY): Payer: PPO

## 2022-05-28 DIAGNOSIS — A419 Sepsis, unspecified organism: Secondary | ICD-10-CM | POA: Diagnosis not present

## 2022-05-28 DIAGNOSIS — B952 Enterococcus as the cause of diseases classified elsewhere: Secondary | ICD-10-CM | POA: Diagnosis not present

## 2022-05-28 DIAGNOSIS — N390 Urinary tract infection, site not specified: Secondary | ICD-10-CM | POA: Diagnosis not present

## 2022-05-28 DIAGNOSIS — R7881 Bacteremia: Secondary | ICD-10-CM

## 2022-05-28 LAB — CULTURE, BLOOD (ROUTINE X 2): Special Requests: ADEQUATE

## 2022-05-28 LAB — ECHOCARDIOGRAM COMPLETE
Area-P 1/2: 2.69 cm2
Height: 72 in
S' Lateral: 3.4 cm
Weight: 3520 oz

## 2022-05-28 MED ORDER — SODIUM CHLORIDE 0.9 % IV SOLN
2.0000 g | INTRAVENOUS | Status: DC
  Administered 2022-05-28 – 2022-06-01 (×23): 2 g via INTRAVENOUS
  Filled 2022-05-28 (×29): qty 2000

## 2022-05-28 MED ORDER — PERFLUTREN LIPID MICROSPHERE
1.0000 mL | INTRAVENOUS | Status: AC | PRN
  Administered 2022-05-28: 2 mL via INTRAVENOUS

## 2022-05-28 NOTE — Progress Notes (Signed)
  Echocardiogram 2D Echocardiogram has been performed.  Glenn Patel 05/28/2022, 3:01 PM

## 2022-05-28 NOTE — Consult Note (Signed)
[facility]  Total days of antibiotics 4          ID PROGRESS NOTE Principal Problem:   UTI (urinary tract infection) Active Problems:   Hypokalemia   Hyperlipidemia   BPH (benign prostatic hyperplasia)   Glaucoma   Hypophosphatemia   24hr: feeling more alert, since admit. Tolerating TTE at bedside. Afebrile, still able to urinate despite having long standing foley catheter removed.    Allergies: No Known Allergies  MEDICATIONS:  Chlorhexidine Gluconate Cloth  6 each Topical Daily   cycloSPORINE  1 drop Both Eyes BID   dorzolamide  1 drop Both Eyes BID   And   timolol  1 drop Both Eyes BID   finasteride  5 mg Oral q morning   ketorolac  1 drop Right Eye Daily   latanoprost  1 drop Both Eyes QHS   Netarsudil Dimesylate  1 drop Both Eyes QHS   polyvinyl alcohol  1 drop Both Eyes QHS   simvastatin  40 mg Oral QPM   tamsulosin  0.4 mg Oral QHS     OBJECTIVE: Temp:  [97.5 F (36.4 C)-97.8 F (36.6 C)] 97.5 F (36.4 C) (02/26 1228) Pulse Rate:  [69-74] 72 (02/26 1228) Resp:  [17-18] 17 (02/26 1228) BP: (128-139)/(75-82) 139/82 (02/26 1228) SpO2:  [97 %-99 %] 99 % (02/26 1228) FiO2 (%):  [21 %] 21 % (02/25 2251) Physical Exam  Constitutional: He is oriented to person, place, and time. He appears well-developed and well-nourished. No distress.  HENT:  Mouth/Throat: Oropharynx is clear and moist. No oropharyngeal exudate.  Cardiovascular: Normal rate, regular rhythm and normal heart sounds. Exam reveals no gallop and no friction rub.  No murmur heard.  Pulmonary/Chest: Effort normal and breath sounds normal. No respiratory distress. He has no wheezes.  Abdominal: Soft. Bowel sounds are normal. He exhibits no distension. There is no tenderness.  Lymphadenopathy:  He has no cervical adenopathy.  Neurological: He is alert and oriented to person, place, and time.  Skin: Skin is warm and dry. No rash noted. No erythema.  Psychiatric: He has a  normal mood and affect. His behavior is normal.    LABS: Results for orders placed or performed during the hospital encounter of 05/26/22 (from the past 48 hour(s))  Blood Culture ID Panel (Reflexed)     Status: Abnormal   Collection Time: 05/26/22 11:30 PM  Result Value Ref Range   Enterococcus faecalis NOT DETECTED NOT DETECTED   Enterococcus Faecium NOT DETECTED NOT DETECTED   Listeria monocytogenes NOT DETECTED NOT DETECTED   Staphylococcus species NOT DETECTED NOT DETECTED   Staphylococcus aureus (BCID) NOT DETECTED NOT DETECTED   Staphylococcus epidermidis NOT DETECTED NOT DETECTED   Staphylococcus lugdunensis NOT DETECTED NOT DETECTED   Streptococcus species NOT DETECTED NOT DETECTED   Streptococcus agalactiae NOT DETECTED NOT DETECTED   Streptococcus pneumoniae NOT DETECTED NOT DETECTED   Streptococcus pyogenes NOT DETECTED NOT DETECTED   A.calcoaceticus-baumannii NOT DETECTED NOT DETECTED   Bacteroides fragilis NOT DETECTED NOT DETECTED   Enterobacterales DETECTED (A) NOT DETECTED    Comment: Enterobacterales represent a large order of gram negative bacteria, not a single organism. CRITICAL RESULT CALLED TO, READ BACK BY AND VERIFIED WITH: PHARMD E. JACKSON 05/26/22 @ 2341 BY AB    Enterobacter cloacae complex NOT DETECTED NOT DETECTED   Escherichia coli NOT DETECTED NOT DETECTED   Klebsiella aerogenes NOT DETECTED NOT DETECTED   Klebsiella oxytoca NOT DETECTED NOT DETECTED  Klebsiella pneumoniae DETECTED (A) NOT DETECTED    Comment: CRITICAL RESULT CALLED TO, READ BACK BY AND VERIFIED WITH: PHARMD E. JACKSON 05/26/22 @ 2341 BY AB    Proteus species NOT DETECTED NOT DETECTED   Salmonella species NOT DETECTED NOT DETECTED   Serratia marcescens NOT DETECTED NOT DETECTED   Haemophilus influenzae NOT DETECTED NOT DETECTED   Neisseria meningitidis NOT DETECTED NOT DETECTED   Pseudomonas aeruginosa NOT DETECTED NOT DETECTED   Stenotrophomonas maltophilia NOT DETECTED NOT  DETECTED   Candida albicans NOT DETECTED NOT DETECTED   Candida auris NOT DETECTED NOT DETECTED   Candida glabrata NOT DETECTED NOT DETECTED   Candida krusei NOT DETECTED NOT DETECTED   Candida parapsilosis NOT DETECTED NOT DETECTED   Candida tropicalis NOT DETECTED NOT DETECTED   Cryptococcus neoformans/gattii NOT DETECTED NOT DETECTED   CTX-M ESBL NOT DETECTED NOT DETECTED   Carbapenem resistance IMP NOT DETECTED NOT DETECTED   Carbapenem resistance KPC NOT DETECTED NOT DETECTED   Carbapenem resistance NDM NOT DETECTED NOT DETECTED   Carbapenem resist OXA 48 LIKE NOT DETECTED NOT DETECTED   Carbapenem resistance VIM NOT DETECTED NOT DETECTED    Comment: Performed at [facility] [address], [place], [place] [address]  CBC     Status: Abnormal   Collection Time: 05/27/22  3:33 AM  Result Value Ref Range   WBC 8.4 4.0 - 10.5 K/uL   RBC 3.39 (L) 4.22 - 5.81 MIL/uL   Hemoglobin 8.6 (L) 13.0 - 17.0 g/dL   HCT 27.0 (L) 39.0 - 52.0 %   MCV 79.6 (L) 80.0 - 100.0 fL   MCH 25.4 (L) 26.0 - 34.0 pg   MCHC 31.9 30.0 - 36.0 g/dL   RDW 15.9 (H) 11.5 - 15.5 %   Platelets 185 150 - 400 K/uL   nRBC 0.0 0.0 - 0.2 %    Comment: Performed at [facility], [place] [address], [place], [place] [address]  Comprehensive metabolic panel     Status: Abnormal   Collection Time: 05/27/22  3:33 AM  Result Value Ref Range   Sodium 136 135 - 145 mmol/L   Potassium 4.0 3.5 - 5.1 mmol/L   Chloride 113 (H) 98 - 111 mmol/L   CO2 19 (L) 22 - 32 mmol/L   Glucose, Bld 101 (H) 70 - 99 mg/dL    Comment: Glucose reference range applies only to samples taken after fasting for at least 8 hours.   BUN 11 8 - 23 mg/dL   Creatinine, Ser 1.01 0.61 - 1.24 mg/dL   Calcium 8.0 (L) 8.9 - 10.3 mg/dL   Total Protein 5.8 (L) 6.5 - 8.1 g/dL   Albumin 2.7 (L) 3.5 - 5.0 g/dL   AST 13 (L) 15 - 41 U/L   ALT 14 0 - 44 U/L   Alkaline Phosphatase 55 38 - 126 U/L   Total Bilirubin 0.5 0.3 - 1.2 mg/dL    GFR, Estimated >60 >60 mL/min    Comment: (NOTE) Calculated using the CKD-EPI Creatinine Equation (2021)    Anion gap 4 (L) 5 - 15    Comment: Performed at [facility], [place] [address], [place], [place] [address]  Culture, blood (Routine X 2) w Reflex to ID Panel     Status: None (Preliminary result)   Collection Time: 05/27/22  1:35 PM   Specimen: BLOOD  Result Value Ref Range   Specimen Description      BLOOD SITE NOT SPECIFIED  Performed at [facility], [place] [address] [place], [place] [address]    Special Requests      BOTTLES DRAWN AEROBIC AND ANAEROBIC Blood Culture adequate volume Performed at [facility] [address] [place], [place] [address]    Culture      NO GROWTH < 24 HOURS Performed at [facility] [address] [place], [place] [address]    Report Status PENDING   Culture, blood (Routine X 2) w Reflex to ID Panel     Status: None (Preliminary result)   Collection Time: 05/27/22  3:46 PM   Specimen: BLOOD  Result Value Ref Range   Specimen Description      BLOOD SITE NOT SPECIFIED Performed at [facility] [address], [place], [place] [address]    Special Requests      BOTTLES DRAWN AEROBIC ONLY Blood Culture adequate volume Performed at [facility] [address], [place], [place] [address]    Culture      NO GROWTH < 24 HOURS Performed at [facility] [address] [place], [place] [address]    Report Status PENDING     MICRO: 2/25 blood cx ngtd 2/24 blood cx  amp S enterococcus ( 2 sets) 2/24 blood cx klebsiella ( 1 of 4 bottles) IMAGING: No results found.  HISTORICAL MICRO/IMAGING  Assessment/Plan:  polymicrobial bacteremia of urinary origin - recommend to change to ampicillin plus ceftriraxone to cover enterococcal and klebsiella infectoin  - please do not place picc line unless we have at minimum 48hrs of repeat blood  cx NGTD - still may need TEE despite TTE findings

## 2022-05-28 NOTE — Progress Notes (Signed)
PROGRESS NOTE    Glenn Patel  [id number] DOB: 1933-08-24 DOA: 05/26/2022 PCP: Lujean Amel, MD   Brief Narrative:  Glenn Patel is a 89 y.o. male with medical history significant of osteoarthritis, skin cancer, chronic kidney disease, nephrolithiasis, hyperlipidemia, urinary retention, BPH, Foley catheter insertion for the last 3 months who underwent a robotic water jet ablation of the prostate who is coming to the emergency department with complaints of progressively worse weakness over the last few days, dizziness and fever since yesterday. He denied fever, chills, rhinorrhea, sore throat, wheezing or hemoptysis.  No chest pain, palpitations, diaphoresis, PND, orthopnea or pitting edema of the lower extremities.  No abdominal pain, nausea, emesis, diarrhea, constipation, melena or hematochezia.  No flank pain, dysuria, frequency or hematuria.  No polyuria, polydipsia, polyphagia or blurred vision.    ED course: Initial vital signs were temperature 100 F, pulse 100, respiration 11, BP 141/59 mmHg O2 sat 98% on room air.  Patient received acetaminophen 650 mg IVP, Normal Saline 500 mL bolus x 2, Zosyn and vancomycin IVPB.   Lab work: Urinalysis was cloudy with large hemoglobinuria large leukocyte esterase and proteinuria 100 mg/dL.  There were more than 50 RBC, more than 50 WBC, many bacteria and positive WBC clumps on microscopic examination.  CBC showed a white count of 11.1, hemoglobin 10.0 g/dL platelets 227.  Normal PT, INR and PTT.  Coronavirus influenza and RSV PCR negative.  Lactic acid x 2 normal.  CMP showed a potassium of 3.4 and CO2 of 20 mmol/L with an anion gap of 8.  Glucose 166 mg/dL and albumin 3.4 g/dL.  The rest of the CMP measurements were normal after calcium correction.  Assessment & Plan:   Principal Problem:   UTI (urinary tract infection) Active Problems:   Hypokalemia   Hyperlipidemia   BPH (benign prostatic hyperplasia)   Glaucoma   Hypophosphatemia   #1  enterococcal faecalis/Klebsiella bacteremia secondary to urinary tract infection status post recent aqua ablation to the prostate 2/19 Transthoracic echo today ID following Received vancomycin and cefepime initially ID started ceftriaxone Repeat blood cultures from 05/27/2022 no growth On admission he was tachycardic febrile with leukocytosis  #2 BPH Foley in place status post aqua ablation of the prostate seen by urology  removed Foley catheter 05/28/2022 Continue Proscar and Flomax  #3 hyperlipidemia on statin  #4 hypokalemia/hypophosphatemia resolved  #5 glaucoma continue eyedrops     Estimated body mass index is 29.84 kg/m as calculated from the following:   Height as of this encounter: 6' (1.829 m).   Weight as of this encounter: 99.8 kg.  DVT prophylaxis: Lovenox Code Status: Full code Family Communication: Discussed with wife at bedside disposition Plan:  Status is: Inpatient Remains inpatient appropriate because: Bacteremia needing IV antibiotics   Consultants:  Infectious disease and urology  Procedures: None  antimicrobials: Vancomycin and cefepime  Subjective:  Foley removed this morning Urinating without issues after removal of Foley No events overnight Anxious to go home  Objective: Vitals:   05/27/22 0607 05/27/22 1351 05/27/22 2027 05/28/22 0601  BP: 131/72 (!) 149/75 128/75 135/82  Pulse: 70 78 74 69  Resp: 17   18  Temp: 98.8 F (37.1 C) 98.6 F (37 C) 97.8 F (36.6 C) (!) 97.5 F (36.4 C)  TempSrc: Oral Oral Oral Oral  SpO2: 98% 100% 98% 97%  Weight:      Height:        Intake/Output Summary (Last 24 hours) at 05/28/2022 0941 Last  data filed at 05/28/2022 0800 Gross per 24 hour  Intake 1070.05 ml  Output 4450 ml  Net -3379.95 ml    Filed Weights   05/26/22 0320  Weight: 99.8 kg    Examination: Foley removed 05/28/2022 General exam: Appears in no acute distress Respiratory system: Clear to auscultation. Respiratory effort  normal. Cardiovascular system: S1 & S2 heard, RRR. No JVD, murmurs, rubs, gallops or clicks. No pedal edema. Gastrointestinal system: Abdomen is nondistended, soft and nontender. No organomegaly or masses felt. Normal bowel sounds heard. Central nervous system: Alert and oriented. No focal neurological deficits. Extremities: Trace edema Skin: No rashes, lesions or ulcers Psychiatry: Judgement and insight appear normal. Mood & affect appropriate.     Data Reviewed: I have personally reviewed following labs and imaging studies  CBC: Recent Labs  Lab 05/22/22 0540 05/26/22 0355 05/27/22 0333  WBC  --  11.1* 8.4  NEUTROABS  --  9.6*  --   HGB 10.2* 10.0* 8.6*  HCT 33.2* 32.1* 27.0*  MCV  --  79.7* 79.6*  PLT  --  227 [id number]    Basic Metabolic Panel: Recent Labs  Lab 05/26/22 0355 05/27/22 0333  NA 136 136  K 3.4* 4.0  CL 108 113*  CO2 20* 19*  GLUCOSE 166* 101*  BUN 12 11  CREATININE 0.99 1.01  CALCIUM 8.7* 8.0*  MG 1.9  --   PHOS 1.3*  --     GFR: Estimated Creatinine Clearance: 64.2 mL/min (by C-G formula based on SCr of 1.01 mg/dL). Liver Function Tests: Recent Labs  Lab 05/26/22 0355 05/27/22 0333  AST 18 13*  ALT 15 14  ALKPHOS 75 55  BILITOT 0.9 0.5  PROT 6.7 5.8*  ALBUMIN 3.4* 2.7*    No results for input(s): "LIPASE", "AMYLASE" in the last 168 hours. No results for input(s): "AMMONIA" in the last 168 hours. Coagulation Profile: Recent Labs  Lab 05/26/22 0355  INR 1.2    Cardiac Enzymes: No results for input(s): "CKTOTAL", "CKMB", "CKMBINDEX", "TROPONINI" in the last 168 hours. BNP (last 3 results) No results for input(s): "PROBNP" in the last 8760 hours. HbA1C: No results for input(s): "HGBA1C" in the last 72 hours. CBG: No results for input(s): "GLUCAP" in the last 168 hours. Lipid Profile: No results for input(s): "CHOL", "HDL", "LDLCALC", "TRIG", "CHOLHDL", "LDLDIRECT" in the last 72 hours. Thyroid Function Tests: No results for  input(s): "TSH", "T4TOTAL", "FREET4", "T3FREE", "THYROIDAB" in the last 72 hours. Anemia Panel: No results for input(s): "VITAMINB12", "FOLATE", "FERRITIN", "TIBC", "IRON", "RETICCTPCT" in the last 72 hours. Sepsis Labs: Recent Labs  Lab 05/26/22 0327 05/26/22 0505  LATICACIDVEN 1.5 1.8     Recent Results (from the past 240 hour(s))  Urine Culture     Status: Abnormal   Collection Time: 05/26/22  3:16 AM   Specimen: Urine, Random  Result Value Ref Range Status   Specimen Description   Final    URINE, RANDOM Performed at [facility] [address], [place], [place] [address]    Special Requests   Final    URINE, CATHETERIZED Performed at [facility], [place] [address] [place], [place] [address]    Culture MULTIPLE SPECIES PRESENT, SUGGEST RECOLLECTION (A)  Final   Report Status 05/27/2022 FINAL  Final  Blood Culture (routine x 2)     Status: Abnormal (Preliminary result)   Collection Time: 05/26/22  3:27 AM   Specimen: BLOOD  Result Value Ref Range Status   Specimen Description  Final    BLOOD RIGHT ANTECUBITAL Performed at [facility] [address] [place], [place] [address]    Special Requests   Final    BOTTLES DRAWN AEROBIC AND ANAEROBIC Blood Culture adequate volume Performed at [facility] [address], [place], [place] [address]    Culture  Setup Time   Final    GRAM POSITIVE COCCI AEROBIC BOTTLE ONLY GRAM NEGATIVE RODS ANAEROBIC BOTTLE ONLY CRITICAL RESULT CALLED TO, READ BACK BY AND VERIFIED WITH: [place] 05/26/22 @ 2341 BY AB    Culture (A)  Final    ENTEROCOCCUS FAECALIS KLEBSIELLA PNEUMONIAE SUSCEPTIBILITIES TO FOLLOW Performed at [facility], [place] [address], [place], [place] [address]    Report Status PENDING  Incomplete  Blood Culture ID Panel (Reflexed)     Status: Abnormal   Collection Time: 05/26/22  3:27 AM  Result Value Ref Range Status   Enterococcus  faecalis DETECTED (A) NOT DETECTED Final    Comment: CRITICAL RESULT CALLED TO, READ BACK BY AND VERIFIED WITH: PHARMD E. JACKSON 05/26/22 @ 2341 BY AB    Enterococcus Faecium NOT DETECTED NOT DETECTED Final   Listeria monocytogenes NOT DETECTED NOT DETECTED Final   Staphylococcus species NOT DETECTED NOT DETECTED Final   Staphylococcus aureus (BCID) NOT DETECTED NOT DETECTED Final   Staphylococcus epidermidis NOT DETECTED NOT DETECTED Final   Staphylococcus lugdunensis NOT DETECTED NOT DETECTED Final   Streptococcus species NOT DETECTED NOT DETECTED Final   Streptococcus agalactiae NOT DETECTED NOT DETECTED Final   Streptococcus pneumoniae NOT DETECTED NOT DETECTED Final   Streptococcus pyogenes NOT DETECTED NOT DETECTED Final   A.calcoaceticus-baumannii NOT DETECTED NOT DETECTED Final   Bacteroides fragilis NOT DETECTED NOT DETECTED Final   Enterobacterales NOT DETECTED NOT DETECTED Final   Enterobacter cloacae complex NOT DETECTED NOT DETECTED Final   Escherichia coli NOT DETECTED NOT DETECTED Final   Klebsiella aerogenes NOT DETECTED NOT DETECTED Final   Klebsiella oxytoca NOT DETECTED NOT DETECTED Final   Klebsiella pneumoniae NOT DETECTED NOT DETECTED Final   Proteus species NOT DETECTED NOT DETECTED Final   Salmonella species NOT DETECTED NOT DETECTED Final   Serratia marcescens NOT DETECTED NOT DETECTED Final   Haemophilus influenzae NOT DETECTED NOT DETECTED Final   Neisseria meningitidis NOT DETECTED NOT DETECTED Final   Pseudomonas aeruginosa NOT DETECTED NOT DETECTED Final   Stenotrophomonas maltophilia NOT DETECTED NOT DETECTED Final   Candida albicans NOT DETECTED NOT DETECTED Final   Candida auris NOT DETECTED NOT DETECTED Final   Candida glabrata NOT DETECTED NOT DETECTED Final   Candida krusei NOT DETECTED NOT DETECTED Final   Candida parapsilosis NOT DETECTED NOT DETECTED Final   Candida tropicalis NOT DETECTED NOT DETECTED Final   Cryptococcus neoformans/gattii  NOT DETECTED NOT DETECTED Final   Vancomycin resistance NOT DETECTED NOT DETECTED Final    Comment: Performed at [facility], [address], [place], [place] [address]  Blood Culture (routine x 2)     Status: Abnormal   Collection Time: 05/26/22  3:33 AM   Specimen: BLOOD  Result Value Ref Range Status   Specimen Description   Final    BLOOD LEFT ANTECUBITAL Performed at [facility] [address], [place], [place] [address]    Special Requests   Final    BOTTLES DRAWN AEROBIC AND ANAEROBIC Blood Culture adequate volume Performed at [facility] [address] [place], [place] [address]  Culture  Setup Time   Final    GRAM POSITIVE COCCI IN BOTH AEROBIC AND ANAEROBIC BOTTLES CRITICAL VALUE NOTED.  VALUE IS CONSISTENT WITH PREVIOUSLY REPORTED AND CALLED VALUE. Performed at [facility], [place] [address] [place],  [address]    Culture ENTEROCOCCUS FAECALIS (A)  Final   Report Status 05/28/2022 FINAL  Final   Organism ID, Bacteria ENTEROCOCCUS FAECALIS  Final      Susceptibility   Enterococcus faecalis - MIC*    AMPICILLIN <=2 SENSITIVE Sensitive     VANCOMYCIN 1 SENSITIVE Sensitive     GENTAMICIN SYNERGY SENSITIVE Sensitive     * ENTEROCOCCUS FAECALIS  Resp panel by RT-PCR (RSV, Flu A&B, Covid) Anterior Nasal Swab     Status: None   Collection Time: 05/26/22  3:55 AM   Specimen: Anterior Nasal Swab  Result Value Ref Range Status   SARS Coronavirus 2 by RT PCR NEGATIVE NEGATIVE Final    Comment: (NOTE) SARS-CoV-2 target nucleic acids are NOT DETECTED.  The SARS-CoV-2 RNA is generally detectable in upper respiratory specimens during the acute phase of infection. The lowest concentration of SARS-CoV-2 viral copies this assay can detect is 138 copies/mL. A negative result does not preclude SARS-Cov-2 infection and should not be used as the sole basis for treatment or other patient management decisions. A negative  result may occur with  improper specimen collection/handling, submission of specimen other than nasopharyngeal swab, presence of viral mutation(s) within the areas targeted by this assay, and inadequate number of viral copies(<138 copies/mL). A negative result must be combined with clinical observations, patient history, and epidemiological information. The expected result is Negative.  Fact Sheet for Patients:  EntrepreneurPulse.com.au  Fact Sheet for Healthcare Providers:  IncredibleEmployment.be  This test is no t yet approved or cleared by the [place] FDA and  has been authorized for detection and/or diagnosis of SARS-CoV-2 by FDA under an Emergency Use Authorization (EUA). This EUA will remain  in effect (meaning this test can be used) for the duration of the COVID-19 declaration under Section 564(b)(1) of the Act, 21 U.S.C.section 360bbb-3(b)(1), unless the authorization is terminated  or revoked sooner.       Influenza A by PCR NEGATIVE NEGATIVE Final   Influenza B by PCR NEGATIVE NEGATIVE Final    Comment: (NOTE) The Xpert Xpress SARS-CoV-2/FLU/RSV plus assay is intended as an aid in the diagnosis of influenza from Nasopharyngeal swab specimens and should not be used as a sole basis for treatment. Nasal washings and aspirates are unacceptable for Xpert Xpress SARS-CoV-2/FLU/RSV testing.  Fact Sheet for Patients: EntrepreneurPulse.com.au  Fact Sheet for Healthcare Providers: IncredibleEmployment.be  This test is not yet approved or cleared by the [place] FDA and has been authorized for detection and/or diagnosis of SARS-CoV-2 by FDA under an Emergency Use Authorization (EUA). This EUA will remain in effect (meaning this test can be used) for the duration of the COVID-19 declaration under Section 564(b)(1) of the Act, 21 U.S.C. section 360bbb-3(b)(1), unless the authorization is  terminated or revoked.     Resp Syncytial Virus by PCR NEGATIVE NEGATIVE Final    Comment: (NOTE) Fact Sheet for Patients: EntrepreneurPulse.com.au  Fact Sheet for Healthcare Providers: IncredibleEmployment.be  This test is not yet approved or cleared by the [place] FDA and has been authorized for detection and/or diagnosis of SARS-CoV-2 by FDA under an Emergency Use Authorization (EUA). This EUA will remain in effect (meaning this test can be used) for the duration of the  COVID-19 declaration under Section 564(b)(1) of the Act, 21 U.S.C. section 360bbb-3(b)(1), unless the authorization is terminated or revoked.  Performed at [facility], [place] [address] [place], [place] [address]   Blood Culture ID Panel (Reflexed)     Status: Abnormal   Collection Time: 05/26/22 11:30 PM  Result Value Ref Range Status   Enterococcus faecalis NOT DETECTED NOT DETECTED Final   Enterococcus Faecium NOT DETECTED NOT DETECTED Final   Listeria monocytogenes NOT DETECTED NOT DETECTED Final   Staphylococcus species NOT DETECTED NOT DETECTED Final   Staphylococcus aureus (BCID) NOT DETECTED NOT DETECTED Final   Staphylococcus epidermidis NOT DETECTED NOT DETECTED Final   Staphylococcus lugdunensis NOT DETECTED NOT DETECTED Final   Streptococcus species NOT DETECTED NOT DETECTED Final   Streptococcus agalactiae NOT DETECTED NOT DETECTED Final   Streptococcus pneumoniae NOT DETECTED NOT DETECTED Final   Streptococcus pyogenes NOT DETECTED NOT DETECTED Final   A.calcoaceticus-baumannii NOT DETECTED NOT DETECTED Final   Bacteroides fragilis NOT DETECTED NOT DETECTED Final   Enterobacterales DETECTED (A) NOT DETECTED Final    Comment: Enterobacterales represent a large order of gram negative bacteria, not a single organism. CRITICAL RESULT CALLED TO, READ BACK BY AND VERIFIED WITH: PHARMD E. JACKSON 05/26/22 @ 2341 BY AB    Enterobacter  cloacae complex NOT DETECTED NOT DETECTED Final   Escherichia coli NOT DETECTED NOT DETECTED Final   Klebsiella aerogenes NOT DETECTED NOT DETECTED Final   Klebsiella oxytoca NOT DETECTED NOT DETECTED Final   Klebsiella pneumoniae DETECTED (A) NOT DETECTED Final    Comment: CRITICAL RESULT CALLED TO, READ BACK BY AND VERIFIED WITH: PHARMD E. JACKSON 05/26/22 @ 2341 BY AB    Proteus species NOT DETECTED NOT DETECTED Final   Salmonella species NOT DETECTED NOT DETECTED Final   Serratia marcescens NOT DETECTED NOT DETECTED Final   Haemophilus influenzae NOT DETECTED NOT DETECTED Final   Neisseria meningitidis NOT DETECTED NOT DETECTED Final   Pseudomonas aeruginosa NOT DETECTED NOT DETECTED Final   Stenotrophomonas maltophilia NOT DETECTED NOT DETECTED Final   Candida albicans NOT DETECTED NOT DETECTED Final   Candida auris NOT DETECTED NOT DETECTED Final   Candida glabrata NOT DETECTED NOT DETECTED Final   Candida krusei NOT DETECTED NOT DETECTED Final   Candida parapsilosis NOT DETECTED NOT DETECTED Final   Candida tropicalis NOT DETECTED NOT DETECTED Final   Cryptococcus neoformans/gattii NOT DETECTED NOT DETECTED Final   CTX-M ESBL NOT DETECTED NOT DETECTED Final   Carbapenem resistance IMP NOT DETECTED NOT DETECTED Final   Carbapenem resistance KPC NOT DETECTED NOT DETECTED Final   Carbapenem resistance NDM NOT DETECTED NOT DETECTED Final   Carbapenem resist OXA 48 LIKE NOT DETECTED NOT DETECTED Final   Carbapenem resistance VIM NOT DETECTED NOT DETECTED Final    Comment: Performed at [facility], [address] [place], [place] [address]         Radiology Studies: No results found.      Scheduled Meds:  Chlorhexidine Gluconate Cloth  6 each Topical Daily   cycloSPORINE  1 drop Both Eyes BID   dorzolamide  1 drop Both Eyes BID   And   timolol  1 drop Both Eyes BID   finasteride  5 mg Oral q morning   ketorolac  1 drop Right Eye Daily   latanoprost  1 drop  Both Eyes QHS   Netarsudil Dimesylate  1 drop Both Eyes QHS   polyvinyl alcohol  1 drop Both Eyes  QHS   simvastatin  40 mg Oral QPM   tamsulosin  0.4 mg Oral QHS   Continuous Infusions:  cefTRIAXone (ROCEPHIN)  IV Stopped (05/28/22 0045)   vancomycin 1,250 mg (05/27/22 1624)     LOS: 2 days   Time spent: 30 minutes Georgette Shell, MD  05/28/2022, 9:41 AM

## 2022-05-29 DIAGNOSIS — A419 Sepsis, unspecified organism: Secondary | ICD-10-CM | POA: Diagnosis not present

## 2022-05-29 LAB — CULTURE, BLOOD (ROUTINE X 2): Special Requests: ADEQUATE

## 2022-05-29 MED ORDER — MELATONIN 3 MG PO TABS
3.0000 mg | ORAL_TABLET | Freq: Every evening | ORAL | Status: AC | PRN
  Administered 2022-05-29: 3 mg via ORAL
  Filled 2022-05-29: qty 1

## 2022-05-29 NOTE — Progress Notes (Signed)
PROGRESS NOTE    Glenn Patel  [id number] DOB: Aug 06, 1933 DOA: 05/26/2022 PCP: Lujean Amel, MD   Brief Narrative:  Glenn Patel is a 89 y.o. male with medical history significant of osteoarthritis, skin cancer, chronic kidney disease, nephrolithiasis, hyperlipidemia, urinary retention, BPH, Foley catheter insertion for the last 3 months who underwent a robotic water jet ablation of the prostate who is coming to the emergency department with complaints of progressively worse weakness over the last few days, dizziness and fever since yesterday. He denied fever, chills, rhinorrhea, sore throat, wheezing or hemoptysis.  No chest pain, palpitations, diaphoresis, PND, orthopnea or pitting edema of the lower extremities.  No abdominal pain, nausea, emesis, diarrhea, constipation, melena or hematochezia.  No flank pain, dysuria, frequency or hematuria.  No polyuria, polydipsia, polyphagia or blurred vision.    ED course: Initial vital signs were temperature 100 F, pulse 100, respiration 11, BP 141/59 mmHg O2 sat 98% on room air.  Patient received acetaminophen 650 mg IVP, Normal Saline 500 mL bolus x 2, Zosyn and vancomycin IVPB.   Lab work: Urinalysis was cloudy with large hemoglobinuria large leukocyte esterase and proteinuria 100 mg/dL.  There were more than 50 RBC, more than 50 WBC, many bacteria and positive WBC clumps on microscopic examination.  CBC showed a white count of 11.1, hemoglobin 10.0 g/dL platelets 227.  Normal PT, INR and PTT.  Coronavirus influenza and RSV PCR negative.  Lactic acid x 2 normal.  CMP showed a potassium of 3.4 and CO2 of 20 mmol/L with an anion gap of 8.  Glucose 166 mg/dL and albumin 3.4 g/dL.  The rest of the CMP measurements were normal after calcium correction.  Assessment & Plan:   Principal Problem:   UTI (urinary tract infection) Active Problems:   Hypokalemia   Hyperlipidemia   BPH (benign prostatic hyperplasia)   Glaucoma   Hypophosphatemia   #1  enterococcal faecalis/Klebsiella bacteremia secondary to urinary tract infection status post recent aqua ablation to the prostate 2/19 Transthoracic echo -no evidence of vegetation Transesophageal echo scheduled for Friday ID following On ampicillin and Rocephin. Repeat blood cultures from 05/27/2022 no growth He will need a PICC line pending clearance from ID  #2 BPH Foley in place status post aqua ablation of the prostate seen by urology  removed Foley catheter 05/28/2022 Continue Proscar and Flomax  #3 hyperlipidemia on statin  #4 hypokalemia/hypophosphatemia resolved  #5 glaucoma continue eyedrops     Estimated body mass index is 29.84 kg/m as calculated from the following:   Height as of this encounter: 6' (1.829 m).   Weight as of this encounter: 99.8 kg.  DVT prophylaxis: Lovenox Code Status: Full code Family Communication: Discussed with wife at bedside disposition Plan:  Status is: Inpatient Remains inpatient appropriate because: Bacteremia needing IV antibiotics   Consultants:  Infectious disease and urology  Procedures: None  antimicrobials: Vancomycin and cefepime  Subjective: Anxious to go home No complaints overnight  Objective: Vitals:   05/28/22 1944 05/28/22 2317 05/29/22 0514 05/29/22 1144  BP: 137/82  132/83 135/77  Pulse: 73 74 68 70  Resp: '18 16 18 18  '$ Temp: 97.8 F (36.6 C)  97.6 F (36.4 C) 98 F (36.7 C)  TempSrc: Oral  Oral Oral  SpO2: 97% 97% 98% 98%  Weight:      Height:        Intake/Output Summary (Last 24 hours) at 05/29/2022 1507 Last data filed at 05/29/2022 0655 Gross per 24 hour  Intake  733.9 ml  Output 1275 ml  Net -541.1 ml    Filed Weights   05/26/22 0320  Weight: 99.8 kg    Examination: Foley removed 05/28/2022 General exam: Appears in no acute distress Respiratory system: Clear to auscultation. Respiratory effort normal. Cardiovascular system: S1 & S2 heard, RRR. No JVD, murmurs, rubs, gallops or clicks. No  pedal edema. Gastrointestinal system: Abdomen is nondistended, soft and nontender. No organomegaly or masses felt. Normal bowel sounds heard. Central nervous system: Alert and oriented. No focal neurological deficits. Extremities: Trace edema Skin: No rashes, lesions or ulcers Psychiatry: Judgement and insight appear normal. Mood & affect appropriate.     Data Reviewed: I have personally reviewed following labs and imaging studies  CBC: Recent Labs  Lab 05/26/22 0355 05/27/22 0333  WBC 11.1* 8.4  NEUTROABS 9.6*  --   HGB 10.0* 8.6*  HCT 32.1* 27.0*  MCV 79.7* 79.6*  PLT 227 [id number]    Basic Metabolic Panel: Recent Labs  Lab 05/26/22 0355 05/27/22 0333  NA 136 136  K 3.4* 4.0  CL 108 113*  CO2 20* 19*  GLUCOSE 166* 101*  BUN 12 11  CREATININE 0.99 1.01  CALCIUM 8.7* 8.0*  MG 1.9  --   PHOS 1.3*  --     GFR: Estimated Creatinine Clearance: 64.2 mL/min (by C-G formula based on SCr of 1.01 mg/dL). Liver Function Tests: Recent Labs  Lab 05/26/22 0355 05/27/22 0333  AST 18 13*  ALT 15 14  ALKPHOS 75 55  BILITOT 0.9 0.5  PROT 6.7 5.8*  ALBUMIN 3.4* 2.7*    No results for input(s): "LIPASE", "AMYLASE" in the last 168 hours. No results for input(s): "AMMONIA" in the last 168 hours. Coagulation Profile: Recent Labs  Lab 05/26/22 0355  INR 1.2    Cardiac Enzymes: No results for input(s): "CKTOTAL", "CKMB", "CKMBINDEX", "TROPONINI" in the last 168 hours. BNP (last 3 results) No results for input(s): "PROBNP" in the last 8760 hours. HbA1C: No results for input(s): "HGBA1C" in the last 72 hours. CBG: No results for input(s): "GLUCAP" in the last 168 hours. Lipid Profile: No results for input(s): "CHOL", "HDL", "LDLCALC", "TRIG", "CHOLHDL", "LDLDIRECT" in the last 72 hours. Thyroid Function Tests: No results for input(s): "TSH", "T4TOTAL", "FREET4", "T3FREE", "THYROIDAB" in the last 72 hours. Anemia Panel: No results for input(s): "VITAMINB12", "FOLATE",  "FERRITIN", "TIBC", "IRON", "RETICCTPCT" in the last 72 hours. Sepsis Labs: Recent Labs  Lab 05/26/22 0327 05/26/22 0505  LATICACIDVEN 1.5 1.8     Recent Results (from the past 240 hour(s))  Urine Culture     Status: Abnormal   Collection Time: 05/26/22  3:16 AM   Specimen: Urine, Random  Result Value Ref Range Status   Specimen Description   Final    URINE, RANDOM Performed at [facility] [address] [place], [place] [address]    Special Requests   Final    URINE, CATHETERIZED Performed at [facility], [place] [address] [place], [place] [address]    Culture MULTIPLE SPECIES PRESENT, SUGGEST RECOLLECTION (A)  Final   Report Status 05/27/2022 FINAL  Final  Blood Culture (routine x 2)     Status: Abnormal   Collection Time: 05/26/22  3:27 AM   Specimen: BLOOD  Result Value Ref Range Status   Specimen Description   Final    BLOOD RIGHT ANTECUBITAL Performed at [facility] [address], [place], [place] [address]    Special Requests   Final  BOTTLES DRAWN AEROBIC AND ANAEROBIC Blood Culture adequate volume Performed at [facility] [address] [place], [place] [address]    Culture  Setup Time   Final    GRAM POSITIVE COCCI AEROBIC BOTTLE ONLY GRAM NEGATIVE RODS ANAEROBIC BOTTLE ONLY CRITICAL RESULT CALLED TO, READ BACK BY AND VERIFIED WITH: PHARMD E. JACKSON 05/26/22 @ 2341 BY AB    Culture (A)  Final    KLEBSIELLA PNEUMONIAE ENTEROCOCCUS FAECALIS SUSCEPTIBILITIES PERFORMED ON PREVIOUS CULTURE WITHIN THE LAST 5 DAYS. Performed at [facility], [place] [address], [place], [place] [address]    Report Status 05/29/2022 FINAL  Final   Organism ID, Bacteria KLEBSIELLA PNEUMONIAE  Final      Susceptibility   Klebsiella pneumoniae - MIC*    AMPICILLIN >=32 RESISTANT Resistant     CEFEPIME <=0.12 SENSITIVE Sensitive     CEFTAZIDIME <=1 SENSITIVE Sensitive     CEFTRIAXONE <=0.25 SENSITIVE  Sensitive     CIPROFLOXACIN <=0.25 SENSITIVE Sensitive     GENTAMICIN <=1 SENSITIVE Sensitive     IMIPENEM <=0.25 SENSITIVE Sensitive     TRIMETH/SULFA <=20 SENSITIVE Sensitive     AMPICILLIN/SULBACTAM 4 SENSITIVE Sensitive     PIP/TAZO 8 SENSITIVE Sensitive     * KLEBSIELLA PNEUMONIAE  Blood Culture ID Panel (Reflexed)     Status: Abnormal   Collection Time: 05/26/22  3:27 AM  Result Value Ref Range Status   Enterococcus faecalis DETECTED (A) NOT DETECTED Final    Comment: CRITICAL RESULT CALLED TO, READ BACK BY AND VERIFIED WITH: PHARMD E. JACKSON 05/26/22 @ 2341 BY AB    Enterococcus Faecium NOT DETECTED NOT DETECTED Final   Listeria monocytogenes NOT DETECTED NOT DETECTED Final   Staphylococcus species NOT DETECTED NOT DETECTED Final   Staphylococcus aureus (BCID) NOT DETECTED NOT DETECTED Final   Staphylococcus epidermidis NOT DETECTED NOT DETECTED Final   Staphylococcus lugdunensis NOT DETECTED NOT DETECTED Final   Streptococcus species NOT DETECTED NOT DETECTED Final   Streptococcus agalactiae NOT DETECTED NOT DETECTED Final   Streptococcus pneumoniae NOT DETECTED NOT DETECTED Final   Streptococcus pyogenes NOT DETECTED NOT DETECTED Final   A.calcoaceticus-baumannii NOT DETECTED NOT DETECTED Final   Bacteroides fragilis NOT DETECTED NOT DETECTED Final   Enterobacterales NOT DETECTED NOT DETECTED Final   Enterobacter cloacae complex NOT DETECTED NOT DETECTED Final   Escherichia coli NOT DETECTED NOT DETECTED Final   Klebsiella aerogenes NOT DETECTED NOT DETECTED Final   Klebsiella oxytoca NOT DETECTED NOT DETECTED Final   Klebsiella pneumoniae NOT DETECTED NOT DETECTED Final   Proteus species NOT DETECTED NOT DETECTED Final   Salmonella species NOT DETECTED NOT DETECTED Final   Serratia marcescens NOT DETECTED NOT DETECTED Final   Haemophilus influenzae NOT DETECTED NOT DETECTED Final   Neisseria meningitidis NOT DETECTED NOT DETECTED Final   Pseudomonas aeruginosa NOT  DETECTED NOT DETECTED Final   Stenotrophomonas maltophilia NOT DETECTED NOT DETECTED Final   Candida albicans NOT DETECTED NOT DETECTED Final   Candida auris NOT DETECTED NOT DETECTED Final   Candida glabrata NOT DETECTED NOT DETECTED Final   Candida krusei NOT DETECTED NOT DETECTED Final   Candida parapsilosis NOT DETECTED NOT DETECTED Final   Candida tropicalis NOT DETECTED NOT DETECTED Final   Cryptococcus neoformans/gattii NOT DETECTED NOT DETECTED Final   Vancomycin resistance NOT DETECTED NOT DETECTED Final    Comment: Performed at [facility], [place] [address] [place], [place] [address]  Blood Culture (routine x 2)  Status: Abnormal   Collection Time: 05/26/22  3:33 AM   Specimen: BLOOD  Result Value Ref Range Status   Specimen Description   Final    BLOOD LEFT ANTECUBITAL Performed at [facility] [address] [place], [place] [address]    Special Requests   Final    BOTTLES DRAWN AEROBIC AND ANAEROBIC Blood Culture adequate volume Performed at [facility] [address] [place], [place] [address]    Culture  Setup Time   Final    GRAM POSITIVE COCCI IN BOTH AEROBIC AND ANAEROBIC BOTTLES CRITICAL VALUE NOTED.  VALUE IS CONSISTENT WITH PREVIOUSLY REPORTED AND CALLED VALUE. Performed at [facility], [place] [address] [place], [place] [address]    Culture ENTEROCOCCUS FAECALIS (A)  Final   Report Status 05/28/2022 FINAL  Final   Organism ID, Bacteria ENTEROCOCCUS FAECALIS  Final      Susceptibility   Enterococcus faecalis - MIC*    AMPICILLIN <=2 SENSITIVE Sensitive     VANCOMYCIN 1 SENSITIVE Sensitive     GENTAMICIN SYNERGY SENSITIVE Sensitive     * ENTEROCOCCUS FAECALIS  Resp panel by RT-PCR (RSV, Flu A&B, Covid) Anterior Nasal Swab     Status: None   Collection Time: 05/26/22  3:55 AM   Specimen: Anterior Nasal Swab  Result Value Ref Range Status   SARS Coronavirus 2 by RT PCR NEGATIVE NEGATIVE Final     Comment: (NOTE) SARS-CoV-2 target nucleic acids are NOT DETECTED.  The SARS-CoV-2 RNA is generally detectable in upper respiratory specimens during the acute phase of infection. The lowest concentration of SARS-CoV-2 viral copies this assay can detect is 138 copies/mL. A negative result does not preclude SARS-Cov-2 infection and should not be used as the sole basis for treatment or other patient management decisions. A negative result may occur with  improper specimen collection/handling, submission of specimen other than nasopharyngeal swab, presence of viral mutation(s) within the areas targeted by this assay, and inadequate number of viral copies(<138 copies/mL). A negative result must be combined with clinical observations, patient history, and epidemiological information. The expected result is Negative.  Fact Sheet for Patients:  EntrepreneurPulse.com.au  Fact Sheet for Healthcare Providers:  IncredibleEmployment.be  This test is no t yet approved or cleared by the [place] FDA and  has been authorized for detection and/or diagnosis of SARS-CoV-2 by FDA under an Emergency Use Authorization (EUA). This EUA will remain  in effect (meaning this test can be used) for the duration of the COVID-19 declaration under Section 564(b)(1) of the Act, 21 U.S.C.section 360bbb-3(b)(1), unless the authorization is terminated  or revoked sooner.       Influenza A by PCR NEGATIVE NEGATIVE Final   Influenza B by PCR NEGATIVE NEGATIVE Final    Comment: (NOTE) The Xpert Xpress SARS-CoV-2/FLU/RSV plus assay is intended as an aid in the diagnosis of influenza from Nasopharyngeal swab specimens and should not be used as a sole basis for treatment. Nasal washings and aspirates are unacceptable for Xpert Xpress SARS-CoV-2/FLU/RSV testing.  Fact Sheet for Patients: EntrepreneurPulse.com.au  Fact Sheet for Healthcare  Providers: IncredibleEmployment.be  This test is not yet approved or cleared by the [place] FDA and has been authorized for detection and/or diagnosis of SARS-CoV-2 by FDA under an Emergency Use Authorization (EUA). This EUA will remain in effect (meaning this test can be used) for the duration of the COVID-19 declaration under Section 564(b)(1) of the Act, 21 U.S.C. section 360bbb-3(b)(1), unless the authorization  is terminated or revoked.     Resp Syncytial Virus by PCR NEGATIVE NEGATIVE Final    Comment: (NOTE) Fact Sheet for Patients: EntrepreneurPulse.com.au  Fact Sheet for Healthcare Providers: IncredibleEmployment.be  This test is not yet approved or cleared by the [place] FDA and has been authorized for detection and/or diagnosis of SARS-CoV-2 by FDA under an Emergency Use Authorization (EUA). This EUA will remain in effect (meaning this test can be used) for the duration of the COVID-19 declaration under Section 564(b)(1) of the Act, 21 U.S.C. section 360bbb-3(b)(1), unless the authorization is terminated or revoked.  Performed at [facility], [place] [address], [place], [place] [address]   Blood Culture ID Panel (Reflexed)     Status: Abnormal   Collection Time: 05/26/22 11:30 PM  Result Value Ref Range Status   Enterococcus faecalis NOT DETECTED NOT DETECTED Final   Enterococcus Faecium NOT DETECTED NOT DETECTED Final   Listeria monocytogenes NOT DETECTED NOT DETECTED Final   Staphylococcus species NOT DETECTED NOT DETECTED Final   Staphylococcus aureus (BCID) NOT DETECTED NOT DETECTED Final   Staphylococcus epidermidis NOT DETECTED NOT DETECTED Final   Staphylococcus lugdunensis NOT DETECTED NOT DETECTED Final   Streptococcus species NOT DETECTED NOT DETECTED Final   Streptococcus agalactiae NOT DETECTED NOT DETECTED Final   Streptococcus pneumoniae NOT DETECTED NOT DETECTED Final    Streptococcus pyogenes NOT DETECTED NOT DETECTED Final   A.calcoaceticus-baumannii NOT DETECTED NOT DETECTED Final   Bacteroides fragilis NOT DETECTED NOT DETECTED Final   Enterobacterales DETECTED (A) NOT DETECTED Final    Comment: Enterobacterales represent a large order of gram negative bacteria, not a single organism. CRITICAL RESULT CALLED TO, READ BACK BY AND VERIFIED WITH: PHARMD E. JACKSON 05/26/22 @ 2341 BY AB    Enterobacter cloacae complex NOT DETECTED NOT DETECTED Final   Escherichia coli NOT DETECTED NOT DETECTED Final   Klebsiella aerogenes NOT DETECTED NOT DETECTED Final   Klebsiella oxytoca NOT DETECTED NOT DETECTED Final   Klebsiella pneumoniae DETECTED (A) NOT DETECTED Final    Comment: CRITICAL RESULT CALLED TO, READ BACK BY AND VERIFIED WITH: PHARMD E. JACKSON 05/26/22 @ 2341 BY AB    Proteus species NOT DETECTED NOT DETECTED Final   Salmonella species NOT DETECTED NOT DETECTED Final   Serratia marcescens NOT DETECTED NOT DETECTED Final   Haemophilus influenzae NOT DETECTED NOT DETECTED Final   Neisseria meningitidis NOT DETECTED NOT DETECTED Final   Pseudomonas aeruginosa NOT DETECTED NOT DETECTED Final   Stenotrophomonas maltophilia NOT DETECTED NOT DETECTED Final   Candida albicans NOT DETECTED NOT DETECTED Final   Candida auris NOT DETECTED NOT DETECTED Final   Candida glabrata NOT DETECTED NOT DETECTED Final   Candida krusei NOT DETECTED NOT DETECTED Final   Candida parapsilosis NOT DETECTED NOT DETECTED Final   Candida tropicalis NOT DETECTED NOT DETECTED Final   Cryptococcus neoformans/gattii NOT DETECTED NOT DETECTED Final   CTX-M ESBL NOT DETECTED NOT DETECTED Final   Carbapenem resistance IMP NOT DETECTED NOT DETECTED Final   Carbapenem resistance KPC NOT DETECTED NOT DETECTED Final   Carbapenem resistance NDM NOT DETECTED NOT DETECTED Final   Carbapenem resist OXA 48 LIKE NOT DETECTED NOT DETECTED Final   Carbapenem resistance VIM NOT DETECTED NOT  DETECTED Final    Comment: Performed at [facility], [address], [place], [place] [address]  Culture, blood (Routine X 2) w Reflex to ID Panel     Status: None (Preliminary result)   Collection Time:  05/27/22  1:35 PM   Specimen: BLOOD  Result Value Ref Range Status   Specimen Description   Final    BLOOD SITE NOT SPECIFIED Performed at [facility] [address] [place], [place] [address]    Special Requests   Final    BOTTLES DRAWN AEROBIC AND ANAEROBIC Blood Culture adequate volume Performed at [facility] [address], [place], [place] [address]    Culture   Final    NO GROWTH 2 DAYS Performed at [facility] [address] [place], [place] [address]    Report Status PENDING  Incomplete  Culture, blood (Routine X 2) w Reflex to ID Panel     Status: None (Preliminary result)   Collection Time: 05/27/22  3:46 PM   Specimen: BLOOD  Result Value Ref Range Status   Specimen Description   Final    BLOOD SITE NOT SPECIFIED Performed at [facility] [address], [place], [place] [address]    Special Requests   Final    BOTTLES DRAWN AEROBIC ONLY Blood Culture adequate volume Performed at [facility] [address], [place], [place] [address]    Culture   Final    NO GROWTH 2 DAYS Performed at [facility] [address], [place], [place] [address]    Report Status PENDING  Incomplete         Radiology Studies: ECHOCARDIOGRAM COMPLETE  Result Date: 05/28/2022    ECHOCARDIOGRAM REPORT   Patient Name:   HANZO FLYTE Date of Exam: 05/28/2022 Medical Rec #:  [id number]      Height:       72.0 in Accession #:    [id number]     Weight:       220.0 lb Date of Birth:  1933-05-12      BSA:          2.219 m Patient Age:    49 years       BP:           139/82 mmHg Patient Gender: M              HR:           78 bpm. Exam Location:  Inpatient Procedure: 2D Echo, Color  Doppler, Cardiac Doppler and Intracardiac            Opacification Agent Indications:    Bacteremia  History:        Patient has no prior history of Echocardiogram examinations.                 Risk Factors:Dyslipidemia. CKD.  Sonographer:    Eartha Inch Referring Phys: [id number] [place]  Sonographer Comments: Technically difficult study due to poor echo windows and patient is obese. Image acquisition challenging due to patient body habitus and Image acquisition challenging due to respiratory motion. IMPRESSIONS  1. Left ventricular ejection fraction, by estimation, is 60 to 65%. The left ventricle has normal function. The left ventricle has no regional wall motion abnormalities. Left ventricular diastolic parameters are consistent with Grade I diastolic dysfunction (impaired relaxation).  2. Right ventricular systolic function is normal. The right ventricular size is normal. Tricuspid regurgitation signal is inadequate for assessing PA pressure.  3. The mitral valve is normal in structure. No evidence of mitral valve regurgitation. No evidence of mitral stenosis.  4. The aortic valve was not well visualized. Aortic valve regurgitation is not visualized.  5. Aortic dilatation noted. There is mild dilatation of the ascending aorta, measuring 40 mm. Conclusion(s)/Recommendation(s): No evidence of valvular vegetations on this transthoracic echocardiogram. Consider a transesophageal echocardiogram to exclude infective endocarditis if clinically indicated. FINDINGS  Left Ventricle: Left ventricular ejection fraction, by estimation, is 60 to 65%. The left ventricle has normal function. The left ventricle has no regional wall motion abnormalities. Definity contrast agent was given IV to delineate the left ventricular  endocardial borders. The left ventricular internal cavity size was normal in size. There is no left ventricular hypertrophy. Left ventricular diastolic parameters are consistent with Grade I  diastolic dysfunction (impaired relaxation). Normal left ventricular filling pressure. Right Ventricle: The right ventricular size is normal. No increase in right ventricular wall thickness. Right ventricular systolic function is normal. Tricuspid regurgitation signal is inadequate for assessing PA pressure. Left Atrium: Left atrial size was normal in size. Right Atrium: Right atrial size was normal in size. Pericardium: There is no evidence of pericardial effusion. Mitral Valve: The mitral valve is normal in structure. No evidence of mitral valve regurgitation. No evidence of mitral valve stenosis. Tricuspid Valve: The tricuspid valve is normal in structure. Tricuspid valve regurgitation is not demonstrated. No evidence of tricuspid stenosis. Aortic Valve: The aortic valve was not well visualized. Aortic valve regurgitation is not visualized. Pulmonic Valve: The pulmonic valve was normal in structure. Pulmonic valve regurgitation is not visualized. No evidence of pulmonic stenosis. Aorta: Aortic dilatation noted. There is mild dilatation of the ascending aorta, measuring 40 mm. Venous: The inferior vena cava was not well visualized. IAS/Shunts: No atrial level shunt detected by color flow Doppler.  LEFT VENTRICLE PLAX 2D LVIDd:         4.60 cm   Diastology LVIDs:         3.40 cm   LV e' medial:    6.85 cm/s LV PW:         1.00 cm   LV E/e' medial:  9.4 LV IVS:        1.10 cm   LV e' lateral:   6.09 cm/s LVOT diam:     2.00 cm   LV E/e' lateral: 10.6 LV SV:         90 LV SV Index:   41 LVOT Area:     3.14 cm  RIGHT VENTRICLE RV S prime:     14.50 cm/s TAPSE (M-mode): 2.2 cm LEFT ATRIUM             Index        RIGHT ATRIUM           Index LA diam:        3.40 cm 1.53 cm/m   RA Area:     15.00 cm LA Vol (A2C):   64.5 ml 29.07 ml/m  RA Volume:   34.00 ml  15.32 ml/m LA Vol (A4C):   54.7 ml 24.65 ml/m LA Biplane Vol: 64.3 ml 28.98 ml/m  AORTIC VALVE LVOT Vmax:   149.00 cm/s LVOT Vmean:  108.000 cm/s LVOT VTI:     0.287 m  AORTA Ao Root diam: 3.40 cm Ao Asc diam:  4.00 cm MITRAL VALVE MV Area (PHT): 2.69 cm    SHUNTS MV Decel Time: 282 msec    Systemic VTI:  0.29 m MV E velocity: 64.50 cm/s  Systemic Diam: 2.00 cm MV A velocity: 83.70 cm/s MV E/A ratio:  0.77 Fransico Him MD Electronically signed by Fransico Him MD Signature Date/Time: 05/28/2022/5:18:51 PM  Final         Scheduled Meds:  Chlorhexidine Gluconate Cloth  6 each Topical Daily   cycloSPORINE  1 drop Both Eyes BID   dorzolamide  1 drop Both Eyes BID   And   timolol  1 drop Both Eyes BID   finasteride  5 mg Oral q morning   ketorolac  1 drop Right Eye Daily   latanoprost  1 drop Both Eyes QHS   Netarsudil Dimesylate  1 drop Both Eyes QHS   polyvinyl alcohol  1 drop Both Eyes QHS   simvastatin  40 mg Oral QPM   tamsulosin  0.4 mg Oral QHS   Continuous Infusions:  ampicillin (OMNIPEN) IV 2 g (05/29/22 1140)   cefTRIAXone (ROCEPHIN)  IV Stopped (05/29/22 0125)     LOS: 3 days   Time spent: 30 minutes Georgette Shell, MD  05/29/2022, 3:07 PM

## 2022-05-29 NOTE — Care Management Important Message (Signed)
Important Message  Patient Details IM Letter given. Name: DJ MCCRANIE MRN: [id number] Date of Birth: 1933-04-24   Medicare Important Message Given:  Yes     Kerin Salen 05/29/2022, 9:24 AM

## 2022-05-30 DIAGNOSIS — R319 Hematuria, unspecified: Secondary | ICD-10-CM | POA: Diagnosis not present

## 2022-05-30 DIAGNOSIS — R7881 Bacteremia: Secondary | ICD-10-CM | POA: Diagnosis not present

## 2022-05-30 DIAGNOSIS — N390 Urinary tract infection, site not specified: Secondary | ICD-10-CM | POA: Diagnosis not present

## 2022-05-30 DIAGNOSIS — B952 Enterococcus as the cause of diseases classified elsewhere: Secondary | ICD-10-CM | POA: Diagnosis not present

## 2022-05-30 NOTE — Progress Notes (Signed)
Mobility Specialist - Progress Note   05/30/22 1024  Mobility  Activity Ambulated with assistance in hallway  Level of Assistance Modified independent, requires aide device or extra time  Assistive Device Front wheel walker  Distance Ambulated (ft) 140 ft  Activity Response Tolerated well  Mobility Referral Yes  $Mobility charge 1 Mobility   Pt received in bathroom standing and agreeable to mobility. No complaints during session. Pt to recliner after session with all needs met w/ nurse in room.   [facility]

## 2022-05-30 NOTE — Progress Notes (Signed)
    [place] for Infectious Disease    Date of Admission:  05/26/2022   Total days of antibiotics 4 on amp/ceftriaxone   ID: Glenn Patel is a 89 y.o. male with  polymicrobial bacteremia Principal Problem:   UTI (urinary tract infection) Active Problems:   Hypokalemia   Hyperlipidemia   BPH (benign prostatic hyperplasia)   Glaucoma   Hypophosphatemia    Subjective: Afebrile. Feeling better. Ambulating with walker around the unit.  Medications:   Chlorhexidine Gluconate Cloth  6 each Topical Daily   cycloSPORINE  1 drop Both Eyes BID   dorzolamide  1 drop Both Eyes BID   And   timolol  1 drop Both Eyes BID   finasteride  5 mg Oral q morning   ketorolac  1 drop Right Eye Daily   latanoprost  1 drop Both Eyes QHS   Netarsudil Dimesylate  1 drop Both Eyes QHS   polyvinyl alcohol  1 drop Both Eyes QHS   simvastatin  40 mg Oral QPM   tamsulosin  0.4 mg Oral QHS    Objective: Vital signs in last 24 hours: Temp:  [97.7 F (36.5 C)-98 F (36.7 C)] 97.8 F (36.6 C) (02/28 1327) Pulse Rate:  [68-73] 71 (02/28 1327) Resp:  [15-17] 16 (02/28 1327) BP: (133-160)/(73-92) 160/88 (02/28 1327) SpO2:  [98 %-100 %] 100 % (02/28 1327) FiO2 (%):  [21 %] 21 % (02/27 1945)  Physical Exam  Constitutional: He is oriented to person, place, and time. He appears well-developed and well-nourished. No distress.  HENT:  Mouth/Throat: Oropharynx is clear and moist. No oropharyngeal exudate.  Cardiovascular: Normal rate, regular rhythm and normal heart sounds. Exam reveals no gallop and no friction rub.  No murmur heard.  Pulmonary/Chest: Effort normal and breath sounds normal. No respiratory distress. He has no wheezes.  Abdominal: Soft. Bowel sounds are normal. He exhibits no distension. There is no tenderness.  Lymphadenopathy:  He has no cervical adenopathy.  Neurological: He is alert and oriented to person, place, and time.  Skin: Skin is warm and dry. No rash noted. No erythema.   Psychiatric: He has a normal mood and affect. His behavior is normal.   Lab Results No results for input(s): "WBC", "HGB", "HCT", "NA", "K", "CL", "CO2", "BUN", "CREATININE", "GLU" in the last 72 hours.  Invalid input(s): "PLATELETS" Liver Panel No results for input(s): "PROT", "ALBUMIN", "AST", "ALT", "ALKPHOS", "BILITOT", "BILIDIR", "IBILI" in the last 72 hours. Sedimentation Rate No results for input(s): "ESRSEDRATE" in the last 72 hours. C-Reactive Protein No results for input(s): "CRP" in the last 72 hours.  Microbiology: 2/24 blood cx enterococcus and klebsiella 2/24 blood cx ngtd Studies/Results: No results found.   Assessment/Plan:bacteremia from urinary source Will check cbc with diff, bmp to see improving since admit  Enterococcal bacteremia = would like TEE scheduled for Friday to rule out endocarditis. If negative, then can transition to oral abtx to complete course of therapy. If +, then will need picc line for 6 wk iv abtx. Continue on amp/ceftriaxone  Klebsiella bacteremia = currently on ceftriaxone  [facility] Pager: [phone]  05/30/2022, 5:20 PM

## 2022-05-30 NOTE — Progress Notes (Signed)
PROGRESS NOTE   Glenn Patel  [id number] DOB: February 04, 1934 DOA: 05/26/2022 PCP: Lujean Amel, MD  Brief Narrative:  89 year old white male Known osteoarthritis, skin cancer Nephrolithiasis with urinary retention and BPH-had a robotic water jet ablation of the prostate [for Nocturia] 05/21/2022 with Dr. Gloriann Loan and was d/c home subsequently  [place] [facility] 05/26/2022 Tmax 100 WBC 11 UA large leukocytes, CXR negative-admit for UTI Rx vancomycin and cefepime initially Blood culture = Enterococcus  2/26-TTE negative for endocarditis  Hospital-Problem based course  Sepsis on admission 2/2 Enterococcus faecalis, Klebsiella bacteremia from recent instrumentation of urinary tract - Continue ampicillin and Rocephin or as per ID - Blood culture 2/25 NGTD -TEE planned at some point? - PICC line versus midline as per ID  BPH, prior nephrolithiasis Robotic water jet ablation prostate 2/19 - Outpatient follow-up Dr. Gloriann Loan - Foley removed 2/26 under instruction of urology - Continues on Proscar 5 daily, Flomax 0.4 at bedtime   DVT prophylaxis: SCD Code Status: Full Family Communication: None Disposition:  Status is: Inpatient Remains inpatient appropriate because:   Requires further treatment of bacteremia    Subjective:   Awake coherent  walked laps x 5 Eating drinking no fever no chills no n/v  Objective: Vitals:   05/29/22 1144 05/29/22 1934 05/29/22 1945 05/30/22 0505  BP: 135/77 133/73  (!) 159/92  Pulse: 70 72 68 73  Resp: '18 16 15 17  '$ Temp: 98 F (36.7 C) 97.7 F (36.5 C)  98 F (36.7 C)  TempSrc: Oral   Oral  SpO2: 98% 98%  99%  Weight:      Height:        Intake/Output Summary (Last 24 hours) at 05/30/2022 1306 Last data filed at 05/30/2022 1025 Gross per 24 hour  Intake 220 ml  Output --  Net 220 ml   Filed Weights   05/26/22 0320  Weight: 99.8 kg    Examination:  Eomi ncat no pallor no icterus Moderate dentition Cta b no added sound Abd soft  nt nd no rebound No LE edema Neuro intact no deficit  Data Reviewed: personally reviewed   CBC    Component Value Date/Time   WBC 8.4 05/27/2022 0333   RBC 3.39 (L) 05/27/2022 0333   HGB 8.6 (L) 05/27/2022 0333   HCT 27.0 (L) 05/27/2022 0333   PLT 185 05/27/2022 0333   MCV 79.6 (L) 05/27/2022 0333   MCH 25.4 (L) 05/27/2022 0333   MCHC 31.9 05/27/2022 0333   RDW 15.9 (H) 05/27/2022 0333   LYMPHSABS 0.7 05/26/2022 0355   MONOABS 0.7 05/26/2022 0355   EOSABS 0.0 05/26/2022 0355   BASOSABS 0.0 05/26/2022 0355      Latest Ref Rng & Units 05/27/2022    3:33 AM 05/26/2022    3:55 AM 05/08/2022    8:30 AM  CMP  Glucose 70 - 99 mg/dL 101  166  99   BUN 8 - 23 mg/dL '11  12  13   '$ Creatinine 0.61 - 1.24 mg/dL 1.01  0.99  1.34   Sodium 135 - 145 mmol/L 136  136  137   Potassium 3.5 - 5.1 mmol/L 4.0  3.4  4.4   Chloride 98 - 111 mmol/L 113  108  107   CO2 22 - 32 mmol/L '19  20  23   '$ Calcium 8.9 - 10.3 mg/dL 8.0  8.7  9.4   Total Protein 6.5 - 8.1 g/dL 5.8  6.7    Total Bilirubin 0.3 - 1.2 mg/dL  0.5  0.9    Alkaline Phos 38 - 126 U/L 55  75    AST 15 - 41 U/L 13  18    ALT 0 - 44 U/L 14  15       Radiology Studies: ECHOCARDIOGRAM COMPLETE  Result Date: 05/28/2022    ECHOCARDIOGRAM REPORT   Patient Name:   Glenn Patel Date of Exam: 05/28/2022 Medical Rec #:  [id number]      Height:       72.0 in Accession #:    [id number]     Weight:       220.0 lb Date of Birth:  06/16/1933      BSA:          2.219 m Patient Age:    31 years       BP:           139/82 mmHg Patient Gender: M              HR:           78 bpm. Exam Location:  Inpatient Procedure: 2D Echo, Color Doppler, Cardiac Doppler and Intracardiac            Opacification Agent Indications:    Bacteremia  History:        Patient has no prior history of Echocardiogram examinations.                 Risk Factors:Dyslipidemia. CKD.  Sonographer:    Eartha Inch Referring Phys: [id number] [place]  Sonographer Comments:  Technically difficult study due to poor echo windows and patient is obese. Image acquisition challenging due to patient body habitus and Image acquisition challenging due to respiratory motion. IMPRESSIONS  1. Left ventricular ejection fraction, by estimation, is 60 to 65%. The left ventricle has normal function. The left ventricle has no regional wall motion abnormalities. Left ventricular diastolic parameters are consistent with Grade I diastolic dysfunction (impaired relaxation).  2. Right ventricular systolic function is normal. The right ventricular size is normal. Tricuspid regurgitation signal is inadequate for assessing PA pressure.  3. The mitral valve is normal in structure. No evidence of mitral valve regurgitation. No evidence of mitral stenosis.  4. The aortic valve was not well visualized. Aortic valve regurgitation is not visualized.  5. Aortic dilatation noted. There is mild dilatation of the ascending aorta, measuring 40 mm. Conclusion(s)/Recommendation(s): No evidence of valvular vegetations on this transthoracic echocardiogram. Consider a transesophageal echocardiogram to exclude infective endocarditis if clinically indicated. FINDINGS  Left Ventricle: Left ventricular ejection fraction, by estimation, is 60 to 65%. The left ventricle has normal function. The left ventricle has no regional wall motion abnormalities. Definity contrast agent was given IV to delineate the left ventricular  endocardial borders. The left ventricular internal cavity size was normal in size. There is no left ventricular hypertrophy. Left ventricular diastolic parameters are consistent with Grade I diastolic dysfunction (impaired relaxation). Normal left ventricular filling pressure. Right Ventricle: The right ventricular size is normal. No increase in right ventricular wall thickness. Right ventricular systolic function is normal. Tricuspid regurgitation signal is inadequate for assessing PA pressure. Left Atrium: Left  atrial size was normal in size. Right Atrium: Right atrial size was normal in size. Pericardium: There is no evidence of pericardial effusion. Mitral Valve: The mitral valve is normal in structure. No evidence of mitral valve regurgitation. No evidence of mitral valve stenosis. Tricuspid Valve: The tricuspid valve is normal in structure. Tricuspid valve regurgitation is  not demonstrated. No evidence of tricuspid stenosis. Aortic Valve: The aortic valve was not well visualized. Aortic valve regurgitation is not visualized. Pulmonic Valve: The pulmonic valve was normal in structure. Pulmonic valve regurgitation is not visualized. No evidence of pulmonic stenosis. Aorta: Aortic dilatation noted. There is mild dilatation of the ascending aorta, measuring 40 mm. Venous: The inferior vena cava was not well visualized. IAS/Shunts: No atrial level shunt detected by color flow Doppler.  LEFT VENTRICLE PLAX 2D LVIDd:         4.60 cm   Diastology LVIDs:         3.40 cm   LV e' medial:    6.85 cm/s LV PW:         1.00 cm   LV E/e' medial:  9.4 LV IVS:        1.10 cm   LV e' lateral:   6.09 cm/s LVOT diam:     2.00 cm   LV E/e' lateral: 10.6 LV SV:         90 LV SV Index:   41 LVOT Area:     3.14 cm  RIGHT VENTRICLE RV S prime:     14.50 cm/s TAPSE (M-mode): 2.2 cm LEFT ATRIUM             Index        RIGHT ATRIUM           Index LA diam:        3.40 cm 1.53 cm/m   RA Area:     15.00 cm LA Vol (A2C):   64.5 ml 29.07 ml/m  RA Volume:   34.00 ml  15.32 ml/m LA Vol (A4C):   54.7 ml 24.65 ml/m LA Biplane Vol: 64.3 ml 28.98 ml/m  AORTIC VALVE LVOT Vmax:   149.00 cm/s LVOT Vmean:  108.000 cm/s LVOT VTI:    0.287 m  AORTA Ao Root diam: 3.40 cm Ao Asc diam:  4.00 cm MITRAL VALVE MV Area (PHT): 2.69 cm    SHUNTS MV Decel Time: 282 msec    Systemic VTI:  0.29 m MV E velocity: 64.50 cm/s  Systemic Diam: 2.00 cm MV A velocity: 83.70 cm/s MV E/A ratio:  0.77 Fransico Him MD Electronically signed by Fransico Him MD Signature  Date/Time: 05/28/2022/5:18:51 PM    Final      Scheduled Meds:  Chlorhexidine Gluconate Cloth  6 each Topical Daily   cycloSPORINE  1 drop Both Eyes BID   dorzolamide  1 drop Both Eyes BID   And   timolol  1 drop Both Eyes BID   finasteride  5 mg Oral q morning   ketorolac  1 drop Right Eye Daily   latanoprost  1 drop Both Eyes QHS   Netarsudil Dimesylate  1 drop Both Eyes QHS   polyvinyl alcohol  1 drop Both Eyes QHS   simvastatin  40 mg Oral QPM   tamsulosin  0.4 mg Oral QHS   Continuous Infusions:  ampicillin (OMNIPEN) IV 2 g (05/30/22 1240)   cefTRIAXone (ROCEPHIN)  IV 200 mL/hr at 05/30/22 1025     LOS: 4 days   Time spent: [place], MD [facility] To contact the attending provider between 7A-7P or the covering provider during after hours 7P-7A, please log into the web site [email or web address] and access using universal [place] password for that web site. If you do not have the password, please call the hospital operator.  05/30/2022, 1:06 PM

## 2022-05-30 NOTE — TOC Progression Note (Signed)
Transition of Care [facility]) - Progression Note    Patient Details  Name: Glenn Patel MRN: [id number] Date of Birth: 24-Jan-1934  Transition of Care [facility]) CM/SW Contact  Loletha Grayer Beverely Pace, RN Phone Number: 05/30/2022, 1:39 PM  Clinical Narrative:     [facility] Team continues to follow patient for needs.   Transition of Care [facility]) Department has reviewed patient and no TOC needs have been identified at this time. We will continue to monitor patient advancement through Interdisciplinary progressions and if new patient needs arise, please place a consult.        Expected Discharge Plan and Services                                               Social Determinants of Health (SDOH) Interventions SDOH Screenings   Food Insecurity: No Food Insecurity (05/26/2022)  Housing: Low Risk  (05/26/2022)  Transportation Needs: No Transportation Needs (05/26/2022)  Utilities: Not At Risk (05/26/2022)  Tobacco Use: High Risk (05/26/2022)    Readmission Risk Interventions     No data to display

## 2022-05-31 DIAGNOSIS — R319 Hematuria, unspecified: Secondary | ICD-10-CM | POA: Diagnosis not present

## 2022-05-31 DIAGNOSIS — N390 Urinary tract infection, site not specified: Secondary | ICD-10-CM | POA: Diagnosis not present

## 2022-05-31 LAB — CBC WITH DIFFERENTIAL/PLATELET
Abs Immature Granulocytes: 0.04 10*3/uL (ref 0.00–0.07)
Basophils Absolute: 0 10*3/uL (ref 0.0–0.1)
Basophils Relative: 0 %
Eosinophils Absolute: 0.1 10*3/uL (ref 0.0–0.5)
Eosinophils Relative: 1 %
HCT: 28.1 % — ABNORMAL LOW (ref 39.0–52.0)
Hemoglobin: 8.7 g/dL — ABNORMAL LOW (ref 13.0–17.0)
Immature Granulocytes: 1 %
Lymphocytes Relative: 32 %
Lymphs Abs: 2.2 10*3/uL (ref 0.7–4.0)
MCH: 24.6 pg — ABNORMAL LOW (ref 26.0–34.0)
MCHC: 31 g/dL (ref 30.0–36.0)
MCV: 79.6 fL — ABNORMAL LOW (ref 80.0–100.0)
Monocytes Absolute: 0.6 10*3/uL (ref 0.1–1.0)
Monocytes Relative: 9 %
Neutro Abs: 3.9 10*3/uL (ref 1.7–7.7)
Neutrophils Relative %: 57 %
Platelets: 224 10*3/uL (ref 150–400)
RBC: 3.53 MIL/uL — ABNORMAL LOW (ref 4.22–5.81)
RDW: 15.5 % (ref 11.5–15.5)
WBC: 6.9 10*3/uL (ref 4.0–10.5)
nRBC: 0 % (ref 0.0–0.2)

## 2022-05-31 LAB — BASIC METABOLIC PANEL
Anion gap: 6 (ref 5–15)
BUN: 10 mg/dL (ref 8–23)
CO2: 24 mmol/L (ref 22–32)
Calcium: 8.5 mg/dL — ABNORMAL LOW (ref 8.9–10.3)
Chloride: 109 mmol/L (ref 98–111)
Creatinine, Ser: 0.96 mg/dL (ref 0.61–1.24)
GFR, Estimated: >60 mL/min (ref >60)
Glucose, Bld: 106 mg/dL — ABNORMAL HIGH (ref 70–99)
Potassium: 3.3 mmol/L — ABNORMAL LOW (ref 3.5–5.1)
Sodium: 139 mmol/L (ref 135–145)

## 2022-05-31 MED ORDER — MELATONIN 5 MG PO TABS
5.0000 mg | ORAL_TABLET | Freq: Every day | ORAL | Status: DC
  Administered 2022-05-31: 5 mg via ORAL
  Filled 2022-05-31: qty 1

## 2022-05-31 MED ORDER — POTASSIUM CHLORIDE CRYS ER 20 MEQ PO TBCR
40.0000 meq | EXTENDED_RELEASE_TABLET | Freq: Every day | ORAL | Status: DC
  Administered 2022-05-31 – 2022-06-01 (×2): 40 meq via ORAL
  Filled 2022-05-31 (×2): qty 2

## 2022-05-31 NOTE — Progress Notes (Addendum)
Patient consent was signed and added to chart for tomorrows TEE.   [facility] called, transport set for 10:15 on 06/01/22.   Dewayne Hatch, RN

## 2022-05-31 NOTE — Progress Notes (Signed)
   [facility] has been requested to perform a transesophageal echocardiogram on Glenn Patel for complicated UTI and bacteremia.  After careful review of history and examination, the risks and benefits of transesophageal echocardiogram have been explained including risks of esophageal damage, perforation (1:10,000 risk), bleeding, pharyngeal hematoma as well as other potential complications associated with anesthesia including aspiration, arrhythmia, respiratory failure and death. Alternatives to treatment were discussed, questions were answered. Patient is willing to proceed.   Patient admitted with complicated UTI and blood culture 05/26/2022 grew Enterococcus and Klebsiella.  TEE scheduled at 11 AM tomorrow with Dr. Debara Pickett.  Risk and benefit of the study has been discussed extensively with both the patient and wife who are agreeable to proceed.  No significant thrombocytopenia, hemoglobin 8.7 stable, vital signs stable.  [place], [place] 05/31/2022 10:32 AM

## 2022-05-31 NOTE — Progress Notes (Signed)
Mobility Specialist - Progress Note   05/31/22 1411  Mobility  Activity Ambulated with assistance in hallway  Level of Assistance Modified independent, requires aide device or extra time  Assistive Device Front wheel walker  Distance Ambulated (ft) 350 ft  Activity Response Tolerated well  Mobility Referral Yes  $Mobility charge 1 Mobility   Pt received in recliner and agreeable to mobility. No complaints during session. Pt to recliner after session with all needs met.    [facility] [place]

## 2022-05-31 NOTE — Progress Notes (Signed)
PROGRESS NOTE   Glenn Patel  [id number] DOB: 1933-06-14 DOA: 05/26/2022 PCP: Lujean Amel, MD  Brief Narrative:  89 year old white male Known osteoarthritis, skin cancer Nephrolithiasis with urinary retention and BPH-had a robotic water jet ablation of the prostate [for Nocturia] 05/21/2022 with Dr. Gloriann Loan and was d/c home subsequently  [place] ED 05/26/2022 Tmax 100 WBC 11 UA large leukocytes, CXR negative-admit for UTI Rx vancomycin and cefepime initially Blood culture = Enterococcus  2/26-TTE negative for endocarditis  Hospital-Problem based course  Sepsis on admission 2/2 Enterococcus faecalis, Klebsiella bacteremia from recent instrumentation of urinary tract - Continue ampicillin and Rocephin or as per ID - Blood culture 2/25 NGTD - TEE scheduled at [facility] around 11:00 2/30 - PICC line and decision regarding antibiotics to follow  BPH, prior nephrolithiasis Robotic water jet ablation prostate 2/19 - Outpatient follow-up Dr. Gloriann Loan - Foley removed 2/26 under instruction of urology - Continues on Proscar 5 daily, Flomax 0.4 at bedtime  Hypokalemia -Replace with p.o. potassium 40 meq - Check a.m. mag as procedure scheduled tomorrow  hypophosphatemia - Resolved  BMI 29.8 - Exercise as best possible once recovers from this illness  Hyperlipidemia glaucoma overall stable  DVT prophylaxis: SCD Code Status: Full Family Communication: None Disposition:  Status is: Inpatient Remains inpatient appropriate because:   Requires further treatment of bacteremia    Subjective:  Well no distress no fever no n/v Looks great Wife/Daughter bedside  Objective: Vitals:   05/30/22 1327 05/30/22 2043 05/30/22 2131 05/31/22 1403  BP: (!) 160/88  133/81 (!) 156/82  Pulse: 71 77 71 67  Resp: '16 18 17 14  '$ Temp: 97.8 F (36.6 C)  (!) 97.5 F (36.4 C) 98.3 F (36.8 C)  TempSrc: Oral  Oral   SpO2: 100%  98% 100%  Weight:      Height:         Intake/Output Summary (Last 24 hours) at 05/31/2022 1409 Last data filed at 05/31/2022 [id number] Gross per 24 hour  Intake 1213.92 ml  Output 250 ml  Net 963.92 ml    Filed Weights   05/26/22 0320  Weight: 99.8 kg    Examination:  Eomi ncat no pallor no icterus Moderate dentition Cta b no added sound S1 s2 hydralazine started earlier she was no m, cannot appreciate any M Abd soft nt nd no rebound No LE edema Neuro intact no deficit  Data Reviewed: personally reviewed   CBC    Component Value Date/Time   WBC 6.9 05/31/2022 0336   RBC 3.53 (L) 05/31/2022 0336   HGB 8.7 (L) 05/31/2022 0336   HCT 28.1 (L) 05/31/2022 0336   PLT 224 05/31/2022 0336   MCV 79.6 (L) 05/31/2022 0336   MCH 24.6 (L) 05/31/2022 0336   MCHC 31.0 05/31/2022 0336   RDW 15.5 05/31/2022 0336   LYMPHSABS 2.2 05/31/2022 0336   MONOABS 0.6 05/31/2022 0336   EOSABS 0.1 05/31/2022 0336   BASOSABS 0.0 05/31/2022 0336      Latest Ref Rng & Units 05/31/2022    3:36 AM 05/27/2022    3:33 AM 05/26/2022    3:55 AM  CMP  Glucose 70 - 99 mg/dL 106  101  166   BUN 8 - 23 mg/dL '10  11  12   '$ Creatinine 0.61 - 1.24 mg/dL 0.96  1.01  0.99   Sodium 135 - 145 mmol/L 139  136  136   Potassium 3.5 - 5.1 mmol/L 3.3  4.0  3.4  Chloride 98 - 111 mmol/L 109  113  108   CO2 22 - 32 mmol/L '24  19  20   '$ Calcium 8.9 - 10.3 mg/dL 8.5  8.0  8.7   Total Protein 6.5 - 8.1 g/dL  5.8  6.7   Total Bilirubin 0.3 - 1.2 mg/dL  0.5  0.9   Alkaline Phos 38 - 126 U/L  55  75   AST 15 - 41 U/L  13  18   ALT 0 - 44 U/L  14  15      Radiology Studies: No results found.   Scheduled Meds:  Chlorhexidine Gluconate Cloth  6 each Topical Daily   cycloSPORINE  1 drop Both Eyes BID   dorzolamide  1 drop Both Eyes BID   And   timolol  1 drop Both Eyes BID   finasteride  5 mg Oral q morning   ketorolac  1 drop Right Eye Daily   latanoprost  1 drop Both Eyes QHS   Netarsudil Dimesylate  1 drop Both Eyes QHS   polyvinyl alcohol  1  drop Both Eyes QHS   potassium chloride  40 mEq Oral Daily   simvastatin  40 mg Oral QPM   tamsulosin  0.4 mg Oral QHS   Continuous Infusions:  ampicillin (OMNIPEN) IV 2 g (05/31/22 1151)   cefTRIAXone (ROCEPHIN)  IV Stopped (05/31/22 0157)     LOS: 5 days   Time spent: [place], MD [facility] To contact the attending provider between 7A-7P or the covering provider during after hours 7P-7A, please log into the web site [email or web address] and access using universal [place] password for that web site. If you do not have the password, please call the hospital operator.  05/31/2022, 2:09 PM

## 2022-06-01 ENCOUNTER — Inpatient Hospital Stay (HOSPITAL_COMMUNITY): Payer: PPO | Admitting: Anesthesiology

## 2022-06-01 ENCOUNTER — Encounter (HOSPITAL_COMMUNITY)
Admission: EM | Disposition: A | Discharge status: Home / Self Care | Payer: Self-pay | Source: Home / Self Care | Attending: Internal Medicine

## 2022-06-01 ENCOUNTER — Encounter (HOSPITAL_COMMUNITY): Payer: Self-pay | Admitting: Family Medicine

## 2022-06-01 ENCOUNTER — Other Ambulatory Visit (HOSPITAL_COMMUNITY): Payer: Self-pay

## 2022-06-01 ENCOUNTER — Inpatient Hospital Stay (HOSPITAL_COMMUNITY)
Admit: 2022-06-01 | Discharge: 2022-06-01 | Disposition: A | Discharge status: Home / Self Care | Payer: PPO | Attending: Physician Assistant | Admitting: Physician Assistant

## 2022-06-01 DIAGNOSIS — B952 Enterococcus as the cause of diseases classified elsewhere: Secondary | ICD-10-CM

## 2022-06-01 DIAGNOSIS — I10 Essential (primary) hypertension: Secondary | ICD-10-CM

## 2022-06-01 DIAGNOSIS — I351 Nonrheumatic aortic (valve) insufficiency: Secondary | ICD-10-CM

## 2022-06-01 DIAGNOSIS — F17210 Nicotine dependence, cigarettes, uncomplicated: Secondary | ICD-10-CM

## 2022-06-01 DIAGNOSIS — R7881 Bacteremia: Secondary | ICD-10-CM | POA: Diagnosis not present

## 2022-06-01 DIAGNOSIS — N289 Disorder of kidney and ureter, unspecified: Secondary | ICD-10-CM

## 2022-06-01 HISTORY — PX: TEE WITHOUT CARDIOVERSION: SHX5443

## 2022-06-01 LAB — BASIC METABOLIC PANEL
Anion gap: 5 (ref 5–15)
BUN: 9 mg/dL (ref 8–23)
CO2: 22 mmol/L (ref 22–32)
Calcium: 8.4 mg/dL — ABNORMAL LOW (ref 8.9–10.3)
Chloride: 111 mmol/L (ref 98–111)
Creatinine, Ser: 1.04 mg/dL (ref 0.61–1.24)
GFR, Estimated: >60 mL/min (ref >60)
Glucose, Bld: 100 mg/dL — ABNORMAL HIGH (ref 70–99)
Potassium: 3.4 mmol/L — ABNORMAL LOW (ref 3.5–5.1)
Sodium: 138 mmol/L (ref 135–145)

## 2022-06-01 LAB — CBC WITH DIFFERENTIAL/PLATELET
Abs Immature Granulocytes: 0.04 10*3/uL (ref 0.00–0.07)
Basophils Absolute: 0 10*3/uL (ref 0.0–0.1)
Basophils Relative: 1 %
Eosinophils Absolute: 0.1 10*3/uL (ref 0.0–0.5)
Eosinophils Relative: 1 %
HCT: 27.2 % — ABNORMAL LOW (ref 39.0–52.0)
Hemoglobin: 8.7 g/dL — ABNORMAL LOW (ref 13.0–17.0)
Immature Granulocytes: 1 %
Lymphocytes Relative: 30 %
Lymphs Abs: 2.2 10*3/uL (ref 0.7–4.0)
MCH: 25.1 pg — ABNORMAL LOW (ref 26.0–34.0)
MCHC: 32 g/dL (ref 30.0–36.0)
MCV: 78.4 fL — ABNORMAL LOW (ref 80.0–100.0)
Monocytes Absolute: 0.6 10*3/uL (ref 0.1–1.0)
Monocytes Relative: 9 %
Neutro Abs: 4.3 10*3/uL (ref 1.7–7.7)
Neutrophils Relative %: 58 %
Platelets: 225 10*3/uL (ref 150–400)
RBC: 3.47 MIL/uL — ABNORMAL LOW (ref 4.22–5.81)
RDW: 15.4 % (ref 11.5–15.5)
WBC: 7.2 10*3/uL (ref 4.0–10.5)
nRBC: 0 % (ref 0.0–0.2)

## 2022-06-01 LAB — CULTURE, BLOOD (ROUTINE X 2)
Culture: NO GROWTH
Culture: NO GROWTH
Special Requests: ADEQUATE
Special Requests: ADEQUATE

## 2022-06-01 LAB — MAGNESIUM: Magnesium: 2.1 mg/dL (ref 1.7–2.4)

## 2022-06-01 LAB — ECHO TEE

## 2022-06-01 SURGERY — ECHOCARDIOGRAM, TRANSESOPHAGEAL
Anesthesia: Monitor Anesthesia Care

## 2022-06-01 MED ORDER — PROPOFOL 500 MG/50ML IV EMUL
INTRAVENOUS | Status: DC | PRN
  Administered 2022-06-01: 125 ug/kg/min via INTRAVENOUS

## 2022-06-01 MED ORDER — AMOXICILLIN 500 MG PO CAPS
1000.0000 mg | ORAL_CAPSULE | Freq: Three times a day (TID) | ORAL | 0 refills | Status: AC
  Filled 2022-06-01: qty 42, 7d supply, fill #0

## 2022-06-01 MED ORDER — PROPOFOL 10 MG/ML IV BOLUS
INTRAVENOUS | Status: DC | PRN
  Administered 2022-06-01 (×2): 20 mg via INTRAVENOUS

## 2022-06-01 MED ORDER — SODIUM CHLORIDE 0.9 % IV SOLN: INTRAVENOUS | Status: DC

## 2022-06-01 MED ORDER — LIDOCAINE HCL (CARDIAC) PF 100 MG/5ML IV SOSY
PREFILLED_SYRINGE | INTRAVENOUS | Status: DC | PRN
  Administered 2022-06-01: 60 mg via INTRAVENOUS
  Administered 2022-06-01: 40 mg via INTRAVENOUS

## 2022-06-01 NOTE — Anesthesia Procedure Notes (Signed)
Procedure Name: MAC Date/Time: 06/01/2022 10:30 AM  Performed by: Lieutenant Diego, CRNAPre-anesthesia Checklist: Patient identified, Emergency Drugs available, Suction available, Patient being monitored and Timeout performed Patient Re-evaluated:Patient Re-evaluated prior to induction Oxygen Delivery Method: Nasal cannula Preoxygenation: Pre-oxygenation with 100% oxygen Induction Type: IV induction

## 2022-06-01 NOTE — Plan of Care (Signed)
  Problem: Education: Goal: Knowledge of the prescribed therapeutic regimen will improve Outcome: Progressing   Problem: Health Behavior/Discharge Planning: Goal: Identification of resources available to assist in meeting health care needs will improve Outcome: Progressing   Problem: Skin Integrity: Goal: Demonstration of wound healing without infection will improve Outcome: Progressing   Problem: Urinary Elimination: Goal: Ability to avoid or minimize complications of infection will improve Outcome: Progressing   Problem: Education: Goal: Knowledge of General Education information will improve Description: Including pain rating scale, medication(s)/side effects and non-pharmacologic comfort measures Outcome: Progressing   Problem: Clinical Measurements: Goal: Ability to maintain clinical measurements within normal limits will improve Outcome: Progressing   Problem: Activity: Goal: Risk for activity intolerance will decrease Outcome: Progressing   Problem: Nutrition: Goal: Adequate nutrition will be maintained Outcome: Progressing   Problem: Elimination: Goal: Will not experience complications related to bowel motility Outcome: Progressing Goal: Will not experience complications related to urinary retention Outcome: Progressing   Problem: Pain Managment: Goal: General experience of comfort will improve Outcome: Progressing   Problem: Safety: Goal: Ability to remain free from injury will improve Outcome: Progressing   Problem: Skin Integrity: Goal: Risk for impaired skin integrity will decrease Outcome: Progressing   Problem: Health Behavior/Discharge Planning: Goal: Identification of resources available to assist in meeting health care needs will improve Outcome: Progressing   Problem: Skin Integrity: Goal: Demonstration of wound healing without infection will improve Outcome: Progressing   Problem: Urinary Elimination: Goal: Ability to avoid or minimize  complications of infection will improve Outcome: Progressing   Problem: Clinical Measurements: Goal: Ability to maintain clinical measurements within normal limits will improve Outcome: Progressing   Problem: Nutrition: Goal: Adequate nutrition will be maintained Outcome: Progressing   Problem: Elimination: Goal: Will not experience complications related to bowel motility Outcome: Progressing   Problem: Safety: Goal: Ability to remain free from injury will improve Outcome: Progressing   Problem: Pain Managment: Goal: General experience of comfort will improve Outcome: Progressing

## 2022-06-01 NOTE — Interval H&P Note (Signed)
History and Physical Interval Note:  06/01/2022 10:05 AM  Glenn Patel  has presented today for surgery, with the diagnosis of BACTOREMIA.  The various methods of treatment have been discussed with the patient and family. After consideration of risks, benefits and other options for treatment, the patient has consented to  Procedure(s): TRANSESOPHAGEAL ECHOCARDIOGRAM (TEE) (N/A) as a surgical intervention.  The patient's history has been reviewed, patient examined, no change in status, stable for surgery.  I have reviewed the patient's chart and labs.  Questions were answered to the patient's satisfaction.     [place]

## 2022-06-01 NOTE — Progress Notes (Signed)
[facility]    Date of Admission:  05/26/2022   Total days of antibiotics 8/day 6 since bacteremia clearance           ID: Glenn Patel is a 89 y.o. male with  klebsiella and enterococcal bacteremia realted to uit Principal Problem:   UTI (urinary tract infection) Active Problems:   Hypokalemia   Hyperlipidemia   BPH (benign prostatic hyperplasia)   Glaucoma   Hypophosphatemia    Subjective: Had TEE that ruled out endocarditis  Medications:   Chlorhexidine Gluconate Cloth  6 each Topical Daily   cycloSPORINE  1 drop Both Eyes BID   dorzolamide  1 drop Both Eyes BID   And   timolol  1 drop Both Eyes BID   finasteride  5 mg Oral q morning   ketorolac  1 drop Right Eye Daily   latanoprost  1 drop Both Eyes QHS   melatonin  5 mg Oral QHS   Netarsudil Dimesylate  1 drop Both Eyes QHS   polyvinyl alcohol  1 drop Both Eyes QHS   potassium chloride  40 mEq Oral Daily   simvastatin  40 mg Oral QPM   tamsulosin  0.4 mg Oral QHS    Objective: Vital signs in last 24 hours: Temp:  [97.3 F (36.3 C)-99 F (37.2 C)] 97.3 F (36.3 C) (03/01 1047) Pulse Rate:  [63-96] 64 (03/01 1200) Resp:  [14-19] 16 (03/01 1200) BP: (97-170)/(55-105) 142/85 (03/01 1200) SpO2:  [17 %-100 %] 99 % (03/01 1200) Weight:  [99.8 kg] 99.8 kg (03/01 0921) Physical Exam  Constitutional: He is oriented to person, place, and time. He appears well-developed and well-nourished. No distress.  HENT:  Mouth/Throat: Oropharynx is clear and moist. No oropharyngeal exudate.  Cardiovascular: Normal rate, regular rhythm and normal heart sounds. Exam reveals no gallop and no friction rub.  No murmur heard.  Pulmonary/Chest: Effort normal and breath sounds normal. No respiratory distress. He has no wheezes.  Abdominal: Soft. Bowel sounds are normal. He exhibits no distension. There is no tenderness.  Lymphadenopathy:  He has no cervical adenopathy.  Neurological: He is alert and  oriented to person, place, and time.  Skin: Skin is warm and dry. No rash noted. No erythema.  Psychiatric: He has a normal mood and affect. His behavior is normal.    Lab Results Recent Labs    05/31/22 0336 06/01/22 0439  WBC 6.9 7.2  HGB 8.7* 8.7*  HCT 28.1* 27.2*  NA 139 138  K 3.3* 3.4*  CL 109 111  CO2 24 22  BUN 10 9  CREATININE 0.96 1.04    Microbiology: 2/25 blood cx ngtd 2/24 blood cx + = amp S enterococcus Klebsiella pneumoniae      MIC    AMPICILLIN >=32 RESIST... Resistant    AMPICILLIN/SULBACTAM 4 SENSITIVE Sensitive    CEFEPIME <=0.12 SENS... Sensitive    CEFTAZIDIME <=1 SENSITIVE Sensitive    CEFTRIAXONE <=0.25 SENS... Sensitive    CIPROFLOXACIN <=0.25 SENS... Sensitive    GENTAMICIN <=1 SENSITIVE Sensitive    IMIPENEM <=0.25 SENS... Sensitive    PIP/TAZO 8 SENSITIVE Sensitive    TRIMETH/SULFA <=20 SENSIT... Sensitive    Studies/Results: ECHO TEE  Result Date: 06/01/2022    TRANSESOPHOGEAL ECHO REPORT   Patient Name:   Glenn Patel Date of Exam: 06/01/2022 Medical Rec #:  [id number]      Height:       72.0 in Accession #:  [id number]     Weight:       220.0 lb Date of Birth:  [birth year]      BSA:          2.219 m Patient Age:    12 years       BP:           162/106 mmHg Patient Gender: M              HR:           66 bpm. Exam Location:  Inpatient Procedure: Transesophageal Echo, Color Doppler and Cardiac Doppler Indications:     Bacteremia  History:         Patient has prior history of Echocardiogram examinations, most                  recent 05/28/2022. Risk Factors:Hypertension and Dyslipidemia.  Sonographer:     Raquel Sarna Senior RDCS Referring Phys:  HAO MENG Diagnosing Phys: Lyman Bishop MD PROCEDURE: After discussion of the risks and benefits of a TEE, an informed consent was obtained from the patient. The transesophogeal probe was passed without difficulty through the esophogus of the patient. Sedation performed by different physician. The patient was  monitored while under deep sedation. Anesthestetic sedation was provided intravenously by Anesthesiology: '334mg'$  of Propofol, '100mg'$  of Lidocaine. The patient developed no complications during the procedure.  IMPRESSIONS  1. Left ventricular ejection fraction, by estimation, is 60 to 65%. The left ventricle has normal function. There is mild left ventricular hypertrophy.  2. Right ventricular systolic function is normal. The right ventricular size is normal.  3. No left atrial/left atrial appendage thrombus was detected.  4. The mitral valve is grossly normal. Trivial mitral valve regurgitation.  5. The aortic valve is tricuspid. Aortic valve regurgitation trivial to mild. Aortic valve sclerosis is present, with no evidence of aortic valve stenosis. Conclusion(s)/Recommendation(s): No evidence of vegetation/infective endocarditis on this transesophageael echocardiogram. FINDINGS  Left Ventricle: Left ventricular ejection fraction, by estimation, is 60 to 65%. The left ventricle has normal function. The left ventricular internal cavity size was normal in size. There is mild left ventricular hypertrophy. Right Ventricle: The right ventricular size is normal. No increase in right ventricular wall thickness. Right ventricular systolic function is normal. Left Atrium: Left atrial size was normal in size. No left atrial/left atrial appendage thrombus was detected. Right Atrium: Right atrial size was normal in size. Pericardium: There is no evidence of pericardial effusion. Mitral Valve: The mitral valve is grossly normal. Trivial mitral valve regurgitation. Tricuspid Valve: The tricuspid valve is grossly normal. Tricuspid valve regurgitation is trivial. Aortic Valve: The aortic valve is tricuspid. Aortic valve regurgitation trivial to mild. Aortic valve sclerosis is present, with no evidence of aortic valve stenosis. Pulmonic Valve: The pulmonic valve was normal in structure. Pulmonic valve regurgitation is not visualized.  Aorta: The aortic root and ascending aorta are structurally normal, with no evidence of dilitation. There is minimal (Grade I) plaque involving the aortic arch and descending aorta. IAS/Shunts: No atrial level shunt detected by color flow Doppler. Additional Comments: Spectral Doppler performed. Lyman Bishop MD Electronically signed by Lyman Bishop MD Signature Date/Time: 06/01/2022/12:24:08 PM    Final      Assessment/Plan: Enterococcal bacteremia due to complicated uti = recommend to treat with amoxicillin 1gm TID x 7 more days.  Klebsiella bacteremia associated with uti = has finished 7 days of treatment. No further gram negative treatment needed  [facility] Pager: [phone]  06/01/2022, 12:56 PM

## 2022-06-01 NOTE — Transfer of Care (Signed)
Immediate Anesthesia Transfer of Care Note  Patient: Glenn Patel  Procedure(s) Performed: TRANSESOPHAGEAL ECHOCARDIOGRAM (TEE)  Patient Location: Endoscopy Unit  Anesthesia Type:MAC  Level of Consciousness: drowsy  Airway & Oxygen Therapy: Patient Spontanous Breathing and Patient connected to nasal cannula oxygen  Post-op Assessment: Report given to RN and Post -op Vital signs reviewed and stable  Post vital signs: Reviewed and stable  Last Vitals:  Vitals Value Taken Time  BP    Temp    Pulse    Resp    SpO2      Last Pain:  Vitals:   06/01/22 0921  TempSrc: Temporal  PainSc: 0-No pain         Complications: No notable events documented.

## 2022-06-01 NOTE — Discharge Summary (Signed)
Physician Discharge Summary  Glenn Patel [id number] DOB: 02-Aug-1933 DOA: 05/26/2022  PCP: Glenn Amel, MD  Admit date: 05/26/2022 Discharge date: 06/01/2022  Time spent: 33 minutes  Recommendations for Outpatient Follow-up:  Patient requires 7 more days of amoxicillin high-dose and will need to complete this for bacteremia Patient will require further follow-up with primary care physician for labs in about a week I would recommend close follow-up with Dr. Gloriann Patel given recent instrumentation of his genitourinary tract  Discharge Diagnoses:  MAIN problem for hospitalization   Urinary tract infection from recent instrumentation not septic on admission  Please see below for itemized issues addressed in [name]- refer to other progress notes for clarity if needed  Discharge Condition: Much improved  Diet recommendation: None  Filed Weights   05/26/22 0320 06/01/22 0921  Weight: 99.8 kg 99.8 kg    History of present illness:  89 year old white male Known osteoarthritis, skin cancer Nephrolithiasis with urinary retention and BPH-had a robotic water jet ablation of the prostate [for Nocturia] 05/21/2022 with Dr. Gloriann Patel and was d/c home subsequently   Glenn Patel ED 05/26/2022 Tmax 100 WBC 11 UA large leukocytes, CXR negative-admit for UTI Rx vancomycin and cefepime initially Blood culture = Enterococcus   2/26-TTE negative for endocarditis 06/01/2022-TEE negative for endocarditis  Hospital Course:  Urinary infection (not sepsis on admission) 2/2 Enterococcus faecalis, Klebsiella bacteremia from recent instrumentation of urinary tract - Patient was initially on ampicillin and Rocephin and patient was then discussed with infectious disease physician Dr. Baxter Flattery who recommended 7 more days of amoxicillin 1 g 3 times daily to complete full duration of therapy - Blood culture 2/25 NGTD - TEE performed on 06/01/2022 did not demonstrate any vegetation so no need for further  antibiotics  BPH, prior nephrolithiasis Robotic water jet ablation prostate 2/19 - Outpatient follow-up Dr. Gloriann Patel - Foley removed 2/26 under instruction of urology - Continues on Proscar 5 daily, Flomax 0.4 at bedtime   Hypokalemia -Replace with p.o. potassium 40 meq, needs labs in a week   hypophosphatemia - Resolved   BMI 29.8 - Exercise as best possible once recovers from this illness   Hyperlipidemia glaucoma overall stable    Discharge Exam: Vitals:   06/01/22 1118 06/01/22 1200  BP: 121/69 (!) 142/85  Pulse: 69 64  Resp: 14 16  Temp:    SpO2: 99% 99%    Subj on day of d/c   Awake coherent pleasant sitting up in chair just back from procedure  General Exam on discharge  EOMI NCAT no focal deficit no icterus no pallor Chest is clear no wheeze rales rhonchi Abdomen soft no rebound No lower extremity edema ROM intact Neurologically intact moving 4 limbs equally No rash.  Discharge Instructions   Discharge Instructions     Diet - low sodium heart healthy   Complete by: As directed    Discharge instructions   Complete by: As directed    You are diagnosed this admission with a urinary infection secondary to recent manipulation of your genitourinary tract Finish full course of antibiotics and do not stop until they are completely done You may take some fresh yogurt or fermented foods to replace the gut microbiome-alternatively you can get probiotic pills  It would be a good idea for you to follow-up with your primary physician in about a week and I will CC your urologist Dr. Gloriann Patel to ensure that he is aware that you were admitted and that he can follow-up in the outpatient setting  Best of luck at the park and with swimming   Increase activity slowly   Complete by: As directed       Allergies as of 06/01/2022   No Known Allergies      Medication List     STOP taking these medications    cephALEXin 500 MG capsule Commonly known as: KEFLEX    oxyCODONE 5 MG immediate release tablet Commonly known as: Oxy IR/ROXICODONE       TAKE these medications    amoxicillin 500 MG capsule Commonly known as: AMOXIL Take 2 capsules (1,000 mg total) by mouth 3 (three) times daily for 7 days.   bimatoprost 0.01 % Soln Commonly known as: LUMIGAN Place 1 drop into both eyes at bedtime.   cycloSPORINE 0.05 % ophthalmic emulsion Commonly known as: RESTASIS Place 1 drop into both eyes 2 (two) times daily.   dorzolamide-timolol 2-0.5 % ophthalmic solution Commonly known as: COSOPT Place 1 drop into both eyes 2 (two) times daily.   finasteride 5 MG tablet Commonly known as: PROSCAR Take 5 mg by mouth every morning.   ketorolac 0.5 % ophthalmic solution Commonly known as: ACULAR INSTILL 1 DROP INTO THE RIGHT EYE FOUR TIMES A DAY What changed: See the new instructions.   Rhopressa 0.02 % Soln Generic drug: Netarsudil Dimesylate Place 1 drop into both eyes at bedtime.   simvastatin 40 MG tablet Commonly known as: ZOCOR Take 40 mg by mouth every evening.   Systane 0.4-0.3 % Gel ophthalmic gel Generic drug: Polyethyl Glycol-Propyl Glycol Place 1 Application into both eyes at bedtime.   tamsulosin 0.4 MG Caps capsule Commonly known as: FLOMAX Take 0.4 mg by mouth at bedtime.       No Known Allergies    The results of significant diagnostics from this hospitalization (including imaging, microbiology, ancillary and laboratory) are listed below for reference.    Significant Diagnostic Studies: ECHO TEE  Result Date: 06/01/2022    TRANSESOPHOGEAL ECHO REPORT   Patient Name:   Glenn Patel Date of Exam: 06/01/2022 Medical Rec #:  [id number]      Height:       72.0 in Accession #:    [id number]     Weight:       220.0 lb Date of Birth:  [birth year]      BSA:          2.219 m Patient Age:    89 years       BP:           162/106 mmHg Patient Gender: M              HR:           66 bpm. Exam Location:  Inpatient Procedure:  Transesophageal Echo, Color Doppler and Cardiac Doppler Indications:     Bacteremia  History:         Patient has prior history of Echocardiogram examinations, most                  recent 05/28/2022. Risk Factors:Hypertension and Dyslipidemia.  Sonographer:     Raquel Sarna Senior RDCS Referring Phys:  HAO MENG Diagnosing Phys: Lyman Bishop MD PROCEDURE: After discussion of the risks and benefits of a TEE, an informed consent was obtained from the patient. The transesophogeal probe was passed without difficulty through the esophogus of the patient. Sedation performed by different physician. The patient was monitored while under deep sedation. Anesthestetic sedation was provided intravenously by Anesthesiology: '334mg'$  of Propofol,  $'100mg'p$  of Lidocaine. The patient developed no complications during the procedure.  IMPRESSIONS  1. Left ventricular ejection fraction, by estimation, is 60 to 65%. The left ventricle has normal function. There is mild left ventricular hypertrophy.  2. Right ventricular systolic function is normal. The right ventricular size is normal.  3. No left atrial/left atrial appendage thrombus was detected.  4. The mitral valve is grossly normal. Trivial mitral valve regurgitation.  5. The aortic valve is tricuspid. Aortic valve regurgitation trivial to mild. Aortic valve sclerosis is present, with no evidence of aortic valve stenosis. Conclusion(s)/Recommendation(s): No evidence of vegetation/infective endocarditis on this transesophageael echocardiogram. FINDINGS  Left Ventricle: Left ventricular ejection fraction, by estimation, is 60 to 65%. The left ventricle has normal function. The left ventricular internal cavity size was normal in size. There is mild left ventricular hypertrophy. Right Ventricle: The right ventricular size is normal. No increase in right ventricular wall thickness. Right ventricular systolic function is normal. Left Atrium: Left atrial size was normal in size. No left atrial/left  atrial appendage thrombus was detected. Right Atrium: Right atrial size was normal in size. Pericardium: There is no evidence of pericardial effusion. Mitral Valve: The mitral valve is grossly normal. Trivial mitral valve regurgitation. Tricuspid Valve: The tricuspid valve is grossly normal. Tricuspid valve regurgitation is trivial. Aortic Valve: The aortic valve is tricuspid. Aortic valve regurgitation trivial to mild. Aortic valve sclerosis is present, with no evidence of aortic valve stenosis. Pulmonic Valve: The pulmonic valve was normal in structure. Pulmonic valve regurgitation is not visualized. Aorta: The aortic root and ascending aorta are structurally normal, with no evidence of dilitation. There is minimal (Grade I) plaque involving the aortic arch and descending aorta. IAS/Shunts: No atrial level shunt detected by color flow Doppler. Additional Comments: Spectral Doppler performed. Lyman Bishop MD Electronically signed by Lyman Bishop MD Signature Date/Time: 06/01/2022/12:24:08 PM    Final    ECHOCARDIOGRAM COMPLETE  Result Date: 05/28/2022    ECHOCARDIOGRAM REPORT   Patient Name:   RODEL MAZZOLI Date of Exam: 05/28/2022 Medical Rec #:  [id number]      Height:       72.0 in Accession #:    [id number]     Weight:       220.0 lb Date of Birth:  [birth year]      BSA:          2.219 m Patient Age:    24 years       BP:           139/82 mmHg Patient Gender: M              HR:           78 bpm. Exam Location:  Inpatient Procedure: 2D Echo, Color Doppler, Cardiac Doppler and Intracardiac            Opacification Agent Indications:    Bacteremia  History:        Patient has no prior history of Echocardiogram examinations.                 Risk Factors:Dyslipidemia. CKD.  Sonographer:    Eartha Inch Referring Phys: [id number] [place]  Sonographer Comments: Technically difficult study due to poor echo windows and patient is obese. Image acquisition challenging due to patient body habitus and Image  acquisition challenging due to respiratory motion. IMPRESSIONS  1. Left ventricular ejection fraction, by estimation, is 60 to 65%. The left ventricle has normal function. The left  ventricle has no regional wall motion abnormalities. Left ventricular diastolic parameters are consistent with Grade I diastolic dysfunction (impaired relaxation).  2. Right ventricular systolic function is normal. The right ventricular size is normal. Tricuspid regurgitation signal is inadequate for assessing PA pressure.  3. The mitral valve is normal in structure. No evidence of mitral valve regurgitation. No evidence of mitral stenosis.  4. The aortic valve was not well visualized. Aortic valve regurgitation is not visualized.  5. Aortic dilatation noted. There is mild dilatation of the ascending aorta, measuring 40 mm. Conclusion(s)/Recommendation(s): No evidence of valvular vegetations on this transthoracic echocardiogram. Consider a transesophageal echocardiogram to exclude infective endocarditis if clinically indicated. FINDINGS  Left Ventricle: Left ventricular ejection fraction, by estimation, is 60 to 65%. The left ventricle has normal function. The left ventricle has no regional wall motion abnormalities. Definity contrast agent was given IV to delineate the left ventricular  endocardial borders. The left ventricular internal cavity size was normal in size. There is no left ventricular hypertrophy. Left ventricular diastolic parameters are consistent with Grade I diastolic dysfunction (impaired relaxation). Normal left ventricular filling pressure. Right Ventricle: The right ventricular size is normal. No increase in right ventricular wall thickness. Right ventricular systolic function is normal. Tricuspid regurgitation signal is inadequate for assessing PA pressure. Left Atrium: Left atrial size was normal in size. Right Atrium: Right atrial size was normal in size. Pericardium: There is no evidence of pericardial effusion.  Mitral Valve: The mitral valve is normal in structure. No evidence of mitral valve regurgitation. No evidence of mitral valve stenosis. Tricuspid Valve: The tricuspid valve is normal in structure. Tricuspid valve regurgitation is not demonstrated. No evidence of tricuspid stenosis. Aortic Valve: The aortic valve was not well visualized. Aortic valve regurgitation is not visualized. Pulmonic Valve: The pulmonic valve was normal in structure. Pulmonic valve regurgitation is not visualized. No evidence of pulmonic stenosis. Aorta: Aortic dilatation noted. There is mild dilatation of the ascending aorta, measuring 40 mm. Venous: The inferior vena cava was not well visualized. IAS/Shunts: No atrial level shunt detected by color flow Doppler.  LEFT VENTRICLE PLAX 2D LVIDd:         4.60 cm   Diastology LVIDs:         3.40 cm   LV e' medial:    6.85 cm/s LV PW:         1.00 cm   LV E/e' medial:  9.4 LV IVS:        1.10 cm   LV e' lateral:   6.09 cm/s LVOT diam:     2.00 cm   LV E/e' lateral: 10.6 LV SV:         90 LV SV Index:   41 LVOT Area:     3.14 cm  RIGHT VENTRICLE RV S prime:     14.50 cm/s TAPSE (M-mode): 2.2 cm LEFT ATRIUM             Index        RIGHT ATRIUM           Index LA diam:        3.40 cm 1.53 cm/m   RA Area:     15.00 cm LA Vol (A2C):   64.5 ml 29.07 ml/m  RA Volume:   34.00 ml  15.32 ml/m LA Vol (A4C):   54.7 ml 24.65 ml/m LA Biplane Vol: 64.3 ml 28.98 ml/m  AORTIC VALVE LVOT Vmax:   149.00 cm/s LVOT Vmean:  108.000 cm/s  LVOT VTI:    0.287 m  AORTA Ao Root diam: 3.40 cm Ao Asc diam:  4.00 cm MITRAL VALVE MV Area (PHT): 2.69 cm    SHUNTS MV Decel Time: 282 msec    Systemic VTI:  0.29 m MV E velocity: 64.50 cm/s  Systemic Diam: 2.00 cm MV A velocity: 83.70 cm/s MV E/A ratio:  0.77 Fransico Him MD Electronically signed by Fransico Him MD Signature Date/Time: 05/28/2022/5:18:51 PM    Final    DG Chest Port 1 View  Result Date: 05/26/2022 CLINICAL DATA:  Questionable sepsis. EXAM: PORTABLE CHEST  1 VIEW COMPARISON:  Portable chest 10/20/2020 FINDINGS: The heart size and mediastinal contours are stable with normal cardiac size and with aortic tortuosity and atherosclerosis. Both lungs are clear. The visualized skeletal structures are unremarkable. IMPRESSION: No active disease. Aortic atherosclerosis. Electronically Signed   By: Telford Nab M.D.   On: 05/26/2022 04:10    Microbiology: Recent Results (from the past 240 hour(s))  Urine Culture     Status: Abnormal   Collection Time: 05/26/22  3:16 AM   Specimen: Urine, Random  Result Value Ref Range Status   Specimen Description   Final    URINE, RANDOM Performed at [facility] [address] [place], [place] [address]    Special Requests   Final    URINE, CATHETERIZED Performed at [facility], [place] [address] [place], [place] [address]    Culture MULTIPLE SPECIES PRESENT, SUGGEST RECOLLECTION (A)  Final   Report Status 05/27/2022 FINAL  Final  Blood Culture (routine x 2)     Status: Abnormal   Collection Time: 05/26/22  3:27 AM   Specimen: BLOOD  Result Value Ref Range Status   Specimen Description   Final    BLOOD RIGHT ANTECUBITAL Performed at [facility] [address], [place], [place] [address]    Special Requests   Final    BOTTLES DRAWN AEROBIC AND ANAEROBIC Blood Culture adequate volume Performed at [facility] [address] [place], [place] [address]    Culture  Setup Time   Final    GRAM POSITIVE COCCI AEROBIC BOTTLE ONLY GRAM NEGATIVE RODS ANAEROBIC BOTTLE ONLY CRITICAL RESULT CALLED TO, READ BACK BY AND VERIFIED WITH: PHARMD E. JACKSON 05/26/22 @ 2341 BY AB    Culture (A)  Final    KLEBSIELLA PNEUMONIAE ENTEROCOCCUS FAECALIS SUSCEPTIBILITIES PERFORMED ON PREVIOUS CULTURE WITHIN THE LAST 5 DAYS. Performed at [facility], [place] [address], [place], [place] [address]    Report Status 05/29/2022 FINAL  Final   Organism ID,  Bacteria KLEBSIELLA PNEUMONIAE  Final      Susceptibility   Klebsiella pneumoniae - MIC*    AMPICILLIN >=32 RESISTANT Resistant     CEFEPIME <=0.12 SENSITIVE Sensitive     CEFTAZIDIME <=1 SENSITIVE Sensitive     CEFTRIAXONE <=0.25 SENSITIVE Sensitive     CIPROFLOXACIN <=0.25 SENSITIVE Sensitive     GENTAMICIN <=1 SENSITIVE Sensitive     IMIPENEM <=0.25 SENSITIVE Sensitive     TRIMETH/SULFA <=20 SENSITIVE Sensitive     AMPICILLIN/SULBACTAM 4 SENSITIVE Sensitive     PIP/TAZO 8 SENSITIVE Sensitive     * KLEBSIELLA PNEUMONIAE  Blood Culture ID Panel (Reflexed)     Status: Abnormal   Collection Time: 05/26/22  3:27 AM  Result Value Ref Range Status   Enterococcus faecalis DETECTED (A) NOT DETECTED Final    Comment: CRITICAL RESULT CALLED TO, READ BACK BY  AND VERIFIED WITH: PHARMD E. JACKSON 05/26/22 @ 2341 BY AB    Enterococcus Faecium NOT DETECTED NOT DETECTED Final   Listeria monocytogenes NOT DETECTED NOT DETECTED Final   Staphylococcus species NOT DETECTED NOT DETECTED Final   Staphylococcus aureus (BCID) NOT DETECTED NOT DETECTED Final   Staphylococcus epidermidis NOT DETECTED NOT DETECTED Final   Staphylococcus lugdunensis NOT DETECTED NOT DETECTED Final   Streptococcus species NOT DETECTED NOT DETECTED Final   Streptococcus agalactiae NOT DETECTED NOT DETECTED Final   Streptococcus pneumoniae NOT DETECTED NOT DETECTED Final   Streptococcus pyogenes NOT DETECTED NOT DETECTED Final   A.calcoaceticus-baumannii NOT DETECTED NOT DETECTED Final   Bacteroides fragilis NOT DETECTED NOT DETECTED Final   Enterobacterales NOT DETECTED NOT DETECTED Final   Enterobacter cloacae complex NOT DETECTED NOT DETECTED Final   Escherichia coli NOT DETECTED NOT DETECTED Final   Klebsiella aerogenes NOT DETECTED NOT DETECTED Final   Klebsiella oxytoca NOT DETECTED NOT DETECTED Final   Klebsiella pneumoniae NOT DETECTED NOT DETECTED Final   Proteus species NOT DETECTED NOT DETECTED Final    Salmonella species NOT DETECTED NOT DETECTED Final   Serratia marcescens NOT DETECTED NOT DETECTED Final   Haemophilus influenzae NOT DETECTED NOT DETECTED Final   Neisseria meningitidis NOT DETECTED NOT DETECTED Final   Pseudomonas aeruginosa NOT DETECTED NOT DETECTED Final   Stenotrophomonas maltophilia NOT DETECTED NOT DETECTED Final   Candida albicans NOT DETECTED NOT DETECTED Final   Candida auris NOT DETECTED NOT DETECTED Final   Candida glabrata NOT DETECTED NOT DETECTED Final   Candida krusei NOT DETECTED NOT DETECTED Final   Candida parapsilosis NOT DETECTED NOT DETECTED Final   Candida tropicalis NOT DETECTED NOT DETECTED Final   Cryptococcus neoformans/gattii NOT DETECTED NOT DETECTED Final   Vancomycin resistance NOT DETECTED NOT DETECTED Final    Comment: Performed at [facility], [address], [place], [place] [address]  Blood Culture (routine x 2)     Status: Abnormal   Collection Time: 05/26/22  3:33 AM   Specimen: BLOOD  Result Value Ref Range Status   Specimen Description   Final    BLOOD LEFT ANTECUBITAL Performed at [facility] [address] [place], [place] [address]    Special Requests   Final    BOTTLES DRAWN AEROBIC AND ANAEROBIC Blood Culture adequate volume Performed at [facility] [address] [place], [place] [address]    Culture  Setup Time   Final    GRAM POSITIVE COCCI IN BOTH AEROBIC AND ANAEROBIC BOTTLES CRITICAL VALUE NOTED.  VALUE IS CONSISTENT WITH PREVIOUSLY REPORTED AND CALLED VALUE. Performed at [facility], [place] [address] [place], [place] [address]    Culture ENTEROCOCCUS FAECALIS (A)  Final   Report Status 05/28/2022 FINAL  Final   Organism ID, Bacteria ENTEROCOCCUS FAECALIS  Final      Susceptibility   Enterococcus faecalis - MIC*    AMPICILLIN <=2 SENSITIVE Sensitive     VANCOMYCIN 1 SENSITIVE Sensitive     GENTAMICIN SYNERGY SENSITIVE Sensitive     * ENTEROCOCCUS  FAECALIS  Resp panel by RT-PCR (RSV, Flu A&B, Covid) Anterior Nasal Swab     Status: None   Collection Time: 05/26/22  3:55 AM   Specimen: Anterior Nasal Swab  Result Value Ref Range Status   SARS Coronavirus 2 by RT PCR NEGATIVE NEGATIVE Final    Comment: (NOTE) SARS-CoV-2 target nucleic acids are NOT DETECTED.  The SARS-CoV-2 RNA is generally  detectable in upper respiratory specimens during the acute phase of infection. The lowest concentration of SARS-CoV-2 viral copies this assay can detect is 138 copies/mL. A negative result does not preclude SARS-Cov-2 infection and should not be used as the sole basis for treatment or other patient management decisions. A negative result may occur with  improper specimen collection/handling, submission of specimen other than nasopharyngeal swab, presence of viral mutation(s) within the areas targeted by this assay, and inadequate number of viral copies(<138 copies/mL). A negative result must be combined with clinical observations, patient history, and epidemiological information. The expected result is Negative.  Fact Sheet for Patients:  EntrepreneurPulse.com.au  Fact Sheet for Healthcare Providers:  IncredibleEmployment.be  This test is no t yet approved or cleared by the [place] FDA and  has been authorized for detection and/or diagnosis of SARS-CoV-2 by FDA under an Emergency Use Authorization (EUA). This EUA will remain  in effect (meaning this test can be used) for the duration of the COVID-19 declaration under Section 564(b)(1) of the Act, 21 U.S.C.section 360bbb-3(b)(1), unless the authorization is terminated  or revoked sooner.       Influenza A by PCR NEGATIVE NEGATIVE Final   Influenza B by PCR NEGATIVE NEGATIVE Final    Comment: (NOTE) The Xpert Xpress SARS-CoV-2/FLU/RSV plus assay is intended as an aid in the diagnosis of influenza from Nasopharyngeal swab specimens and should not  be used as a sole basis for treatment. Nasal washings and aspirates are unacceptable for Xpert Xpress SARS-CoV-2/FLU/RSV testing.  Fact Sheet for Patients: EntrepreneurPulse.com.au  Fact Sheet for Healthcare Providers: IncredibleEmployment.be  This test is not yet approved or cleared by the [place] FDA and has been authorized for detection and/or diagnosis of SARS-CoV-2 by FDA under an Emergency Use Authorization (EUA). This EUA will remain in effect (meaning this test can be used) for the duration of the COVID-19 declaration under Section 564(b)(1) of the Act, 21 U.S.C. section 360bbb-3(b)(1), unless the authorization is terminated or revoked.     Resp Syncytial Virus by PCR NEGATIVE NEGATIVE Final    Comment: (NOTE) Fact Sheet for Patients: EntrepreneurPulse.com.au  Fact Sheet for Healthcare Providers: IncredibleEmployment.be  This test is not yet approved or cleared by the [place] FDA and has been authorized for detection and/or diagnosis of SARS-CoV-2 by FDA under an Emergency Use Authorization (EUA). This EUA will remain in effect (meaning this test can be used) for the duration of the COVID-19 declaration under Section 564(b)(1) of the Act, 21 U.S.C. section 360bbb-3(b)(1), unless the authorization is terminated or revoked.  Performed at [facility], [place] [address], [place], [place] [address]   Blood Culture ID Panel (Reflexed)     Status: Abnormal   Collection Time: 05/26/22 11:30 PM  Result Value Ref Range Status   Enterococcus faecalis NOT DETECTED NOT DETECTED Final   Enterococcus Faecium NOT DETECTED NOT DETECTED Final   Listeria monocytogenes NOT DETECTED NOT DETECTED Final   Staphylococcus species NOT DETECTED NOT DETECTED Final   Staphylococcus aureus (BCID) NOT DETECTED NOT DETECTED Final   Staphylococcus epidermidis NOT DETECTED NOT DETECTED Final    Staphylococcus lugdunensis NOT DETECTED NOT DETECTED Final   Streptococcus species NOT DETECTED NOT DETECTED Final   Streptococcus agalactiae NOT DETECTED NOT DETECTED Final   Streptococcus pneumoniae NOT DETECTED NOT DETECTED Final   Streptococcus pyogenes NOT DETECTED NOT DETECTED Final   A.calcoaceticus-baumannii NOT DETECTED NOT DETECTED Final   Bacteroides fragilis NOT DETECTED NOT DETECTED Final   Enterobacterales DETECTED (  A) NOT DETECTED Final    Comment: Enterobacterales represent a large order of gram negative bacteria, not a single organism. CRITICAL RESULT CALLED TO, READ BACK BY AND VERIFIED WITH: PHARMD E. JACKSON 05/26/22 @ 2341 BY AB    Enterobacter cloacae complex NOT DETECTED NOT DETECTED Final   Escherichia coli NOT DETECTED NOT DETECTED Final   Klebsiella aerogenes NOT DETECTED NOT DETECTED Final   Klebsiella oxytoca NOT DETECTED NOT DETECTED Final   Klebsiella pneumoniae DETECTED (A) NOT DETECTED Final    Comment: CRITICAL RESULT CALLED TO, READ BACK BY AND VERIFIED WITH: PHARMD E. JACKSON 05/26/22 @ 2341 BY AB    Proteus species NOT DETECTED NOT DETECTED Final   Salmonella species NOT DETECTED NOT DETECTED Final   Serratia marcescens NOT DETECTED NOT DETECTED Final   Haemophilus influenzae NOT DETECTED NOT DETECTED Final   Neisseria meningitidis NOT DETECTED NOT DETECTED Final   Pseudomonas aeruginosa NOT DETECTED NOT DETECTED Final   Stenotrophomonas maltophilia NOT DETECTED NOT DETECTED Final   Candida albicans NOT DETECTED NOT DETECTED Final   Candida auris NOT DETECTED NOT DETECTED Final   Candida glabrata NOT DETECTED NOT DETECTED Final   Candida krusei NOT DETECTED NOT DETECTED Final   Candida parapsilosis NOT DETECTED NOT DETECTED Final   Candida tropicalis NOT DETECTED NOT DETECTED Final   Cryptococcus neoformans/gattii NOT DETECTED NOT DETECTED Final   CTX-M ESBL NOT DETECTED NOT DETECTED Final   Carbapenem resistance IMP NOT DETECTED NOT DETECTED Final    Carbapenem resistance KPC NOT DETECTED NOT DETECTED Final   Carbapenem resistance NDM NOT DETECTED NOT DETECTED Final   Carbapenem resist OXA 48 LIKE NOT DETECTED NOT DETECTED Final   Carbapenem resistance VIM NOT DETECTED NOT DETECTED Final    Comment: Performed at [facility], [address] [place], [place] [address]  Culture, blood (Routine X 2) w Reflex to ID Panel     Status: None   Collection Time: 05/27/22  1:35 PM   Specimen: BLOOD  Result Value Ref Range Status   Specimen Description   Final    BLOOD SITE NOT SPECIFIED Performed at [facility] [address] [place], [place] [address]    Special Requests   Final    BOTTLES DRAWN AEROBIC AND ANAEROBIC Blood Culture adequate volume Performed at [facility] [address], [place], [place] [address]    Culture   Final    NO GROWTH 5 DAYS Performed at [facility], [place] [address], [place], [place] [address]    Report Status 06/01/2022 FINAL  Final  Culture, blood (Routine X 2) w Reflex to ID Panel     Status: None   Collection Time: 05/27/22  3:46 PM   Specimen: BLOOD  Result Value Ref Range Status   Specimen Description   Final    BLOOD SITE NOT SPECIFIED Performed at [facility] [address], [place], [place] [address]    Special Requests   Final    BOTTLES DRAWN AEROBIC ONLY Blood Culture adequate volume Performed at [facility] [address] [place], [place] [address]    Culture   Final    NO GROWTH 5 DAYS Performed at [facility], [place] [address] [place], [place] [address]    Report Status 06/01/2022 FINAL  Final     Labs: Basic Metabolic Panel: Recent Labs  Lab 05/26/22 0355 05/27/22 0333 05/31/22 0336 06/01/22 0439  NA 136 136 139  138  K 3.4* 4.0 3.3* 3.4*  CL 108 113* 109 111  CO2 20* 19* 24 22  GLUCOSE 166* 101* 106* 100*  BUN '12 11 10 9  '$ CREATININE 0.99 1.01 0.96 1.04  CALCIUM 8.7*  8.0* 8.5* 8.4*  MG 1.9  --   --  2.1  PHOS 1.3*  --   --   --    Liver Function Tests: Recent Labs  Lab 05/26/22 0355 05/27/22 0333  AST 18 13*  ALT 15 14  ALKPHOS 75 55  BILITOT 0.9 0.5  PROT 6.7 5.8*  ALBUMIN 3.4* 2.7*   No results for input(s): "LIPASE", "AMYLASE" in the last 168 hours. No results for input(s): "AMMONIA" in the last 168 hours. CBC: Recent Labs  Lab 05/26/22 0355 05/27/22 0333 05/31/22 0336 06/01/22 0439  WBC 11.1* 8.4 6.9 7.2  NEUTROABS 9.6*  --  3.9 4.3  HGB 10.0* 8.6* 8.7* 8.7*  HCT 32.1* 27.0* 28.1* 27.2*  MCV 79.7* 79.6* 79.6* 78.4*  PLT 227 185 224 225   Cardiac Enzymes: No results for input(s): "CKTOTAL", "CKMB", "CKMBINDEX", "TROPONINI" in the last 168 hours. BNP: BNP (last 3 results) No results for input(s): "BNP" in the last 8760 hours.  ProBNP (last 3 results) No results for input(s): "PROBNP" in the last 8760 hours.  CBG: No results for input(s): "GLUCAP" in the last 168 hours.     Signed:  Nita Sells MD   [facility] 06/01/2022, 1:18 PM

## 2022-06-01 NOTE — CV Procedure (Signed)
TRANSESOPHAGEAL ECHOCARDIOGRAM (TEE) NOTE  INDICATIONS: infective endocarditis  PROCEDURE:   Informed consent was obtained prior to the procedure. The risks, benefits and alternatives for the procedure were discussed and the patient comprehended these risks.  Risks include, but are not limited to, cough, sore throat, vomiting, nausea, somnolence, esophageal and stomach trauma or perforation, bleeding, low blood pressure, aspiration, pneumonia, infection, trauma to the teeth and death.    After a procedural time-out, the patient was given propofol for sedation by anesthesia. See their separate report.  The patient's heart rate, blood pressure, and oxygen saturation are monitored continuously during the procedure.The oropharynx was anesthetized with topical cetacaine.  The transesophageal probe was inserted in the esophagus and stomach without difficulty and multiple views were obtained.  The patient was kept under observation until the patient left the procedure room.  I was present face-to-face 100% of this time. The patient left the procedure room in stable condition.   Agitated microbubble saline contrast was not administered.  COMPLICATIONS:    There were no immediate complications.  Findings:  LEFT VENTRICLE: The left ventricular wall thickness is mildly increased.  The left ventricular cavity is normal in size. Wall motion is normal.  LVEF is 60-65%.  RIGHT VENTRICLE:  The right ventricle is normal in structure and function without any thrombus or masses.    LEFT ATRIUM:  The left atrium is normal in size without any thrombus or masses.  There is not spontaneous echo contrast ("smoke") in the left atrium consistent with a low flow state.  LEFT ATRIAL APPENDAGE:  The small left atrial appendage is free of any thrombus or masses. The appendage has single lobes. Pulse doppler indicates moderate flow in the appendage.  ATRIAL SEPTUM:  The atrial septum appears intact and is free of  thrombus and/or masses.  There is no evidence for interatrial shunting by color doppler.  RIGHT ATRIUM:  The right atrium is normal in size and function without any thrombus or masses.  MITRAL VALVE:  The mitral valve is normal in structure and function with  trivial  regurgitation.  There were no vegetations or stenosis.  AORTIC VALVE:  The aortic valve is trileaflet and the leaflet tips are sclerotic. There is  trivial to mild  central regurgitation.  There were no vegetations or stenosis  TRICUSPID VALVE:  The tricuspid valve is normal in structure and function with  no  regurgitation.  There were no vegetations or stenosis   PULMONIC VALVE:  The pulmonic valve is normal in structure and function with  no  regurgitation.  There were no vegetations or stenosis.   AORTIC ARCH, ASCENDING AND DESCENDING AORTA:  There was grade 1 Ron Parker et. Al, 1992) atherosclerosis of the ascending aorta, aortic arch, or proximal descending aorta.  12. PULMONARY VEINS: Anomalous pulmonary venous return was not noted.  13. PERICARDIUM: The pericardium appeared normal and non-thickened.  There is no pericardial effusion.  IMPRESSION:   No endocarditis Aortic valve sclerosis with trivial to mild AI No LAA thrombus Negative for PFO by color doppler LVEF 60-65%  RECOMMENDATIONS:     No evidence for endocarditis.  Time Spent Directly with the Patient:  30 minutes   Pixie Casino, MD, [facility], [place] Director of the Advanced Lipid Disorders &  Cardiovascular Risk Reduction Clinic Diplomate of the American Board of Clinical Lipidology Attending Cardiologist  Direct Dial: [phone]  Fax: [phone]  Website:  [email or web address]  Nadean Corwin  Lydell Moga 06/01/2022, 10:42 AM

## 2022-06-01 NOTE — Progress Notes (Incomplete)
PROGRESS NOTE   Glenn Patel  [id number] DOB: [birth year] DOA: 05/26/2022 PCP: Lujean Amel, MD  Brief Narrative:  89 year old white male Known osteoarthritis, skin cancer Nephrolithiasis with urinary retention and BPH-had a robotic water jet ablation of the prostate [for Nocturia] 05/21/2022 with Dr. Gloriann Loan and was d/c home subsequently  [facility] 05/26/2022 Tmax 100 WBC 11 UA large leukocytes, CXR negative-admit for UTI Rx vancomycin and cefepime initially Blood culture = Enterococcus  2/26-TTE negative for endocarditis  Hospital-Problem based course  Sepsis on admission 2/2 Enterococcus faecalis, Klebsiella bacteremia from recent instrumentation of urinary tract - Continue ampicillin and Rocephin or as per ID - Blood culture 2/25 NGTD - TEE scheduled at [facility] around 11:00 2/30 - PICC line and decision regarding antibiotics to follow  BPH, prior nephrolithiasis Robotic water jet ablation prostate 2/19 - Outpatient follow-up Dr. Gloriann Loan - Foley removed 2/26 under instruction of urology - Continues on Proscar 5 daily, Flomax 0.4 at bedtime  Hypokalemia -Replace with p.o. potassium 40 meq - Check a.m. mag as procedure scheduled tomorrow  hypophosphatemia - Resolved  BMI 29.8 - Exercise as best possible once recovers from this illness  Hyperlipidemia glaucoma overall stable  DVT prophylaxis: SCD Code Status: Full Family Communication: None Disposition:  Status is: Inpatient Remains inpatient appropriate because:   Requires further treatment of bacteremia    Subjective:  Well no distress no fever no n/v Looks great Wife/Daughter bedside  Objective: Vitals:   06/01/22 1057 06/01/22 1107 06/01/22 1118 06/01/22 1200  BP: (!) 97/55 108/74 121/69 (!) 142/85  Pulse: 63 75 69 64  Resp: '18 18 14 16  '$ Temp:      TempSrc:      SpO2: 98% 99% 99% 99%  Weight:      Height:        Intake/Output Summary (Last 24 hours) at 06/01/2022 1300 Last data  filed at 06/01/2022 1031 Gross per 24 hour  Intake 500 ml  Output 0 ml  Net 500 ml    Filed Weights   05/26/22 0320 06/01/22 0921  Weight: 99.8 kg 99.8 kg    Examination:  Eomi ncat no pallor no icterus Moderate dentition Cta b no added sound S1 s2 hydralazine started earlier she was no m, cannot appreciate any M Abd soft nt nd no rebound No LE edema Neuro intact no deficit  Data Reviewed: personally reviewed   CBC    Component Value Date/Time   WBC 7.2 06/01/2022 0439   RBC 3.47 (L) 06/01/2022 0439   HGB 8.7 (L) 06/01/2022 0439   HCT 27.2 (L) 06/01/2022 0439   PLT 225 06/01/2022 0439   MCV 78.4 (L) 06/01/2022 0439   MCH 25.1 (L) 06/01/2022 0439   MCHC 32.0 06/01/2022 0439   RDW 15.4 06/01/2022 0439   LYMPHSABS 2.2 06/01/2022 0439   MONOABS 0.6 06/01/2022 0439   EOSABS 0.1 06/01/2022 0439   BASOSABS 0.0 06/01/2022 0439      Latest Ref Rng & Units 06/01/2022    4:39 AM 05/31/2022    3:36 AM 05/27/2022    3:33 AM  CMP  Glucose 70 - 99 mg/dL 100  106  101   BUN 8 - 23 mg/dL '9  10  11   '$ Creatinine 0.61 - 1.24 mg/dL 1.04  0.96  1.01   Sodium 135 - 145 mmol/L 138  139  136   Potassium 3.5 - 5.1 mmol/L 3.4  3.3  4.0   Chloride 98 - 111 mmol/L 111  109  113   CO2 22 - 32 mmol/L '22  24  19   '$ Calcium 8.9 - 10.3 mg/dL 8.4  8.5  8.0   Total Protein 6.5 - 8.1 g/dL   5.8   Total Bilirubin 0.3 - 1.2 mg/dL   0.5   Alkaline Phos 38 - 126 U/L   55   AST 15 - 41 U/L   13   ALT 0 - 44 U/L   14      Radiology Studies: ECHO TEE  Result Date: 06/01/2022    TRANSESOPHOGEAL ECHO REPORT   Patient Name:   Glenn Patel Date of Exam: 06/01/2022 Medical Rec #:  [id number]      Height:       72.0 in Accession #:    [id number]     Weight:       220.0 lb Date of Birth:  1934/03/01      BSA:          2.219 m Patient Age:    44 years       BP:           162/106 mmHg Patient Gender: M              HR:           66 bpm. Exam Location:  Inpatient Procedure: Transesophageal Echo, Color Doppler and  Cardiac Doppler Indications:     Bacteremia  History:         Patient has prior history of Echocardiogram examinations, most                  recent 05/28/2022. Risk Factors:Hypertension and Dyslipidemia.  Sonographer:     Raquel Sarna Senior RDCS Referring Phys:  HAO MENG Diagnosing Phys: Lyman Bishop MD PROCEDURE: After discussion of the risks and benefits of a TEE, an informed consent was obtained from the patient. The transesophogeal probe was passed without difficulty through the esophogus of the patient. Sedation performed by different physician. The patient was monitored while under deep sedation. Anesthestetic sedation was provided intravenously by Anesthesiology: '334mg'$  of Propofol, '100mg'$  of Lidocaine. The patient developed no complications during the procedure.  IMPRESSIONS  1. Left ventricular ejection fraction, by estimation, is 60 to 65%. The left ventricle has normal function. There is mild left ventricular hypertrophy.  2. Right ventricular systolic function is normal. The right ventricular size is normal.  3. No left atrial/left atrial appendage thrombus was detected.  4. The mitral valve is grossly normal. Trivial mitral valve regurgitation.  5. The aortic valve is tricuspid. Aortic valve regurgitation trivial to mild. Aortic valve sclerosis is present, with no evidence of aortic valve stenosis. Conclusion(s)/Recommendation(s): No evidence of vegetation/infective endocarditis on this transesophageael echocardiogram. FINDINGS  Left Ventricle: Left ventricular ejection fraction, by estimation, is 60 to 65%. The left ventricle has normal function. The left ventricular internal cavity size was normal in size. There is mild left ventricular hypertrophy. Right Ventricle: The right ventricular size is normal. No increase in right ventricular wall thickness. Right ventricular systolic function is normal. Left Atrium: Left atrial size was normal in size. No left atrial/left atrial appendage thrombus was detected.  Right Atrium: Right atrial size was normal in size. Pericardium: There is no evidence of pericardial effusion. Mitral Valve: The mitral valve is grossly normal. Trivial mitral valve regurgitation. Tricuspid Valve: The tricuspid valve is grossly normal. Tricuspid valve regurgitation is trivial. Aortic Valve: The aortic valve is tricuspid. Aortic valve regurgitation trivial to mild. Aortic  valve sclerosis is present, with no evidence of aortic valve stenosis. Pulmonic Valve: The pulmonic valve was normal in structure. Pulmonic valve regurgitation is not visualized. Aorta: The aortic root and ascending aorta are structurally normal, with no evidence of dilitation. There is minimal (Grade I) plaque involving the aortic arch and descending aorta. IAS/Shunts: No atrial level shunt detected by color flow Doppler. Additional Comments: Spectral Doppler performed. Lyman Bishop MD Electronically signed by Lyman Bishop MD Signature Date/Time: 06/01/2022/12:24:08 PM    Final      Scheduled Meds:  Chlorhexidine Gluconate Cloth  6 each Topical Daily   cycloSPORINE  1 drop Both Eyes BID   dorzolamide  1 drop Both Eyes BID   And   timolol  1 drop Both Eyes BID   finasteride  5 mg Oral q morning   ketorolac  1 drop Right Eye Daily   latanoprost  1 drop Both Eyes QHS   melatonin  5 mg Oral QHS   Netarsudil Dimesylate  1 drop Both Eyes QHS   polyvinyl alcohol  1 drop Both Eyes QHS   potassium chloride  40 mEq Oral Daily   simvastatin  40 mg Oral QPM   tamsulosin  0.4 mg Oral QHS   Continuous Infusions:  ampicillin (OMNIPEN) IV 2 g (06/01/22 0827)   cefTRIAXone (ROCEPHIN)  IV 2 g (06/01/22 0053)     LOS: 6 days   Time spent: [place], MD [facility] To contact the attending provider between 7A-7P or the covering provider during after hours 7P-7A, please log into the web site [email or web address] and access using universal [place] password for that web site. If you do not have the  password, please call the hospital operator.  06/01/2022, 1:00 PM

## 2022-06-01 NOTE — Anesthesia Postprocedure Evaluation (Signed)
Anesthesia Post Note  Patient: Glenn Patel  Procedure(s) Performed: TRANSESOPHAGEAL ECHOCARDIOGRAM (TEE)     Patient location during evaluation: PACU Anesthesia Type: MAC Level of consciousness: awake and alert Pain management: pain level controlled Vital Signs Assessment: post-procedure vital signs reviewed and stable Respiratory status: spontaneous breathing, nonlabored ventilation, respiratory function stable and patient connected to nasal cannula oxygen Cardiovascular status: stable and blood pressure returned to baseline Postop Assessment: no apparent nausea or vomiting Anesthetic complications: no  No notable events documented.  Last Vitals:  Vitals:   06/01/22 1118 06/01/22 1200  BP: 121/69 (!) 142/85  Pulse: 69 64  Resp: 14 16  Temp:    SpO2: 99% 99%    Last Pain:  Vitals:   06/01/22 1118  TempSrc:   PainSc: 0-No pain                 Effie Berkshire

## 2022-06-01 NOTE — Anesthesia Preprocedure Evaluation (Addendum)
Anesthesia Evaluation  Patient identified by MRN, date of birth, ID band Patient awake    Reviewed: Allergy & Precautions, NPO status , Patient's Chart, lab work & pertinent test results  Airway Mallampati: I  TM Distance: >3 FB Neck ROM: Full    Dental  (+) Missing,    Pulmonary Current Smoker    + decreased breath sounds      Cardiovascular hypertension,  Rhythm:Regular Rate:Normal     Neuro/Psych negative neurological ROS  negative psych ROS   GI/Hepatic negative GI ROS, Neg liver ROS,,,  Endo/Other  negative endocrine ROS    Renal/GU Renal disease     Musculoskeletal   Abdominal   Peds  Hematology negative hematology ROS (+)   Anesthesia Other Findings   Reproductive/Obstetrics                             Anesthesia Physical Anesthesia Plan  ASA: 3  Anesthesia Plan: MAC   Post-op Pain Management: Minimal or no pain anticipated   Induction: Intravenous  PONV Risk Score and Plan: 0 and Propofol infusion  Airway Management Planned: Natural Airway and Nasal Cannula  Additional Equipment: None  Intra-op Plan:   Post-operative Plan:   Informed Consent: I have reviewed the patients History and Physical, chart, labs and discussed the procedure including the risks, benefits and alternatives for the proposed anesthesia with the patient or authorized representative who has indicated his/her understanding and acceptance.       Plan Discussed with: CRNA  Anesthesia Plan Comments:        Anesthesia Quick Evaluation

## 2022-06-04 ENCOUNTER — Encounter (HOSPITAL_COMMUNITY): Payer: Self-pay | Admitting: Internal Medicine

## 2022-06-04 DIAGNOSIS — N401 Enlarged prostate with lower urinary tract symptoms: Secondary | ICD-10-CM | POA: Diagnosis not present

## 2022-06-04 DIAGNOSIS — N3000 Acute cystitis without hematuria: Secondary | ICD-10-CM | POA: Diagnosis not present

## 2022-06-04 DIAGNOSIS — R338 Other retention of urine: Secondary | ICD-10-CM | POA: Diagnosis not present

## 2022-06-11 DIAGNOSIS — E876 Hypokalemia: Secondary | ICD-10-CM | POA: Diagnosis not present

## 2022-06-11 DIAGNOSIS — R5383 Other fatigue: Secondary | ICD-10-CM | POA: Diagnosis not present

## 2022-06-11 DIAGNOSIS — N401 Enlarged prostate with lower urinary tract symptoms: Secondary | ICD-10-CM | POA: Diagnosis not present

## 2022-06-11 DIAGNOSIS — N138 Other obstructive and reflux uropathy: Secondary | ICD-10-CM | POA: Diagnosis not present

## 2022-06-11 DIAGNOSIS — N489 Disorder of penis, unspecified: Secondary | ICD-10-CM | POA: Diagnosis not present

## 2022-07-03 DIAGNOSIS — H0102B Squamous blepharitis left eye, upper and lower eyelids: Secondary | ICD-10-CM | POA: Diagnosis not present

## 2022-07-03 DIAGNOSIS — Z961 Presence of intraocular lens: Secondary | ICD-10-CM | POA: Diagnosis not present

## 2022-07-03 DIAGNOSIS — H10502 Unspecified blepharoconjunctivitis, left eye: Secondary | ICD-10-CM | POA: Diagnosis not present

## 2022-07-03 DIAGNOSIS — H26492 Other secondary cataract, left eye: Secondary | ICD-10-CM | POA: Diagnosis not present

## 2022-07-03 DIAGNOSIS — H0102A Squamous blepharitis right eye, upper and lower eyelids: Secondary | ICD-10-CM | POA: Diagnosis not present

## 2022-07-03 DIAGNOSIS — H35351 Cystoid macular degeneration, right eye: Secondary | ICD-10-CM | POA: Diagnosis not present

## 2022-07-03 DIAGNOSIS — H401133 Primary open-angle glaucoma, bilateral, severe stage: Secondary | ICD-10-CM | POA: Diagnosis not present

## 2022-07-17 DIAGNOSIS — L820 Inflamed seborrheic keratosis: Secondary | ICD-10-CM | POA: Diagnosis not present

## 2022-07-17 DIAGNOSIS — Z85828 Personal history of other malignant neoplasm of skin: Secondary | ICD-10-CM | POA: Diagnosis not present

## 2022-07-17 DIAGNOSIS — D3617 Benign neoplasm of peripheral nerves and autonomic nervous system of trunk, unspecified: Secondary | ICD-10-CM | POA: Diagnosis not present

## 2022-07-17 DIAGNOSIS — L821 Other seborrheic keratosis: Secondary | ICD-10-CM | POA: Diagnosis not present

## 2022-07-17 DIAGNOSIS — D485 Neoplasm of uncertain behavior of skin: Secondary | ICD-10-CM | POA: Diagnosis not present

## 2022-07-20 DIAGNOSIS — B3742 Candidal balanitis: Secondary | ICD-10-CM | POA: Diagnosis not present

## 2022-07-20 DIAGNOSIS — N400 Enlarged prostate without lower urinary tract symptoms: Secondary | ICD-10-CM | POA: Diagnosis not present

## 2022-07-26 ENCOUNTER — Ambulatory Visit
Admission: RE | Admit: 2022-07-26 | Discharge: 2022-07-26 | Disposition: A | Discharge status: Home / Self Care | Payer: PPO | Source: Ambulatory Visit | Attending: Family Medicine | Admitting: Family Medicine

## 2022-07-26 ENCOUNTER — Ambulatory Visit (HOSPITAL_BASED_OUTPATIENT_CLINIC_OR_DEPARTMENT_OTHER)
Admission: RE | Admit: 2022-07-26 | Discharge: 2022-07-26 | Disposition: A | Discharge status: Home / Self Care | Payer: PPO | Source: Ambulatory Visit | Attending: Family Medicine | Admitting: Family Medicine

## 2022-07-26 ENCOUNTER — Other Ambulatory Visit (HOSPITAL_BASED_OUTPATIENT_CLINIC_OR_DEPARTMENT_OTHER): Payer: Self-pay | Admitting: Family Medicine

## 2022-07-26 ENCOUNTER — Other Ambulatory Visit: Payer: Self-pay | Admitting: Family Medicine

## 2022-07-26 DIAGNOSIS — Z79899 Other long term (current) drug therapy: Secondary | ICD-10-CM | POA: Diagnosis not present

## 2022-07-26 DIAGNOSIS — R0602 Shortness of breath: Secondary | ICD-10-CM

## 2022-07-26 DIAGNOSIS — I77810 Thoracic aortic ectasia: Secondary | ICD-10-CM | POA: Diagnosis not present

## 2022-07-26 DIAGNOSIS — I10 Essential (primary) hypertension: Secondary | ICD-10-CM | POA: Diagnosis not present

## 2022-07-26 DIAGNOSIS — I2510 Atherosclerotic heart disease of native coronary artery without angina pectoris: Secondary | ICD-10-CM | POA: Diagnosis not present

## 2022-07-26 DIAGNOSIS — I700 Atherosclerosis of aorta: Secondary | ICD-10-CM | POA: Diagnosis not present

## 2022-07-26 DIAGNOSIS — D649 Anemia, unspecified: Secondary | ICD-10-CM | POA: Diagnosis not present

## 2022-07-26 DIAGNOSIS — N1831 Chronic kidney disease, stage 3a: Secondary | ICD-10-CM | POA: Diagnosis not present

## 2022-07-26 DIAGNOSIS — E7800 Pure hypercholesterolemia, unspecified: Secondary | ICD-10-CM | POA: Diagnosis not present

## 2022-07-26 DIAGNOSIS — R5381 Other malaise: Secondary | ICD-10-CM | POA: Diagnosis not present

## 2022-07-26 DIAGNOSIS — Z0001 Encounter for general adult medical examination with abnormal findings: Secondary | ICD-10-CM | POA: Diagnosis not present

## 2022-07-26 MED ORDER — IOHEXOL 350 MG/ML SOLN
100.0000 mL | Freq: Once | INTRAVENOUS | Status: AC | PRN
  Administered 2022-07-26: 80 mL via INTRAVENOUS

## 2022-08-06 DIAGNOSIS — M6281 Muscle weakness (generalized): Secondary | ICD-10-CM | POA: Diagnosis not present

## 2022-08-06 DIAGNOSIS — R0602 Shortness of breath: Secondary | ICD-10-CM | POA: Diagnosis not present

## 2022-08-06 DIAGNOSIS — R2681 Unsteadiness on feet: Secondary | ICD-10-CM | POA: Diagnosis not present

## 2022-08-09 DIAGNOSIS — R0602 Shortness of breath: Secondary | ICD-10-CM | POA: Diagnosis not present

## 2022-08-09 DIAGNOSIS — M6281 Muscle weakness (generalized): Secondary | ICD-10-CM | POA: Diagnosis not present

## 2022-08-09 DIAGNOSIS — R2681 Unsteadiness on feet: Secondary | ICD-10-CM | POA: Diagnosis not present

## 2022-08-10 DIAGNOSIS — E611 Iron deficiency: Secondary | ICD-10-CM | POA: Diagnosis not present

## 2022-08-17 DIAGNOSIS — Z03818 Encounter for observation for suspected exposure to other biological agents ruled out: Secondary | ICD-10-CM | POA: Diagnosis not present

## 2022-08-17 DIAGNOSIS — R051 Acute cough: Secondary | ICD-10-CM | POA: Diagnosis not present

## 2022-08-17 DIAGNOSIS — J209 Acute bronchitis, unspecified: Secondary | ICD-10-CM | POA: Diagnosis not present

## 2022-08-20 DIAGNOSIS — R0602 Shortness of breath: Secondary | ICD-10-CM | POA: Diagnosis not present

## 2022-08-20 DIAGNOSIS — M6281 Muscle weakness (generalized): Secondary | ICD-10-CM | POA: Diagnosis not present

## 2022-08-20 DIAGNOSIS — R2681 Unsteadiness on feet: Secondary | ICD-10-CM | POA: Diagnosis not present

## 2022-08-23 DIAGNOSIS — R0602 Shortness of breath: Secondary | ICD-10-CM | POA: Diagnosis not present

## 2022-08-23 DIAGNOSIS — M6281 Muscle weakness (generalized): Secondary | ICD-10-CM | POA: Diagnosis not present

## 2022-08-23 DIAGNOSIS — R2681 Unsteadiness on feet: Secondary | ICD-10-CM | POA: Diagnosis not present

## 2022-08-28 DIAGNOSIS — M6281 Muscle weakness (generalized): Secondary | ICD-10-CM | POA: Diagnosis not present

## 2022-08-28 DIAGNOSIS — R2681 Unsteadiness on feet: Secondary | ICD-10-CM | POA: Diagnosis not present

## 2022-08-28 DIAGNOSIS — R0602 Shortness of breath: Secondary | ICD-10-CM | POA: Diagnosis not present

## 2022-08-30 DIAGNOSIS — M6281 Muscle weakness (generalized): Secondary | ICD-10-CM | POA: Diagnosis not present

## 2022-08-30 DIAGNOSIS — R0602 Shortness of breath: Secondary | ICD-10-CM | POA: Diagnosis not present

## 2022-08-30 DIAGNOSIS — R2681 Unsteadiness on feet: Secondary | ICD-10-CM | POA: Diagnosis not present

## 2022-09-03 DIAGNOSIS — M6281 Muscle weakness (generalized): Secondary | ICD-10-CM | POA: Diagnosis not present

## 2022-09-03 DIAGNOSIS — R0602 Shortness of breath: Secondary | ICD-10-CM | POA: Diagnosis not present

## 2022-09-03 DIAGNOSIS — R2681 Unsteadiness on feet: Secondary | ICD-10-CM | POA: Diagnosis not present

## 2022-09-04 DIAGNOSIS — E611 Iron deficiency: Secondary | ICD-10-CM | POA: Diagnosis not present

## 2022-09-04 DIAGNOSIS — Z0000 Encounter for general adult medical examination without abnormal findings: Secondary | ICD-10-CM | POA: Diagnosis not present

## 2022-09-04 DIAGNOSIS — R053 Chronic cough: Secondary | ICD-10-CM | POA: Diagnosis not present

## 2022-09-06 DIAGNOSIS — R2681 Unsteadiness on feet: Secondary | ICD-10-CM | POA: Diagnosis not present

## 2022-09-06 DIAGNOSIS — R0602 Shortness of breath: Secondary | ICD-10-CM | POA: Diagnosis not present

## 2022-09-06 DIAGNOSIS — M6281 Muscle weakness (generalized): Secondary | ICD-10-CM | POA: Diagnosis not present

## 2022-09-10 DIAGNOSIS — R0602 Shortness of breath: Secondary | ICD-10-CM | POA: Diagnosis not present

## 2022-09-10 DIAGNOSIS — R2681 Unsteadiness on feet: Secondary | ICD-10-CM | POA: Diagnosis not present

## 2022-09-10 DIAGNOSIS — M6281 Muscle weakness (generalized): Secondary | ICD-10-CM | POA: Diagnosis not present

## 2022-10-19 DIAGNOSIS — N485 Ulcer of penis: Secondary | ICD-10-CM | POA: Diagnosis not present

## 2022-10-19 DIAGNOSIS — R350 Frequency of micturition: Secondary | ICD-10-CM | POA: Diagnosis not present

## 2022-10-19 DIAGNOSIS — N401 Enlarged prostate with lower urinary tract symptoms: Secondary | ICD-10-CM | POA: Diagnosis not present

## 2022-10-19 DIAGNOSIS — R8279 Other abnormal findings on microbiological examination of urine: Secondary | ICD-10-CM | POA: Diagnosis not present

## 2022-10-19 DIAGNOSIS — R351 Nocturia: Secondary | ICD-10-CM | POA: Diagnosis not present

## 2022-11-09 DIAGNOSIS — H26492 Other secondary cataract, left eye: Secondary | ICD-10-CM | POA: Diagnosis not present

## 2022-11-09 DIAGNOSIS — Z961 Presence of intraocular lens: Secondary | ICD-10-CM | POA: Diagnosis not present

## 2022-11-09 DIAGNOSIS — H0102B Squamous blepharitis left eye, upper and lower eyelids: Secondary | ICD-10-CM | POA: Diagnosis not present

## 2022-11-09 DIAGNOSIS — H0102A Squamous blepharitis right eye, upper and lower eyelids: Secondary | ICD-10-CM | POA: Diagnosis not present

## 2022-11-09 DIAGNOSIS — H10502 Unspecified blepharoconjunctivitis, left eye: Secondary | ICD-10-CM | POA: Diagnosis not present

## 2022-11-09 DIAGNOSIS — H401133 Primary open-angle glaucoma, bilateral, severe stage: Secondary | ICD-10-CM | POA: Diagnosis not present

## 2022-11-09 DIAGNOSIS — H35351 Cystoid macular degeneration, right eye: Secondary | ICD-10-CM | POA: Diagnosis not present

## 2022-11-14 DIAGNOSIS — N3000 Acute cystitis without hematuria: Secondary | ICD-10-CM | POA: Diagnosis not present

## 2022-11-14 DIAGNOSIS — R8271 Bacteriuria: Secondary | ICD-10-CM | POA: Diagnosis not present

## 2022-11-21 ENCOUNTER — Other Ambulatory Visit: Payer: Self-pay | Admitting: Ophthalmology

## 2022-11-21 DIAGNOSIS — R866 Abnormal cytological findings in specimens from male genital organs: Secondary | ICD-10-CM | POA: Diagnosis not present

## 2022-11-21 DIAGNOSIS — D499 Neoplasm of unspecified behavior of unspecified site: Secondary | ICD-10-CM | POA: Diagnosis not present

## 2022-11-21 DIAGNOSIS — D074 Carcinoma in situ of penis: Secondary | ICD-10-CM | POA: Diagnosis not present

## 2022-11-29 DIAGNOSIS — D290 Benign neoplasm of penis: Secondary | ICD-10-CM | POA: Diagnosis not present

## 2022-12-12 DIAGNOSIS — G4733 Obstructive sleep apnea (adult) (pediatric): Secondary | ICD-10-CM | POA: Diagnosis not present

## 2023-01-07 DIAGNOSIS — R0789 Other chest pain: Secondary | ICD-10-CM | POA: Diagnosis not present

## 2023-01-07 DIAGNOSIS — I2510 Atherosclerotic heart disease of native coronary artery without angina pectoris: Secondary | ICD-10-CM | POA: Diagnosis not present

## 2023-01-07 DIAGNOSIS — Z23 Encounter for immunization: Secondary | ICD-10-CM | POA: Diagnosis not present

## 2023-01-07 DIAGNOSIS — I77810 Thoracic aortic ectasia: Secondary | ICD-10-CM | POA: Diagnosis not present

## 2023-01-07 DIAGNOSIS — E611 Iron deficiency: Secondary | ICD-10-CM | POA: Diagnosis not present

## 2023-01-07 DIAGNOSIS — N1831 Chronic kidney disease, stage 3a: Secondary | ICD-10-CM | POA: Diagnosis not present

## 2023-01-07 DIAGNOSIS — Z79899 Other long term (current) drug therapy: Secondary | ICD-10-CM | POA: Diagnosis not present

## 2023-01-07 DIAGNOSIS — I10 Essential (primary) hypertension: Secondary | ICD-10-CM | POA: Diagnosis not present

## 2023-01-08 DIAGNOSIS — D290 Benign neoplasm of penis: Secondary | ICD-10-CM | POA: Diagnosis not present

## 2023-01-22 DIAGNOSIS — L249 Irritant contact dermatitis, unspecified cause: Secondary | ICD-10-CM | POA: Diagnosis not present

## 2023-01-22 DIAGNOSIS — Z85828 Personal history of other malignant neoplasm of skin: Secondary | ICD-10-CM | POA: Diagnosis not present

## 2023-02-22 DIAGNOSIS — D290 Benign neoplasm of penis: Secondary | ICD-10-CM | POA: Diagnosis not present

## 2023-02-22 DIAGNOSIS — R351 Nocturia: Secondary | ICD-10-CM | POA: Diagnosis not present

## 2023-02-22 DIAGNOSIS — N401 Enlarged prostate with lower urinary tract symptoms: Secondary | ICD-10-CM | POA: Diagnosis not present

## 2023-04-15 ENCOUNTER — Ambulatory Visit: Payer: PPO | Attending: Cardiovascular Disease | Admitting: Cardiovascular Disease

## 2023-04-15 ENCOUNTER — Encounter: Payer: Self-pay | Admitting: Cardiovascular Disease

## 2023-04-15 VITALS — BP 134/82 | HR 62 | Ht 72.0 in | Wt 221.6 lb

## 2023-04-15 DIAGNOSIS — E785 Hyperlipidemia, unspecified: Secondary | ICD-10-CM | POA: Diagnosis not present

## 2023-04-15 NOTE — Assessment & Plan Note (Addendum)
 History of hyperlipidemia on statin therapy lipid profile performed 07/26/22 revealed a total cholesterol 110, LDL 52 and HDL 42.

## 2023-04-15 NOTE — Patient Instructions (Signed)

## 2023-04-15 NOTE — Progress Notes (Signed)
 04/15/2023 Glenn Patel   July 22, 1933  [id number]  Primary Physician Glenn Slater, MD Primary Cardiologist: Glenn JINNY Lesches MD Glenn Patel, Glenn Patel  HPI:  Glenn Patel is a 89 y.o.  moderately overweight married Caucasian male father of 3, grandfather of 6 grandchildren referred by Glenn Patel for cardiovascular evaluation after patient was seen in the emergency room in July for shortness of breath and weakness.  He is accompanied by his wife Glenn Patel today.  I last saw him in the office 01/25/2021.  He is retired from working in research officer, political party.  His risk factors include discontinued tobacco abuse and treated hyperlipidemia.  There is no family history of heart disease.  He is never had a heart attack or stroke.  He was seen in the ER 10/20/2020 and evaluated by Dr. Bernard  for shortness of breath and weakness.  Evaluation was unrevealing.  He denies chest pain or shortness of breath.  Does complain of some orthostatic symptoms when waking up in the morning.    Saw him 2 years ago he was admitted with urosepsis last year and had a TEE ruling out endocarditis.  He had normal LV systolic function without valvular abnormality.  He denies chest pain.   Current Meds  Medication Sig   bimatoprost (LUMIGAN) 0.01 % SOLN Place 1 drop into both eyes at bedtime.   cycloSPORINE  (RESTASIS ) 0.05 % ophthalmic emulsion Place 1 drop into both eyes 2 (two) times daily.   dorzolamide -timolol  (COSOPT ) 22.3-6.8 MG/ML ophthalmic solution Place 1 drop into both eyes 2 (two) times daily.   ketorolac  (ACULAR ) 0.5 % ophthalmic solution INSTILL 1 DROP INTO THE RIGHT EYE FOUR TIMES A DAY (Patient taking differently: Place 1 drop into the right eye daily.)   Polyethyl Glycol-Propyl Glycol (SYSTANE) 0.4-0.3 % GEL ophthalmic gel Place 1 Application into both eyes at bedtime.   RHOPRESSA 0.02 % SOLN Place 1 drop into both eyes at bedtime.   simvastatin  (ZOCOR ) 40 MG tablet Take 40 mg by mouth every evening.     No  Known Allergies  Social History   Socioeconomic History   Marital status: Married    Spouse name: Not on file   Number of children: Not on file   Years of education: Not on file   Highest education level: Not on file  Occupational History   Not on file  Tobacco Use   Smoking status: Some Days    Types: Cigarettes   Smokeless tobacco: Never  Vaping Use   Vaping status: Never Used  Substance and Sexual Activity   Alcohol  use: No   Drug use: No   Sexual activity: Not Currently  Other Topics Concern   Not on file  Social History Narrative   Not on file   Social Drivers of Health   Financial Resource Strain: Not on file  Food Insecurity: No Food Insecurity (05/26/2022)   Hunger Vital Sign    Worried About Running Out of Food in the Last Year: Never true    Ran Out of Food in the Last Year: Never true  Transportation Needs: No Transportation Needs (05/26/2022)   PRAPARE - Transportation    Lack of Transportation (Medical): No    Lack of Transportation (Non-Medical): No  Physical Activity: Not on file  Stress: Not on file  Social Connections: Not on file  Intimate Partner Violence: Not At Risk (05/26/2022)   Humiliation, Afraid, Rape, and Kick questionnaire    Fear of Current or Ex-Partner: No  Emotionally Abused: No    Physically Abused: No    Sexually Abused: No     Review of Systems: General: negative for chills, fever, night sweats or weight changes.  Cardiovascular: negative for chest pain, dyspnea on exertion, edema, orthopnea, palpitations, paroxysmal nocturnal dyspnea or shortness of breath Dermatological: negative for rash Respiratory: negative for cough or wheezing Urologic: negative for hematuria Abdominal: negative for nausea, vomiting, diarrhea, bright red blood per rectum, melena, or hematemesis Neurologic: negative for visual changes, syncope, or dizziness All other systems reviewed and are otherwise negative except as noted above.    Blood pressure  134/82, pulse 62, height 6' (1.829 m), weight 221 lb 9.6 oz (100.5 kg), SpO2 100%.  General appearance: alert and no distress Neck: no adenopathy, no carotid bruit, no JVD, supple, symmetrical, trachea midline, and thyroid  not enlarged, symmetric, no tenderness/mass/nodules Lungs: clear to auscultation bilaterally Heart: regular rate and rhythm, S1, S2 normal, no murmur, click, rub or gallop Extremities: extremities normal, atraumatic, no cyanosis or edema Pulses: 2+ and symmetric Skin: Skin color, texture, turgor normal. No rashes or lesions Neurologic: Grossly normal  EKG EKG Interpretation Date/Time:  Monday April 15 2023 11:42:34 EST Ventricular Rate:  62 PR Interval:  242 QRS Duration:  76 QT Interval:  392 QTC Calculation: 397 R Axis:   56  Text Interpretation: Sinus rhythm with 1st degree A-V block When compared with ECG of 26-May-2022 03:14, PREVIOUS ECG IS PRESENT Confirmed by Glenn Patel [phone]) on 04/15/2023 12:16:49 PM    ASSESSMENT AND PLAN:   Hyperlipidemia History of hyperlipidemia on statin therapy lipid profile performed 07/26/22 revealed a total cholesterol 110, LDL 52 and HDL 42.     Patel Glenn Court MD FACP,FACC,FAHA, [facility] 04/15/2023 12:21 PM

## 2023-06-10 DIAGNOSIS — G4733 Obstructive sleep apnea (adult) (pediatric): Secondary | ICD-10-CM | POA: Diagnosis not present

## 2023-07-08 DIAGNOSIS — H401133 Primary open-angle glaucoma, bilateral, severe stage: Secondary | ICD-10-CM | POA: Diagnosis not present

## 2023-07-08 DIAGNOSIS — H0102A Squamous blepharitis right eye, upper and lower eyelids: Secondary | ICD-10-CM | POA: Diagnosis not present

## 2023-07-08 DIAGNOSIS — H0102B Squamous blepharitis left eye, upper and lower eyelids: Secondary | ICD-10-CM | POA: Diagnosis not present

## 2023-07-08 DIAGNOSIS — H35351 Cystoid macular degeneration, right eye: Secondary | ICD-10-CM | POA: Diagnosis not present

## 2023-07-08 DIAGNOSIS — H26492 Other secondary cataract, left eye: Secondary | ICD-10-CM | POA: Diagnosis not present

## 2023-07-22 DIAGNOSIS — L820 Inflamed seborrheic keratosis: Secondary | ICD-10-CM | POA: Diagnosis not present

## 2023-07-22 DIAGNOSIS — L905 Scar conditions and fibrosis of skin: Secondary | ICD-10-CM | POA: Diagnosis not present

## 2023-07-22 DIAGNOSIS — Z85828 Personal history of other malignant neoplasm of skin: Secondary | ICD-10-CM | POA: Diagnosis not present

## 2023-07-22 DIAGNOSIS — L570 Actinic keratosis: Secondary | ICD-10-CM | POA: Diagnosis not present

## 2023-07-22 DIAGNOSIS — D485 Neoplasm of uncertain behavior of skin: Secondary | ICD-10-CM | POA: Diagnosis not present

## 2023-07-22 DIAGNOSIS — D0461 Carcinoma in situ of skin of right upper limb, including shoulder: Secondary | ICD-10-CM | POA: Diagnosis not present

## 2023-07-22 DIAGNOSIS — D0471 Carcinoma in situ of skin of right lower limb, including hip: Secondary | ICD-10-CM | POA: Diagnosis not present

## 2023-07-22 DIAGNOSIS — L821 Other seborrheic keratosis: Secondary | ICD-10-CM | POA: Diagnosis not present

## 2023-07-25 DIAGNOSIS — H401113 Primary open-angle glaucoma, right eye, severe stage: Secondary | ICD-10-CM | POA: Diagnosis not present

## 2023-08-12 DIAGNOSIS — Z85828 Personal history of other malignant neoplasm of skin: Secondary | ICD-10-CM | POA: Diagnosis not present

## 2023-08-12 DIAGNOSIS — D0471 Carcinoma in situ of skin of right lower limb, including hip: Secondary | ICD-10-CM | POA: Diagnosis not present

## 2023-10-08 DIAGNOSIS — Z0000 Encounter for general adult medical examination without abnormal findings: Secondary | ICD-10-CM | POA: Diagnosis not present

## 2023-10-08 DIAGNOSIS — Z79899 Other long term (current) drug therapy: Secondary | ICD-10-CM | POA: Diagnosis not present

## 2023-10-08 DIAGNOSIS — N1831 Chronic kidney disease, stage 3a: Secondary | ICD-10-CM | POA: Diagnosis not present

## 2023-10-08 DIAGNOSIS — I10 Essential (primary) hypertension: Secondary | ICD-10-CM | POA: Diagnosis not present

## 2023-10-08 DIAGNOSIS — R7301 Impaired fasting glucose: Secondary | ICD-10-CM | POA: Diagnosis not present

## 2023-10-08 DIAGNOSIS — Z23 Encounter for immunization: Secondary | ICD-10-CM | POA: Diagnosis not present

## 2023-10-08 DIAGNOSIS — E611 Iron deficiency: Secondary | ICD-10-CM | POA: Diagnosis not present

## 2023-10-08 DIAGNOSIS — E7800 Pure hypercholesterolemia, unspecified: Secondary | ICD-10-CM | POA: Diagnosis not present

## 2023-10-08 DIAGNOSIS — I2510 Atherosclerotic heart disease of native coronary artery without angina pectoris: Secondary | ICD-10-CM | POA: Diagnosis not present

## 2023-10-08 DIAGNOSIS — I77810 Thoracic aortic ectasia: Secondary | ICD-10-CM | POA: Diagnosis not present

## 2023-12-04 DIAGNOSIS — R051 Acute cough: Secondary | ICD-10-CM | POA: Diagnosis not present

## 2024-01-01 DIAGNOSIS — Z85828 Personal history of other malignant neoplasm of skin: Secondary | ICD-10-CM | POA: Diagnosis not present

## 2024-01-01 DIAGNOSIS — L304 Erythema intertrigo: Secondary | ICD-10-CM | POA: Diagnosis not present

## 2024-01-07 DIAGNOSIS — H0102A Squamous blepharitis right eye, upper and lower eyelids: Secondary | ICD-10-CM | POA: Diagnosis not present

## 2024-01-07 DIAGNOSIS — H401133 Primary open-angle glaucoma, bilateral, severe stage: Secondary | ICD-10-CM | POA: Diagnosis not present

## 2024-01-07 DIAGNOSIS — H0102B Squamous blepharitis left eye, upper and lower eyelids: Secondary | ICD-10-CM | POA: Diagnosis not present

## 2024-01-07 DIAGNOSIS — Z961 Presence of intraocular lens: Secondary | ICD-10-CM | POA: Diagnosis not present

## 2024-02-21 DIAGNOSIS — D290 Benign neoplasm of penis: Secondary | ICD-10-CM | POA: Diagnosis not present

## 2024-02-21 DIAGNOSIS — N401 Enlarged prostate with lower urinary tract symptoms: Secondary | ICD-10-CM | POA: Diagnosis not present

## 2024-02-21 DIAGNOSIS — R351 Nocturia: Secondary | ICD-10-CM | POA: Diagnosis not present
